# Patient Record
Sex: Female | Born: 1935 | Hispanic: No | Marital: Married | Smoking: Never smoker
Health system: Southern US, Community
[De-identification: ages and names within clinical notes are randomized; demographics above are authoritative.]

## PROBLEM LIST (undated history)

## (undated) DIAGNOSIS — Y92129 Unspecified place in nursing home as the place of occurrence of the external cause: Secondary | ICD-10-CM

## (undated) DIAGNOSIS — S069XAA Unspecified intracranial injury with loss of consciousness status unknown, initial encounter: Secondary | ICD-10-CM

## (undated) DIAGNOSIS — R001 Bradycardia, unspecified: Secondary | ICD-10-CM

## (undated) DIAGNOSIS — H409 Unspecified glaucoma: Secondary | ICD-10-CM

## (undated) DIAGNOSIS — M545 Low back pain, unspecified: Secondary | ICD-10-CM

## (undated) DIAGNOSIS — W19XXXA Unspecified fall, initial encounter: Secondary | ICD-10-CM

## (undated) DIAGNOSIS — K429 Umbilical hernia without obstruction or gangrene: Secondary | ICD-10-CM

## (undated) DIAGNOSIS — C439 Malignant melanoma of skin, unspecified: Secondary | ICD-10-CM

## (undated) DIAGNOSIS — I4891 Unspecified atrial fibrillation: Secondary | ICD-10-CM

## (undated) DIAGNOSIS — G629 Polyneuropathy, unspecified: Secondary | ICD-10-CM

## (undated) DIAGNOSIS — I10 Essential (primary) hypertension: Secondary | ICD-10-CM

## (undated) DIAGNOSIS — H9190 Unspecified hearing loss, unspecified ear: Secondary | ICD-10-CM

## (undated) DIAGNOSIS — E119 Type 2 diabetes mellitus without complications: Secondary | ICD-10-CM

## (undated) DIAGNOSIS — F329 Major depressive disorder, single episode, unspecified: Secondary | ICD-10-CM

## (undated) DIAGNOSIS — S069X9A Unspecified intracranial injury with loss of consciousness of unspecified duration, initial encounter: Secondary | ICD-10-CM

## (undated) DIAGNOSIS — F0390 Unspecified dementia without behavioral disturbance: Secondary | ICD-10-CM

## (undated) DIAGNOSIS — F32A Depression, unspecified: Secondary | ICD-10-CM

## (undated) DIAGNOSIS — Z973 Presence of spectacles and contact lenses: Secondary | ICD-10-CM

## (undated) DIAGNOSIS — M791 Myalgia, unspecified site: Secondary | ICD-10-CM

## (undated) HISTORY — DX: Bradycardia, unspecified: R00.1

## (undated) HISTORY — DX: Unspecified atrial fibrillation: I48.91

## (undated) HISTORY — DX: Umbilical hernia without obstruction or gangrene: K42.9

## (undated) HISTORY — DX: Low back pain, unspecified: M54.50

## (undated) HISTORY — DX: Low back pain: M54.5

## (undated) HISTORY — PX: SKIN CANCER EXCISION: SHX779

## (undated) HISTORY — DX: Myalgia, unspecified site: M79.10

## (undated) HISTORY — DX: Unspecified glaucoma: H40.9

## (undated) HISTORY — DX: Polyneuropathy, unspecified: G62.9

---

## 1958-01-22 DIAGNOSIS — C439 Malignant melanoma of skin, unspecified: Secondary | ICD-10-CM

## 1958-01-22 HISTORY — DX: Malignant melanoma of skin, unspecified: C43.9

## 1999-11-15 ENCOUNTER — Other Ambulatory Visit: Admission: RE | Admit: 1999-11-15 | Discharge: 1999-11-15 | Payer: Self-pay | Admitting: Family Medicine

## 2000-11-15 ENCOUNTER — Other Ambulatory Visit: Admission: RE | Admit: 2000-11-15 | Discharge: 2000-11-15 | Payer: Self-pay | Admitting: Family Medicine

## 2007-08-08 ENCOUNTER — Emergency Department (HOSPITAL_COMMUNITY): Admission: EM | Admit: 2007-08-08 | Discharge: 2007-08-09 | Payer: Self-pay | Admitting: Emergency Medicine

## 2011-12-06 ENCOUNTER — Other Ambulatory Visit (HOSPITAL_COMMUNITY): Payer: Self-pay

## 2011-12-06 DIAGNOSIS — R9431 Abnormal electrocardiogram [ECG] [EKG]: Secondary | ICD-10-CM

## 2011-12-06 DIAGNOSIS — I10 Essential (primary) hypertension: Secondary | ICD-10-CM

## 2011-12-11 ENCOUNTER — Ambulatory Visit (HOSPITAL_COMMUNITY): Payer: Self-pay

## 2012-01-01 ENCOUNTER — Emergency Department (HOSPITAL_COMMUNITY): Payer: Medicare Other

## 2012-01-01 ENCOUNTER — Emergency Department (HOSPITAL_COMMUNITY)
Admission: EM | Admit: 2012-01-01 | Discharge: 2012-01-01 | Disposition: A | Discharge status: Home / Self Care | Payer: Medicare Other | Attending: Emergency Medicine | Admitting: Emergency Medicine

## 2012-01-01 ENCOUNTER — Encounter (HOSPITAL_COMMUNITY): Payer: Self-pay

## 2012-01-01 DIAGNOSIS — W010XXA Fall on same level from slipping, tripping and stumbling without subsequent striking against object, initial encounter: Secondary | ICD-10-CM | POA: Insufficient documentation

## 2012-01-01 DIAGNOSIS — W19XXXA Unspecified fall, initial encounter: Secondary | ICD-10-CM

## 2012-01-01 DIAGNOSIS — Z859 Personal history of malignant neoplasm, unspecified: Secondary | ICD-10-CM | POA: Insufficient documentation

## 2012-01-01 DIAGNOSIS — E119 Type 2 diabetes mellitus without complications: Secondary | ICD-10-CM | POA: Insufficient documentation

## 2012-01-01 DIAGNOSIS — Y929 Unspecified place or not applicable: Secondary | ICD-10-CM | POA: Insufficient documentation

## 2012-01-01 DIAGNOSIS — I10 Essential (primary) hypertension: Secondary | ICD-10-CM | POA: Insufficient documentation

## 2012-01-01 DIAGNOSIS — Y9301 Activity, walking, marching and hiking: Secondary | ICD-10-CM | POA: Insufficient documentation

## 2012-01-01 DIAGNOSIS — D689 Coagulation defect, unspecified: Secondary | ICD-10-CM | POA: Insufficient documentation

## 2012-01-01 DIAGNOSIS — S0101XA Laceration without foreign body of scalp, initial encounter: Secondary | ICD-10-CM

## 2012-01-01 HISTORY — DX: Malignant melanoma of skin, unspecified: C43.9

## 2012-01-01 HISTORY — DX: Essential (primary) hypertension: I10

## 2012-01-01 HISTORY — DX: Type 2 diabetes mellitus without complications: E11.9

## 2012-01-01 MED ORDER — LIDOCAINE-EPINEPHRINE-TETRACAINE (LET) SOLUTION
3.0000 mL | Freq: Once | NASAL | Status: AC
  Administered 2012-01-01: 3 mL via TOPICAL
  Filled 2012-01-01: qty 3

## 2012-01-01 NOTE — ED Notes (Signed)
PTAR contacted 

## 2012-01-01 NOTE — ED Notes (Signed)
ZOX:[id number]<AV> Expected date:01/01/12<BR> Expected time: 4:02 PM<BR> Means of arrival:Ambulance<BR> Comments:<BR> 89yoF/Fall

## 2012-01-01 NOTE — ED Provider Notes (Signed)
History     CSN: [id number]  Arrival date & time 01/01/12  1613   First MD Initiated Contact with Patient 01/01/12 1743      Chief Complaint  Patient presents with  . Fall    (Consider location/radiation/quality/duration/timing/severity/associated sxs/prior treatment) HPI  Patient reports she was visiting her husband and  she was getting him some water and she tripped on her husband feet that were sticking off the end of a recliner when she walked by which made her lose her balance and she fell flat on her back. She states she hit her head but she did not have any loss of consciousness. She states she had a lot of bleeding because she is on xarelto. She denies any headache or any pain. They have been putting ice packs on it which has stopped the bleeding. She denies nausea, vomiting, blurred vision.  Last tetanus is less than 5 years ago  PCP Dr. Frederik Pear  Past Medical History  Diagnosis Date  . Diabetes mellitus without complication   . Hypertension   . Melanoma     History reviewed. No pertinent past surgical history.  No family history on file.  History  Substance Use Topics  . Smoking status: Never Smoker   . Smokeless tobacco: Never Used  . Alcohol Use: No   Lives in assisted living at [facility] for the past 2 months Uses a walker now b/o a fall and hip pain.    OB History    Grav Para Term Preterm Abortions TAB SAB Ect Mult Living                  Review of Systems  All other systems reviewed and are negative.    Allergies  Review of patient's allergies indicates not on file.  Home Medications   Previous Medications   HYDROCODONE-ACETAMINOPHEN (NORCO/VICODIN) 5-325 MG PER TABLET    Take 1 tablet by mouth every 4 (four) hours as needed. Severe pain   IBUPROFEN (ADVIL,MOTRIN) 200 MG TABLET    Take 200 mg by mouth every 6 (six) hours as needed. Pain   METOPROLOL SUCCINATE (TOPROL-XL) 25 MG 24 HR TABLET    Take 25 mg by mouth daily.  xarelto per  daughter  BP 158/83  Pulse 84  Temp 97.6 F (36.4 C) (Oral)  Resp 16  SpO2 96%  Vital signs normal except hypertension   Physical Exam  Nursing note and vitals reviewed. Constitutional: She is oriented to person, place, and time. She appears well-developed and well-nourished.  Non-toxic appearance. She does not appear ill. No distress.  HENT:  Head: Normocephalic.  Right Ear: External ear normal.  Left Ear: External ear normal.  Nose: Nose normal. No mucosal edema or rhinorrhea.  Mouth/Throat: Oropharynx is clear and moist and mucous membranes are normal. No dental abscesses or uvula swelling.       Patient has a half centimeter laceration of her posterior scalp with some blood in the hair. There is bruising surrounding the laceration. There is no active bleeding at this time.  Eyes: Conjunctivae normal and EOM are normal. Pupils are equal, round, and reactive to light.  Neck: Normal range of motion and full passive range of motion without pain. Neck supple.       Neck is nontender and she moves her head freely during conversation  Pulmonary/Chest: Effort normal. No respiratory distress. She has no rhonchi. She exhibits no tenderness and no crepitus.  Abdominal: Soft. Normal appearance and bowel sounds  are normal. There is no tenderness.  Musculoskeletal: Normal range of motion. She exhibits no edema and no tenderness.       Moves all extremities well. She has no pain on flexion extension of her knees, hips, elbows or shoulders. She has no pain in the long bones.  Neurological: She is alert and oriented to person, place, and time. She has normal strength. No cranial nerve deficit.  Skin: Skin is warm, dry and intact. No rash noted. No erythema. No pallor.  Psychiatric: She has a normal mood and affect. Her speech is normal and behavior is normal. Her mood appears not anxious.    ED Course  Procedures (including critical care time)   LACERATION REPAIR Performed by: Emonee Winkowski  L Authorized by: Ward Givens Consent: Verbal consent obtained. Risks and benefits: risks, benefits and alternatives were discussed Consent given by: patient Patient identity confirmed: provided demographic data Prepped and Draped in normal sterile fashion Wound explored  Laceration Location:  Posterior scalp  Laceration Length: 0.5 cm  No Foreign Bodies seen or palpated  LET applied   Amount of cleaning: standard  Skin closure: staple  Number of sutures: 1    Patient tolerance: Patient tolerated the procedure well with no immediate complications.   Ct Head Wo Contrast  01/01/2012  *RADIOLOGY REPORT*  Clinical Data: Fall.  Occipital abrasion.  CT HEAD WITHOUT CONTRAST  Technique:  Contiguous axial images were obtained from the base of the skull through the vertex without contrast.  Comparison: 08/08/2007.  Findings: Midline and left parietal scalp hematoma is present. There is no underlying skull fracture.  The No mass lesion, mass effect, midline shift, hydrocephalus, hemorrhage.  No acute territorial cortical ischemia/infarct. Atrophy and chronic ischemic white matter disease is present.  Severe intracranial atherosclerosis.  Calvarium intact.  Paranasal sinuses are within normal limits.  IMPRESSION:  1.  Atrophy and chronic ischemic white matter disease without acute intracranial abnormality. 2.  Midline and left parietal scalp hematoma and contusion.   Original Report Authenticated By: Andreas Newport, M.D.      1. Fall   2. Laceration of scalp   3. Coagulopathy    Plan discharge  Devoria Albe, MD, FACEP   MDM          Ward Givens, MD 01/01/12 2045

## 2012-01-01 NOTE — ED Notes (Signed)
Pt BIB EMS. Pt is from [facility]. Pt was downstairs visiting her husband and tripped and fell and struck the back her head. Pt denies LOC, neck or back pain. Pt a/o x 3 to baseline. Pt has small lac to back of head. Bleeding controlled. Minimal swelling to area. Pt ambulated to gurney.

## 2012-03-08 ENCOUNTER — Encounter (HOSPITAL_COMMUNITY): Payer: Self-pay

## 2012-03-08 ENCOUNTER — Emergency Department (HOSPITAL_COMMUNITY): Payer: Medicare Other

## 2012-03-08 ENCOUNTER — Inpatient Hospital Stay (HOSPITAL_COMMUNITY): Payer: Medicare Other

## 2012-03-08 ENCOUNTER — Inpatient Hospital Stay (HOSPITAL_COMMUNITY)
Admission: EM | Admit: 2012-03-08 | Discharge: 2012-03-17 | DRG: 085 | Disposition: A | Discharge status: Skilled Nursing Facility | Payer: Medicare Other | Attending: Family Medicine | Admitting: Family Medicine

## 2012-03-08 DIAGNOSIS — Z9181 History of falling: Secondary | ICD-10-CM

## 2012-03-08 DIAGNOSIS — E119 Type 2 diabetes mellitus without complications: Secondary | ICD-10-CM | POA: Diagnosis present

## 2012-03-08 DIAGNOSIS — G9349 Other encephalopathy: Secondary | ICD-10-CM | POA: Diagnosis present

## 2012-03-08 DIAGNOSIS — F0390 Unspecified dementia without behavioral disturbance: Secondary | ICD-10-CM | POA: Diagnosis present

## 2012-03-08 DIAGNOSIS — N289 Disorder of kidney and ureter, unspecified: Secondary | ICD-10-CM | POA: Diagnosis present

## 2012-03-08 DIAGNOSIS — I6200 Nontraumatic subdural hemorrhage, unspecified: Secondary | ICD-10-CM

## 2012-03-08 DIAGNOSIS — E860 Dehydration: Secondary | ICD-10-CM | POA: Diagnosis present

## 2012-03-08 DIAGNOSIS — IMO0002 Reserved for concepts with insufficient information to code with codable children: Secondary | ICD-10-CM | POA: Diagnosis present

## 2012-03-08 DIAGNOSIS — R4182 Altered mental status, unspecified: Secondary | ICD-10-CM

## 2012-03-08 DIAGNOSIS — Z79899 Other long term (current) drug therapy: Secondary | ICD-10-CM

## 2012-03-08 DIAGNOSIS — S0003XA Contusion of scalp, initial encounter: Secondary | ICD-10-CM | POA: Diagnosis present

## 2012-03-08 DIAGNOSIS — S065X9A Traumatic subdural hemorrhage with loss of consciousness of unspecified duration, initial encounter: Secondary | ICD-10-CM | POA: Diagnosis present

## 2012-03-08 DIAGNOSIS — Y92129 Unspecified place in nursing home as the place of occurrence of the external cause: Secondary | ICD-10-CM

## 2012-03-08 DIAGNOSIS — I10 Essential (primary) hypertension: Secondary | ICD-10-CM | POA: Diagnosis present

## 2012-03-08 DIAGNOSIS — S065X0A Traumatic subdural hemorrhage without loss of consciousness, initial encounter: Principal | ICD-10-CM | POA: Diagnosis present

## 2012-03-08 DIAGNOSIS — E876 Hypokalemia: Secondary | ICD-10-CM | POA: Diagnosis present

## 2012-03-08 DIAGNOSIS — Y9210 Unspecified residential institution as the place of occurrence of the external cause: Secondary | ICD-10-CM | POA: Diagnosis present

## 2012-03-08 DIAGNOSIS — Z66 Do not resuscitate: Secondary | ICD-10-CM | POA: Diagnosis present

## 2012-03-08 DIAGNOSIS — S0180XA Unspecified open wound of other part of head, initial encounter: Secondary | ICD-10-CM | POA: Diagnosis present

## 2012-03-08 DIAGNOSIS — W19XXXA Unspecified fall, initial encounter: Secondary | ICD-10-CM

## 2012-03-08 DIAGNOSIS — F0781 Postconcussional syndrome: Secondary | ICD-10-CM | POA: Diagnosis present

## 2012-03-08 HISTORY — DX: Unspecified dementia, unspecified severity, without behavioral disturbance, psychotic disturbance, mood disturbance, and anxiety: F03.90

## 2012-03-08 LAB — CBC WITH DIFFERENTIAL/PLATELET
Eosinophils Absolute: 0 10*3/uL (ref 0.0–0.7)
Eosinophils Relative: 0 % (ref 0–5)
Lymphs Abs: 0.6 10*3/uL — ABNORMAL LOW (ref 0.7–4.0)
MCH: 28.8 pg (ref 26.0–34.0)
MCV: 83.3 fL (ref 78.0–100.0)
Monocytes Relative: 5 % (ref 3–12)
Platelets: 179 10*3/uL (ref 150–400)
RBC: 4.97 MIL/uL (ref 3.87–5.11)

## 2012-03-08 LAB — URINALYSIS, ROUTINE W REFLEX MICROSCOPIC
Ketones, ur: 15 mg/dL — AB
Leukocytes, UA: NEGATIVE
Nitrite: NEGATIVE
Protein, ur: 100 mg/dL — AB

## 2012-03-08 LAB — PROTIME-INR: Prothrombin Time: 12.3 seconds (ref 11.6–15.2)

## 2012-03-08 LAB — COMPREHENSIVE METABOLIC PANEL
BUN: 12 mg/dL (ref 6–23)
Calcium: 10.3 mg/dL (ref 8.4–10.5)
GFR calc Af Amer: >90 mL/min (ref >90)
Glucose, Bld: 178 mg/dL — ABNORMAL HIGH (ref 70–99)
Sodium: 131 mEq/L — ABNORMAL LOW (ref 135–145)
Total Protein: 7.5 g/dL (ref 6.0–8.3)

## 2012-03-08 LAB — APTT: aPTT: 23 seconds — ABNORMAL LOW (ref 24–37)

## 2012-03-08 MED ORDER — POTASSIUM CHLORIDE 10 MEQ/100ML IV SOLN
10.0000 meq | INTRAVENOUS | Status: AC
  Administered 2012-03-08 (×2): 10 meq via INTRAVENOUS
  Filled 2012-03-08 (×2): qty 100

## 2012-03-08 MED ORDER — SODIUM CHLORIDE 0.9 % IV SOLN
INTRAVENOUS | Status: DC
  Administered 2012-03-08 – 2012-03-10 (×2): via INTRAVENOUS

## 2012-03-08 MED ORDER — ACETAMINOPHEN 325 MG PO TABS: 650.0000 mg | ORAL_TABLET | Freq: Four times a day (QID) | ORAL | Status: DC | PRN

## 2012-03-08 MED ORDER — ONDANSETRON HCL 4 MG PO TABS: 4.0000 mg | ORAL_TABLET | Freq: Four times a day (QID) | ORAL | Status: DC | PRN

## 2012-03-08 MED ORDER — ONDANSETRON HCL 4 MG/2ML IJ SOLN
4.0000 mg | Freq: Four times a day (QID) | INTRAMUSCULAR | Status: DC | PRN
  Administered 2012-03-08 – 2012-03-09 (×2): 4 mg via INTRAVENOUS
  Filled 2012-03-08 (×2): qty 2

## 2012-03-08 MED ORDER — NICARDIPINE HCL IN NACL 20-0.86 MG/200ML-% IV SOLN
5.0000 mg/h | INTRAVENOUS | Status: DC
  Administered 2012-03-08: 5 mg/h via INTRAVENOUS
  Filled 2012-03-08: qty 200

## 2012-03-08 MED ORDER — HYDROMORPHONE HCL PF 1 MG/ML IJ SOLN: 1.0000 mg | INTRAMUSCULAR | Status: DC | PRN

## 2012-03-08 MED ORDER — HYDRALAZINE HCL 20 MG/ML IJ SOLN
10.0000 mg | Freq: Four times a day (QID) | INTRAMUSCULAR | Status: DC | PRN
  Administered 2012-03-08 – 2012-03-12 (×4): 10 mg via INTRAVENOUS
  Filled 2012-03-08: qty 1
  Filled 2012-03-08: qty 0.5
  Filled 2012-03-08 (×2): qty 1
  Filled 2012-03-08: qty 0.5

## 2012-03-08 MED ORDER — LABETALOL HCL 5 MG/ML IV SOLN
INTRAVENOUS | Status: AC
  Administered 2012-03-08: 10 mg via INTRAVENOUS
  Filled 2012-03-08: qty 4

## 2012-03-08 MED ORDER — LABETALOL HCL 5 MG/ML IV SOLN
10.0000 mg | INTRAVENOUS | Status: DC | PRN
Start: 2012-03-08 — End: 2012-03-10
  Administered 2012-03-08 – 2012-03-09 (×2): 10 mg via INTRAVENOUS
  Filled 2012-03-08: qty 4

## 2012-03-08 MED ORDER — ACETAMINOPHEN 650 MG RE SUPP
650.0000 mg | Freq: Four times a day (QID) | RECTAL | Status: DC | PRN
  Administered 2012-03-09: 650 mg via RECTAL
  Filled 2012-03-08: qty 1

## 2012-03-08 MED ORDER — ONDANSETRON HCL 4 MG/2ML IJ SOLN
INTRAMUSCULAR | Status: AC
  Administered 2012-03-08: 4 mg via INTRAVENOUS
  Filled 2012-03-08: qty 2

## 2012-03-08 MED ORDER — ALUM & MAG HYDROXIDE-SIMETH 200-200-20 MG/5ML PO SUSP: 30.0000 mL | Freq: Four times a day (QID) | ORAL | Status: DC | PRN

## 2012-03-08 NOTE — ED Notes (Addendum)
This Rn transported pt to MRI and stayed with pt. Pt returned at this time from MRI, pt is not speaking at this time, will only answer "yeah." Daughter at bedside, pt does not recognize. Pt not following commands. Bp also noted to be >200. MD paged. Will not transport pt to stepdown at this time without MD approval. Pt vomited 1x while being transported back from MRI.

## 2012-03-08 NOTE — ED Notes (Signed)
Patient transported to MRI 

## 2012-03-08 NOTE — ED Notes (Signed)
Pt is more oriented than previous exam. Pt recognized her daughter. Pt answering questions with less confusion.

## 2012-03-08 NOTE — ED Provider Notes (Signed)
History  This chart was scribed for Teresa Racer, MD by Bennett Scrape, ED Scribe. This patient was seen in room A02C/A02C and the patient's care was started at 1:23 PM.  CSN: [id number]  Arrival date & time 03/08/12  1320   First MD Initiated Contact with Patient 03/08/12 1323     Level 5 Caveat- AMS  Chief Complaint  Patient presents with  . Fall    Patient is a 79 y.o. female presenting with fall. The history is provided by the patient. No language interpreter was used.  Fall The accident occurred 1 to 2 hours ago. The fall occurred while walking. Distance fallen: ground floor. The point of impact was the head. There was no entrapment after the fall. There was no drug use involved in the accident. There was no alcohol use involved in the accident. Treatment on scene includes a c-collar and a backboard.    Teresa Richards is a 79 y.o. female brought in by ambulance from [facility], who presents to the Emergency Department complaining of AMS after a fall. SNF reported to EMS that the pt is typically alert and oriented. They state that the pt had a ground level fall today with an associated laceration to the left eye. One hour after the fall, staff found the pt confused, agitated, not oriented and hypertensive. She has been hypertensive for EMS. Upon arrival to the ED, BP is 189/99 and the pt is combative and agitated. She is unable to provide any further history. She has a h/o DM and HTN and denies smoking and alcohol use.  Past Medical History  Diagnosis Date  . Diabetes mellitus without complication   . Hypertension   . Melanoma     No past surgical history on file.  No family history on file.  History  Substance Use Topics  . Smoking status: Never Smoker   . Smokeless tobacco: Never Used  . Alcohol Use: No    No OB history provided.  Review of Systems  Unable to perform ROS: Mental status change    Allergies  Review of patient's allergies indicates not on  file.  Home Medications   Current Outpatient Rx  Name  Route  Sig  Dispense  Refill  . HYDROcodone-acetaminophen (NORCO/VICODIN) 5-325 MG per tablet   Oral   Take 1 tablet by mouth every 4 (four) hours as needed. Severe pain         . ibuprofen (ADVIL,MOTRIN) 200 MG tablet   Oral   Take 200 mg by mouth every 6 (six) hours as needed. Pain         . metoprolol succinate (TOPROL-XL) 25 MG 24 hr tablet   Oral   Take 25 mg by mouth daily.           Triage Vitals: BP 188/99  Pulse 86  Temp(Src) 97.8 F (36.6 C) (Oral)  Resp 20  SpO2 96%  Physical Exam  Nursing note and vitals reviewed. Constitutional: She appears well-developed and well-nourished. No distress.  HENT:  Head: Normocephalic.  Stellate laceration to the lateral oribt on the left  Eyes: Conjunctivae and EOM are normal. Pupils are equal, round, and reactive to light.  Neck: No tracheal deviation present.  C-collar in place  Cardiovascular: Normal rate and regular rhythm.   Pulmonary/Chest: Effort normal and breath sounds normal. No respiratory distress.  Abdominal: Soft. There is no tenderness.  Musculoskeletal: Normal range of motion.  Hematoma on dorsum of left hand which could be from trying  to start an IV   Neurological: She is alert.  Pt is moving all extremities, oriented to person but not place or time, currently trying to remove the c-collar, difficult to direct  Skin: Skin is warm and dry.  Psychiatric: She has a normal mood and affect. She is agitated.    ED Course  Procedures (including critical care time)  DIAGNOSTIC STUDIES: Oxygen Saturation is 96% on room air, adequate by my interpretation.    COORDINATION OF CARE: 1:26 PM- Pt is actively trying to pull the c-collar off. Will order CT of head, face and cervical spine and CBC.  1:30 PM-Spoke with pt's daughter who states that she last saw the pt at baseline on 3 days ago. Pt does not currently recognize daughter which is an acute  change.  2:59 PM-Consult complete with Dr. Magnus Ivan, trauma. Patient case explained and discussed. Dr. Magnus Ivan agrees to admit patient for further evaluation and treatment to medicine. Call ended at 3:00 PM.  3:18 PM-Consult complete with Dr. Phoebe Perch, neurosurgery. Patient case explained and discussed. Advised to bring pt's systolic BP to 165 and he will evaluate in the ED. Will start Cardene IV drip. Call ended at 3:19 PM.  3:28 PM-Consult complete with Dr. Jonette Eva, hospitalist. Patient case explained and discussed. Advised to speak to critical care to admit patient for further evaluation and treatment. Call ended at 3:29 PM.  3:45 PM-Consult complete with Dr. Broadus John, critical care. Patient case explained and discussed. Dr. Broadus John agrees to my plan and will evaluate the pt in the ED. Call ended at 3:46 PM.  Labs Reviewed  CBC WITH DIFFERENTIAL - Abnormal; Notable for the following:    Neutrophils Relative 84 (*)    Lymphocytes Relative 10 (*)    Lymphs Abs 0.6 (*)    All other components within normal limits  COMPREHENSIVE METABOLIC PANEL  PROTIME-INR  APTT  URINALYSIS, ROUTINE W REFLEX MICROSCOPIC   Ct Head Wo Contrast  03/08/2012  *RADIOLOGY REPORT*  Clinical Data:  78 year old female with head, face and neck injury following fall.  Headache, neck pain and facial pain and swelling. Altered mental status and confusion.  CT HEAD WITHOUT CONTRAST CT MAXILLOFACIAL WITHOUT CONTRAST CT CERVICAL SPINE WITHOUT CONTRAST  Technique:  Multidetector CT imaging of the head, cervical spine, and maxillofacial structures were performed using the standard protocol without intravenous contrast. Multiplanar CT image reconstructions of the cervical spine and maxillofacial structures were also generated.  Comparison:  01/01/2012 and 08/08/2007 CTs  CT HEAD  Findings: A left frontotemporal/anterior left parafalcine subdural hematoma is identified measuring 5 mm in greatest diameter. 2 mm left to right midline  shift is noted.  Chronic small vessel white matter ischemic changes are noted with heavy vascular calcifications.  There is no evidence of acute infarction, hydrocephalus or mass lesion. No acute or suspicious bony abnormalities are noted.  IMPRESSION: Left frontotemporal/anterior left parafalcine subdural hematoma measuring 5 mm in greatest diameter with 2 mm left to right midline shift.  Chronic small vessel white matter ischemic changes.  CT MAXILLOFACIAL  Findings:  Left facial soft tissue swelling is identified. There is no evidence of acute fracture, subluxation or dislocation. The paranasal sinuses, mastoid air cells and middle/. The orbits and globes are unremarkable. No focal bony lesions are present.  IMPRESSION: Left facial soft tissue swelling without fracture.  CT CERVICAL SPINE  Findings:   Motion artifact degrades images at the C7-T1 articulation.  There is no evidence of acute fracture or subluxation. No prevertebral soft  tissue swelling is noted. Moderate degenerative disc disease and spondylosis from C4-T1 noted. Mild to moderate facet arthropathy throughout the cervical spine identified. No soft tissue abnormalities are identified.  IMPRESSION: No static evidence of acute injury to the spine.  Please note that motion artifact slightly limits evaluation.  Multilevel degenerative changes.  Critical Value/emergent results were called by telephone at the time of interpretation on 03/08/2012 at 2:30 p.m. to Dr. Ranae Palms, who verbally acknowledged these results.   Original Report Authenticated By: Harmon Pier, M.D.    Ct Cervical Spine Wo Contrast  03/08/2012  *RADIOLOGY REPORT*  Clinical Data:  79 year old female with head, face and neck injury following fall.  Headache, neck pain and facial pain and swelling. Altered mental status and confusion.  CT HEAD WITHOUT CONTRAST CT MAXILLOFACIAL WITHOUT CONTRAST CT CERVICAL SPINE WITHOUT CONTRAST  Technique:  Multidetector CT imaging of the head, cervical  spine, and maxillofacial structures were performed using the standard protocol without intravenous contrast. Multiplanar CT image reconstructions of the cervical spine and maxillofacial structures were also generated.  Comparison:  01/01/2012 and 08/08/2007 CTs  CT HEAD  Findings: A left frontotemporal/anterior left parafalcine subdural hematoma is identified measuring 5 mm in greatest diameter. 2 mm left to right midline shift is noted.  Chronic small vessel white matter ischemic changes are noted with heavy vascular calcifications.  There is no evidence of acute infarction, hydrocephalus or mass lesion. No acute or suspicious bony abnormalities are noted.  IMPRESSION: Left frontotemporal/anterior left parafalcine subdural hematoma measuring 5 mm in greatest diameter with 2 mm left to right midline shift.  Chronic small vessel white matter ischemic changes.  CT MAXILLOFACIAL  Findings:  Left facial soft tissue swelling is identified. There is no evidence of acute fracture, subluxation or dislocation. The paranasal sinuses, mastoid air cells and middle/. The orbits and globes are unremarkable. No focal bony lesions are present.  IMPRESSION: Left facial soft tissue swelling without fracture.  CT CERVICAL SPINE  Findings:   Motion artifact degrades images at the C7-T1 articulation.  There is no evidence of acute fracture or subluxation. No prevertebral soft tissue swelling is noted. Moderate degenerative disc disease and spondylosis from C4-T1 noted. Mild to moderate facet arthropathy throughout the cervical spine identified. No soft tissue abnormalities are identified.  IMPRESSION: No static evidence of acute injury to the spine.  Please note that motion artifact slightly limits evaluation.  Multilevel degenerative changes.  Critical Value/emergent results were called by telephone at the time of interpretation on 03/08/2012 at 2:30 p.m. to Dr. Ranae Palms, who verbally acknowledged these results.   Original Report  Authenticated By: Harmon Pier, M.D.    Ct Maxillofacial Wo Cm  03/08/2012  *RADIOLOGY REPORT*  Clinical Data:  79 year old female with head, face and neck injury following fall.  Headache, neck pain and facial pain and swelling. Altered mental status and confusion.  CT HEAD WITHOUT CONTRAST CT MAXILLOFACIAL WITHOUT CONTRAST CT CERVICAL SPINE WITHOUT CONTRAST  Technique:  Multidetector CT imaging of the head, cervical spine, and maxillofacial structures were performed using the standard protocol without intravenous contrast. Multiplanar CT image reconstructions of the cervical spine and maxillofacial structures were also generated.  Comparison:  01/01/2012 and 08/08/2007 CTs  CT HEAD  Findings: A left frontotemporal/anterior left parafalcine subdural hematoma is identified measuring 5 mm in greatest diameter. 2 mm left to right midline shift is noted.  Chronic small vessel white matter ischemic changes are noted with heavy vascular calcifications.  There is no evidence of acute infarction,  hydrocephalus or mass lesion. No acute or suspicious bony abnormalities are noted.  IMPRESSION: Left frontotemporal/anterior left parafalcine subdural hematoma measuring 5 mm in greatest diameter with 2 mm left to right midline shift.  Chronic small vessel white matter ischemic changes.  CT MAXILLOFACIAL  Findings:  Left facial soft tissue swelling is identified. There is no evidence of acute fracture, subluxation or dislocation. The paranasal sinuses, mastoid air cells and middle/. The orbits and globes are unremarkable. No focal bony lesions are present.  IMPRESSION: Left facial soft tissue swelling without fracture.  CT CERVICAL SPINE  Findings:   Motion artifact degrades images at the C7-T1 articulation.  There is no evidence of acute fracture or subluxation. No prevertebral soft tissue swelling is noted. Moderate degenerative disc disease and spondylosis from C4-T1 noted. Mild to moderate facet arthropathy throughout the  cervical spine identified. No soft tissue abnormalities are identified.  IMPRESSION: No static evidence of acute injury to the spine.  Please note that motion artifact slightly limits evaluation.  Multilevel degenerative changes.  Critical Value/emergent results were called by telephone at the time of interpretation on 03/08/2012 at 2:30 p.m. to Dr. Ranae Palms, who verbally acknowledged these results.   Original Report Authenticated By: Harmon Pier, M.D.      No diagnosis found.    Date: 03/08/2012  Rate: 84  Rhythm: normal sinus rhythm  QRS Axis: normal  Intervals: normal  ST/T Wave abnormalities: nonspecific T wave changes  Conduction Disutrbances:none  Narrative Interpretation:   Old EKG Reviewed: none available  CRITICAL CARE Performed by: Ranae Palms, Genevive Printup   Total critical care time: 30  Critical care time was exclusive of separately billable procedures and treating other patients.  Critical care was necessary to treat or prevent imminent or life-threatening deterioration.  Critical care was time spent personally by me on the following activities: development of treatment plan with patient and/or surrogate as well as nursing, discussions with consultants, evaluation of patient's response to treatment, examination of patient, obtaining history from patient or surrogate, ordering and performing treatments and interventions, ordering and review of laboratory studies, ordering and review of radiographic studies, pulse oximetry and re-evaluation of patient's condition.   MDM  I personally performed the services described in this documentation, which was scribed in my presence. The recorded information has been reviewed and is accurate.    Teresa Racer, MD 03/08/12 [phone]

## 2012-03-08 NOTE — ED Notes (Signed)
Pt's rings removed from left ring finger and placed in possession of her daughter.

## 2012-03-08 NOTE — ED Provider Notes (Signed)
LACERATION REPAIR Performed by: Glade Nurse Authorized by: Glade Nurse Consent: Verbal consent obtained. Risks and benefits: risks, benefits and alternatives were discussed Consent given by: patient Patient identity confirmed: provided demographic data Prepped and Draped in normal sterile fashion Wound explored  Laceration Location: lateral to left eye  Laceration Length: 3cm, 1cm  No Foreign Bodies seen or palpated  Anesthesia: local infiltration  Local anesthetic: lidocaine 2% without epinephrine  Anesthetic total: 4 ml  Irrigation method: syringe Amount of cleaning: standard  Skin closure: 5-0 prolene  Number of sutures: 3, 1  Technique: simple interrupted  Patient tolerance: Patient tolerated the procedure well with no immediate complications.   [facility] Nurse, PA-C 03/08/12 [phone]

## 2012-03-08 NOTE — Consult Note (Addendum)
Referring Physician: ED    Chief Complaint: altered mental status status post falls earlier today.   HPI:                                                                                                                                         Teresa Richards is an 79 y.o. female with a past medical history significant for hypertension, diabetes, early dementia with recurrent falls since last November, resident of an assisted living facility, brought to the ED after sustaining 2 falls and becoming less responsive today. Patient is not able to provide clinical information and thus the history is gathered from her daughter and chart review. According to patient's daughter, Teresa Richards is becoming forgetful and confused but able to function relatively well at the assisted living. She said that she had a normal conversation with her mother around 9 am today, " but things changed drastically she she fell today". She reports that her mother was not able to recognize her when she arrived to the ED. She was described as " combative and agitated with BP 189/99" at the time of initial assessment in the ED A CT brain performed in the ED revealed an acute left frontotemporal/anterior left parafalcine subdural  hematoma measuring 5 mm in greatest diameter, with  2 mm left to right midline shift. Subsequent MRI brain was technically limited due to patient's difficulty remaining motionless but seems to reveal a slight increased in size of such hematoma without significant mass effect.  Nursing staff indicated that at some point she was able to follow commands and was only disoriented to time and place,  but has been less responsive, unable to follow commands since approximately 6 pm today. She did vomit after returning from MRI.  [name] blood pressure was elevated at 218/93, she was placed on a Cardene drip which was weaned off.   LSN: 9 am today. tPA Given: SDH, and thus she is not a tpa candidate for thrombolytic  therapy.  Past Medical History  Diagnosis Date  . Diabetes mellitus without complication   . Hypertension   . Melanoma     No past surgical history on file.  No family history on file. Social History:  reports that she has never smoked. She has never used smokeless tobacco. She reports that she does not drink alcohol or use illicit drugs.  Allergies:  Allergies  Allergen Reactions  . Codeine Other (See Comments)    Per MAR    Medications:  I have reviewed the patient's current medications.  ROS: unable to obtain due to patient's altered mental status.                                                                                       History obtained from chart review and patient's daughter.   Physical exam: lethargic. Blood pressure 192/97, pulse 97, temperature 97.8 F (36.6 C), temperature source Oral, resp. rate 25, SpO2 97.00%. Head: normocephalic. Face: trauma left periorbital area. Neck: supple, no bruits, no JVD. Cardiac: no murmurs. Lungs: clear. Abdomen: soft, no tender, no mass. Extremities: no edema.   Neurologic Examination: limited due to patient's mental status.                                                                                    Mental Status: Lethargic but open eyes to verbal commands. She seems to be dysarthric and can not follow commands.  Cranial Nerves:  Pupils measure 4 mm bilaterally, with good reactivity to light. No gaze preference. Extraocular movements are preserved and she has no evidence of nystagmus. Blinks to threat. Face seems to be symmetric. Tongue is midline.  Motor: She moves upper and lower extremities spontaneously and symmetrically. Sensory: withdraws to noxious stimuli. Deep Tendon Reflexes: 2+ and symmetric throughout Plantars: Right: downgoing   Left: upgoing Cerebellar: Unable to  test. Gait: unable to test. CV: pulses palpable throughout    Results for orders placed during the hospital encounter of 03/08/12 (from the past 48 hour(s))  CBC WITH DIFFERENTIAL     Status: Abnormal   Collection Time    03/08/12  2:20 PM      Result Value Range   WBC 5.6  4.0 - 10.5 K/uL   RBC 4.97  3.87 - 5.11 MIL/uL   Hemoglobin 14.3  12.0 - 15.0 g/dL   HCT 54.0  98.1 - 19.1 %   MCV 83.3  78.0 - 100.0 fL   MCH 28.8  26.0 - 34.0 pg   MCHC 34.5  30.0 - 36.0 g/dL   RDW 47.8  29.5 - 62.1 %   Platelets 179  150 - 400 K/uL   Neutrophils Relative 84 (*) 43 - 77 %   Neutro Abs 4.7  1.7 - 7.7 K/uL   Lymphocytes Relative 10 (*) 12 - 46 %   Lymphs Abs 0.6 (*) 0.7 - 4.0 K/uL   Monocytes Relative 5  3 - 12 %   Monocytes Absolute 0.3  0.1 - 1.0 K/uL   Eosinophils Relative 0  0 - 5 %   Eosinophils Absolute 0.0  0.0 - 0.7 K/uL   Basophils Relative 0  0 - 1 %   Basophils Absolute 0.0  0.0 - 0.1 K/uL  COMPREHENSIVE METABOLIC PANEL     Status: Abnormal   Collection Time  03/08/12  2:20 PM      Result Value Range   Sodium 131 (*) 135 - 145 mEq/L   Potassium 3.1 (*) 3.5 - 5.1 mEq/L   Chloride 94 (*) 96 - 112 mEq/L   CO2 25  19 - 32 mEq/L   Glucose, Bld 178 (*) 70 - 99 mg/dL   BUN 12  6 - 23 mg/dL   Creatinine, Ser 1.61  0.50 - 1.10 mg/dL   Calcium 09.6  8.4 - 04.5 mg/dL   Total Protein 7.5  6.0 - 8.3 g/dL   Albumin 4.2  3.5 - 5.2 g/dL   AST 21  0 - 37 U/L   ALT 14  0 - 35 U/L   Alkaline Phosphatase 83  39 - 117 U/L   Total Bilirubin 0.5  0.3 - 1.2 mg/dL   GFR calc non Af Amer 82 (*) >90 mL/min   GFR calc Af Amer >90  >90 mL/min   Comment:            The eGFR has been calculated     using the CKD EPI equation.     This calculation has not been     validated in all clinical     situations.     eGFR's persistently     <90 mL/min signify     possible Chronic Kidney Disease.  PROTIME-INR     Status: None   Collection Time    03/08/12  2:20 PM      Result Value Range    Prothrombin Time 12.3  11.6 - 15.2 seconds   INR 0.92  0.00 - 1.49  APTT     Status: Abnormal   Collection Time    03/08/12  2:20 PM      Result Value Range   aPTT 23 (*) 24 - 37 seconds  URINALYSIS, ROUTINE W REFLEX MICROSCOPIC     Status: Abnormal   Collection Time    03/08/12  3:31 PM      Result Value Range   Color, Urine YELLOW  YELLOW   APPearance CLEAR  CLEAR   Specific Gravity, Urine 1.011  1.005 - 1.030   pH 8.0  5.0 - 8.0   Glucose, UA 100 (*) NEGATIVE mg/dL   Hgb urine dipstick SMALL (*) NEGATIVE   Bilirubin Urine NEGATIVE  NEGATIVE   Ketones, ur 15 (*) NEGATIVE mg/dL   Protein, ur 409 (*) NEGATIVE mg/dL   Urobilinogen, UA 0.2  0.0 - 1.0 mg/dL   Nitrite NEGATIVE  NEGATIVE   Leukocytes, UA NEGATIVE  NEGATIVE  URINE MICROSCOPIC-ADD ON     Status: None   Collection Time    03/08/12  3:31 PM      Result Value Range   Squamous Epithelial / LPF RARE  RARE   RBC / HPF 0-2  <3 RBC/hpf   Urine-Other AMORPHOUS URATES/PHOSPHATES     Ct Head Wo Contrast  03/08/2012  *RADIOLOGY REPORT*  Clinical Data:  79 year old female with head, face and neck injury following fall.  Headache, neck pain and facial pain and swelling. Altered mental status and confusion.  CT HEAD WITHOUT CONTRAST CT MAXILLOFACIAL WITHOUT CONTRAST CT CERVICAL SPINE WITHOUT CONTRAST  Technique:  Multidetector CT imaging of the head, cervical spine, and maxillofacial structures were performed using the standard protocol without intravenous contrast. Multiplanar CT image reconstructions of the cervical spine and maxillofacial structures were also generated.  Comparison:  01/01/2012 and 08/08/2007 CTs  CT HEAD  Findings: A left frontotemporal/anterior  left parafalcine subdural hematoma is identified measuring 5 mm in greatest diameter. 2 mm left to right midline shift is noted.  Chronic small vessel white matter ischemic changes are noted with heavy vascular calcifications.  There is no evidence of acute infarction,  hydrocephalus or mass lesion. No acute or suspicious bony abnormalities are noted.  IMPRESSION: Left frontotemporal/anterior left parafalcine subdural hematoma measuring 5 mm in greatest diameter with 2 mm left to right midline shift.  Chronic small vessel white matter ischemic changes.  CT MAXILLOFACIAL  Findings:  Left facial soft tissue swelling is identified. There is no evidence of acute fracture, subluxation or dislocation. The paranasal sinuses, mastoid air cells and middle/. The orbits and globes are unremarkable. No focal bony lesions are present.  IMPRESSION: Left facial soft tissue swelling without fracture.  CT CERVICAL SPINE  Findings:   Motion artifact degrades images at the C7-T1 articulation.  There is no evidence of acute fracture or subluxation. No prevertebral soft tissue swelling is noted. Moderate degenerative disc disease and spondylosis from C4-T1 noted. Mild to moderate facet arthropathy throughout the cervical spine identified. No soft tissue abnormalities are identified.  IMPRESSION: No static evidence of acute injury to the spine.  Please note that motion artifact slightly limits evaluation.  Multilevel degenerative changes.  Critical Value/emergent results were called by telephone at the time of interpretation on 03/08/2012 at 2:30 p.m. to Dr. Ranae Palms, who verbally acknowledged these results.   Original Report Authenticated By: Harmon Pier, M.D.    Ct Cervical Spine Wo Contrast  03/08/2012  *RADIOLOGY REPORT*  Clinical Data:  79 year old female with head, face and neck injury following fall.  Headache, neck pain and facial pain and swelling. Altered mental status and confusion.  CT HEAD WITHOUT CONTRAST CT MAXILLOFACIAL WITHOUT CONTRAST CT CERVICAL SPINE WITHOUT CONTRAST  Technique:  Multidetector CT imaging of the head, cervical spine, and maxillofacial structures were performed using the standard protocol without intravenous contrast. Multiplanar CT image reconstructions of the  cervical spine and maxillofacial structures were also generated.  Comparison:  01/01/2012 and 08/08/2007 CTs  CT HEAD  Findings: A left frontotemporal/anterior left parafalcine subdural hematoma is identified measuring 5 mm in greatest diameter. 2 mm left to right midline shift is noted.  Chronic small vessel white matter ischemic changes are noted with heavy vascular calcifications.  There is no evidence of acute infarction, hydrocephalus or mass lesion. No acute or suspicious bony abnormalities are noted.  IMPRESSION: Left frontotemporal/anterior left parafalcine subdural hematoma measuring 5 mm in greatest diameter with 2 mm left to right midline shift.  Chronic small vessel white matter ischemic changes.  CT MAXILLOFACIAL  Findings:  Left facial soft tissue swelling is identified. There is no evidence of acute fracture, subluxation or dislocation. The paranasal sinuses, mastoid air cells and middle/. The orbits and globes are unremarkable. No focal bony lesions are present.  IMPRESSION: Left facial soft tissue swelling without fracture.  CT CERVICAL SPINE  Findings:   Motion artifact degrades images at the C7-T1 articulation.  There is no evidence of acute fracture or subluxation. No prevertebral soft tissue swelling is noted. Moderate degenerative disc disease and spondylosis from C4-T1 noted. Mild to moderate facet arthropathy throughout the cervical spine identified. No soft tissue abnormalities are identified.  IMPRESSION: No static evidence of acute injury to the spine.  Please note that motion artifact slightly limits evaluation.  Multilevel degenerative changes.  Critical Value/emergent results were called by telephone at the time of interpretation on 03/08/2012 at 2:30 p.m.  to Dr. Ranae Palms, who verbally acknowledged these results.   Original Report Authenticated By: Harmon Pier, M.D.    Mr Brain Wo Contrast  03/08/2012  *RADIOLOGY REPORT*  Clinical Data: New mental status changes.  Recent fall.  MRI  HEAD WITHOUT CONTRAST  Technique:  Multiplanar, multiecho pulse sequences of the brain and surrounding structures were obtained according to standard protocol without intravenous contrast.  Comparison: CT head earlier today.  Findings: The patient had difficulty remaining motionless for the study.  Images are suboptimal.  Small or subtle lesions could be overlooked.  The study is marginally diagnostic.  There is too much motion degradation to exclude a small infarct. There is no large cortical infarction observed.  There is advanced atrophy with chronic microvascular ischemic change.  There is no parenchymal hemorrhage.  A 5 mm thick acute left frontotemporal and interhemispheric subdural hematoma is noted.  There is only minimal if any midline shift.  When technique differences are considered, the hematoma appears slightly increased in size from priors.  Flow voids are maintained in the carotid and basilar arteries.  IMPRESSION: There is too much motion degradation to exclude a small cerebral infarction.  No large vessel cortical infarct is observed however.  5 mm thick left frontotemporal and interhemispheric subdural hematoma appears acute and slightly increased in size when compared with earlier CT.  Because of the degree of atrophy and chronic microvascular ischemic change, there is no significant mass effect or midline shift.   Original Report Authenticated By: Davonna Belling, M.D.    Ct Maxillofacial Wo Cm  03/08/2012  *RADIOLOGY REPORT*  Clinical Data:  79 year old female with head, face and neck injury following fall.  Headache, neck pain and facial pain and swelling. Altered mental status and confusion.  CT HEAD WITHOUT CONTRAST CT MAXILLOFACIAL WITHOUT CONTRAST CT CERVICAL SPINE WITHOUT CONTRAST  Technique:  Multidetector CT imaging of the head, cervical spine, and maxillofacial structures were performed using the standard protocol without intravenous contrast. Multiplanar CT image reconstructions of the  cervical spine and maxillofacial structures were also generated.  Comparison:  01/01/2012 and 08/08/2007 CTs  CT HEAD  Findings: A left frontotemporal/anterior left parafalcine subdural hematoma is identified measuring 5 mm in greatest diameter. 2 mm left to right midline shift is noted.  Chronic small vessel white matter ischemic changes are noted with heavy vascular calcifications.  There is no evidence of acute infarction, hydrocephalus or mass lesion. No acute or suspicious bony abnormalities are noted.  IMPRESSION: Left frontotemporal/anterior left parafalcine subdural hematoma measuring 5 mm in greatest diameter with 2 mm left to right midline shift.  Chronic small vessel white matter ischemic changes.  CT MAXILLOFACIAL  Findings:  Left facial soft tissue swelling is identified. There is no evidence of acute fracture, subluxation or dislocation. The paranasal sinuses, mastoid air cells and middle/. The orbits and globes are unremarkable. No focal bony lesions are present.  IMPRESSION: Left facial soft tissue swelling without fracture.  CT CERVICAL SPINE  Findings:   Motion artifact degrades images at the C7-T1 articulation.  There is no evidence of acute fracture or subluxation. No prevertebral soft tissue swelling is noted. Moderate degenerative disc disease and spondylosis from C4-T1 noted. Mild to moderate facet arthropathy throughout the cervical spine identified. No soft tissue abnormalities are identified.  IMPRESSION: No static evidence of acute injury to the spine.  Please note that motion artifact slightly limits evaluation.  Multilevel degenerative changes.  Critical Value/emergent results were called by telephone at the time of interpretation  on 03/08/2012 at 2:30 p.m. to Dr. Ranae Palms, who verbally acknowledged these results.   Original Report Authenticated By: Harmon Pier, M.D.    Assessment: 79 y.o. female with early dementia, recurrent falls, now admitted to the hospital after sustaining a  couple of falls earlier today with resulting altered mental status. Neuro-imaging reveals an approximately 5 mm acute left frontotemporal/anterior left parafalcine subdural  hematoma with  2 mm left to right midline shift. GCS at the time of my assessment is 9-10 ( described as less responsive since 6 pm, but no GCS reported at that moment). I understand that the case was already discussed with neuro-surgery. Patient is 79 years old, has early dementia, the subdural hematoma is small without significant midline shift, and her GCS is 9-10, which make me think the initial approach should be rather conservative at this time. Needs follow up CT brain in am or earlier if clinically indicated.   Will follow up with you.       Wyatt Portela, MD [facility].  03/08/2012 8: 22  pm.

## 2012-03-08 NOTE — ED Notes (Signed)
C-collar removed per Dr. Ranae Palms

## 2012-03-08 NOTE — ED Notes (Signed)
Pt noted to have large hematoma over left eye. Pt has equal strengths. Pt oriented to self only, this is not baseline. Pt does not recognize daughter. Per daughter, pt normally alert and oriented.

## 2012-03-08 NOTE — ED Notes (Signed)
2L o2 placed on pt as sats noted to be 89%

## 2012-03-08 NOTE — ED Notes (Signed)
Md notified that BP was continuing to decrease even after cardene was titrated down. MD ordered to stop cardene drip

## 2012-03-08 NOTE — ED Notes (Signed)
Pt pulled at IV in right forearm, large hematoma noted. Ice applied. Pt denies pain

## 2012-03-08 NOTE — Plan of Care (Signed)
Problem: Phase I Progression Outcomes Goal: Pain controlled with appropriate interventions Outcome: Progressing No obvious distress at this time Goal: OOB as tolerated unless otherwise ordered Outcome: Not Met (add Reason) bedrest Goal: Voiding-avoid urinary catheter unless indicated Outcome: Not Progressing Foley in place fore accurate I?O

## 2012-03-08 NOTE — ED Notes (Signed)
Dr Simonds at bedside. 

## 2012-03-08 NOTE — ED Notes (Signed)
Daughter [phone]

## 2012-03-08 NOTE — H&P (Signed)
History and Physical       Hospital Admission Note Date: 03/08/2012  Patient name: Teresa Richards Medical record number: [id number] Date of birth: 01-13-1919 Age: 79 y.o. Gender: female PCP: GREEN, Lenon Curt, MD    Chief Complaint:  Sent ALF after a fall today  HPI: Patient is a 79 year old female with history of early dementia, diabetes, hypertension, recurrent falls, resident of assisted living facility at [facility] presented to ED after a fall. At the time of my encounter, patient is only oriented to herself and was unable to provide any history. History was obtained from the patient's daughter on the phone, Teresa Richards [phone]) who stated that patient is having falls recently. She was in independent living facility till November when she had a fall and was upgraded to a sister living facility. Today patient had mechanical fall twice in the morning. The facility reported to the daughter that patient was somewhat leaning towards the left and lost her balance and had slurred speech. When the patient's daughter arrived in the ER, patient did not recognize her daughter. The daughter also reported that patient is having episodes of confusion in the last 2 months.  ED workup showed hypokalemia with potassium of 3.1, dehydration (positive ketones in the urine).  CT head showed left frontotemporal/anterior left parafalcine subdural hepatoma 5mm, 2mm left to right midline shift. [name] blood pressure was elevated at 218/93, she was placed on a Cardene drip which was weaned off. PCCM was consulted and recommended step-down admission.   Review of Systems:  Unable to obtain from the patient due to her mental status   Past Medical History: Past Medical History  Diagnosis Date  . Diabetes mellitus without complication   . Hypertension   . Melanoma    No past surgical history on file.  Medications: Prior to Admission medications   Medication  Sig Start Date End Date Taking? Authorizing Provider  aspirin 81 MG chewable tablet Chew 81 mg by mouth daily.   Yes Historical Provider, MD  docusate (COLACE) 50 MG/5ML liquid Take by mouth See admin instructions. INSTILL 3-5 DROPS INTO EAR AT BEDTIME ON SATURDAY AND SUNDAY   Yes Historical Provider, MD  DULoxetine (CYMBALTA) 30 MG capsule Take 30 mg by mouth daily.   Yes Historical Provider, MD  HYDROcodone-acetaminophen (NORCO/VICODIN) 5-325 MG per tablet Take 1 tablet by mouth every 4 (four) hours as needed. Severe pain   Yes Historical Provider, MD  ibuprofen (ADVIL,MOTRIN) 200 MG tablet Take 200 mg by mouth every 6 (six) hours as needed. Pain   Yes Historical Provider, MD  lisinopril (PRINIVIL,ZESTRIL) 40 MG tablet Take 40 mg by mouth daily.   Yes Historical Provider, MD  metoprolol succinate (TOPROL-XL) 25 MG 24 hr tablet Take 25 mg by mouth daily.   Yes Historical Provider, MD  Multiple Vitamins-Minerals (CERTAVITE/ANTIOXIDANTS PO) Take 1 tablet by mouth daily.   Yes Historical Provider, MD    Allergies:   Allergies  Allergen Reactions  . Codeine Other (See Comments)    Per MAR    Social History:  reports that she has never smoked. She has never used smokeless tobacco. She reports that she does not drink alcohol or use illicit drugs. she is currently resident of [facility], per daughter she is functional with her ADLs.  Family History: No family history on file.  Physical Exam: Blood pressure 142/92, pulse 95, temperature 97.8 F (36.6 C), temperature source Oral, resp. rate 31, SpO2 96.00%. General: Confused, oriented to  herself HEENT: normocephalic, atraumatic, anicteric sclera, pink conjunctiva, pupils equal and reactive to light and accomodation, oropharynx clear, laceration on the left facial area lateral to orbit Neck: supple, no masses or lymphadenopathy, no goiter, no bruits  Heart: Regular rate and rhythm, without murmurs, rubs or  gallops. Lungs: Clear to auscultation bilaterally, no wheezing, rales or rhonchi. Abdomen: Soft, nontender, nondistended, positive bowel sounds, no masses. Extremities: No clubbing, cyanosis or edema with positive pedal pulses. Neuro: Not following commands  Psych: alert and oriented x 3, normal mood and affect Skin: no rashes or lesions, warm and dry, laceration on the face as described above, small hematoma on the dorsum of left hand   LABS on Admission:  Basic Metabolic Panel:  Recent Labs Lab 03/08/12 1420  NA 131*  K 3.1*  CL 94*  CO2 25  GLUCOSE 178*  BUN 12  CREATININE 0.50  CALCIUM 10.3   Liver Function Tests:  Recent Labs Lab 03/08/12 1420  AST 21  ALT 14  ALKPHOS 83  BILITOT 0.5  PROT 7.5  ALBUMIN 4.2  CBC:  Recent Labs Lab 03/08/12 1420  WBC 5.6  NEUTROABS 4.7  HGB 14.3  HCT 41.4  MCV 83.3  PLT 179     Radiological Exams on Admission: Ct Head Wo Contrast  03/08/2012  *RADIOLOGY REPORT*  Clinical Data:  79 year old female with head, face and neck injury following fall.  Headache, neck pain and facial pain and swelling. Altered mental status and confusion.  CT HEAD WITHOUT CONTRAST CT MAXILLOFACIAL WITHOUT CONTRAST CT CERVICAL SPINE WITHOUT CONTRAST  Technique:  Multidetector CT imaging of the head, cervical spine, and maxillofacial structures were performed using the standard protocol without intravenous contrast. Multiplanar CT image reconstructions of the cervical spine and maxillofacial structures were also generated.  Comparison:  01/01/2012 and 08/08/2007 CTs  CT HEAD  Findings: A left frontotemporal/anterior left parafalcine subdural hematoma is identified measuring 5 mm in greatest diameter. 2 mm left to right midline shift is noted.  Chronic small vessel white matter ischemic changes are noted with heavy vascular calcifications.  There is no evidence of acute infarction, hydrocephalus or mass lesion. No acute or suspicious bony abnormalities are  noted.  IMPRESSION: Left frontotemporal/anterior left parafalcine subdural hematoma measuring 5 mm in greatest diameter with 2 mm left to right midline shift.  Chronic small vessel white matter ischemic changes.  CT MAXILLOFACIAL  Findings:  Left facial soft tissue swelling is identified. There is no evidence of acute fracture, subluxation or dislocation. The paranasal sinuses, mastoid air cells and middle/. The orbits and globes are unremarkable. No focal bony lesions are present.  IMPRESSION: Left facial soft tissue swelling without fracture.  CT CERVICAL SPINE  Findings:   Motion artifact degrades images at the C7-T1 articulation.  There is no evidence of acute fracture or subluxation. No prevertebral soft tissue swelling is noted. Moderate degenerative disc disease and spondylosis from C4-T1 noted. Mild to moderate facet arthropathy throughout the cervical spine identified. No soft tissue abnormalities are identified.  IMPRESSION: No static evidence of acute injury to the spine.  Please note that motion artifact slightly limits evaluation.  Multilevel degenerative changes.  Critical Value/emergent results were called by telephone at the time of interpretation on 03/08/2012 at 2:30 p.m. to Dr. Ranae Palms, who verbally acknowledged these results.   Original Report Authenticated By: Harmon Pier, M.D.    Ct Cervical Spine Wo Contrast  03/08/2012  *RADIOLOGY REPORT*  Clinical Data:  79 year old female with head, face and neck  injury following fall.  Headache, neck pain and facial pain and swelling. Altered mental status and confusion.  CT HEAD WITHOUT CONTRAST CT MAXILLOFACIAL WITHOUT CONTRAST CT CERVICAL SPINE WITHOUT CONTRAST  Technique:  Multidetector CT imaging of the head, cervical spine, and maxillofacial structures were performed using the standard protocol without intravenous contrast. Multiplanar CT image reconstructions of the cervical spine and maxillofacial structures were also generated.  Comparison:   01/01/2012 and 08/08/2007 CTs  CT HEAD  Findings: A left frontotemporal/anterior left parafalcine subdural hematoma is identified measuring 5 mm in greatest diameter. 2 mm left to right midline shift is noted.  Chronic small vessel white matter ischemic changes are noted with heavy vascular calcifications.  There is no evidence of acute infarction, hydrocephalus or mass lesion. No acute or suspicious bony abnormalities are noted.  IMPRESSION: Left frontotemporal/anterior left parafalcine subdural hematoma measuring 5 mm in greatest diameter with 2 mm left to right midline shift.  Chronic small vessel white matter ischemic changes.  CT MAXILLOFACIAL  Findings:  Left facial soft tissue swelling is identified. There is no evidence of acute fracture, subluxation or dislocation. The paranasal sinuses, mastoid air cells and middle/. The orbits and globes are unremarkable. No focal bony lesions are present.  IMPRESSION: Left facial soft tissue swelling without fracture.  CT CERVICAL SPINE  Findings:   Motion artifact degrades images at the C7-T1 articulation.  There is no evidence of acute fracture or subluxation. No prevertebral soft tissue swelling is noted. Moderate degenerative disc disease and spondylosis from C4-T1 noted. Mild to moderate facet arthropathy throughout the cervical spine identified. No soft tissue abnormalities are identified.  IMPRESSION: No static evidence of acute injury to the spine.  Please note that motion artifact slightly limits evaluation.  Multilevel degenerative changes.  Critical Value/emergent results were called by telephone at the time of interpretation on 03/08/2012 at 2:30 p.m. to Dr. Ranae Palms, who verbally acknowledged these results.   Original Report Authenticated By: Harmon Pier, M.D.    Ct Maxillofacial Wo Cm  03/08/2012  *RADIOLOGY REPORT*  Clinical Data:  79 year old female with head, face and neck injury following fall.  Headache, neck pain and facial pain and swelling.  Altered mental status and confusion.  CT HEAD WITHOUT CONTRAST CT MAXILLOFACIAL WITHOUT CONTRAST CT CERVICAL SPINE WITHOUT CONTRAST  Technique:  Multidetector CT imaging of the head, cervical spine, and maxillofacial structures were performed using the standard protocol without intravenous contrast. Multiplanar CT image reconstructions of the cervical spine and maxillofacial structures were also generated.  Comparison:  01/01/2012 and 08/08/2007 CTs  CT HEAD  Findings: A left frontotemporal/anterior left parafalcine subdural hematoma is identified measuring 5 mm in greatest diameter. 2 mm left to right midline shift is noted.  Chronic small vessel white matter ischemic changes are noted with heavy vascular calcifications.  There is no evidence of acute infarction, hydrocephalus or mass lesion. No acute or suspicious bony abnormalities are noted.  IMPRESSION: Left frontotemporal/anterior left parafalcine subdural hematoma measuring 5 mm in greatest diameter with 2 mm left to right midline shift.  Chronic small vessel white matter ischemic changes.  CT MAXILLOFACIAL  Findings:  Left facial soft tissue swelling is identified. There is no evidence of acute fracture, subluxation or dislocation. The paranasal sinuses, mastoid air cells and middle/. The orbits and globes are unremarkable. No focal bony lesions are present.  IMPRESSION: Left facial soft tissue swelling without fracture.  CT CERVICAL SPINE  Findings:   Motion artifact degrades images at the C7-T1 articulation.  There  is no evidence of acute fracture or subluxation. No prevertebral soft tissue swelling is noted. Moderate degenerative disc disease and spondylosis from C4-T1 noted. Mild to moderate facet arthropathy throughout the cervical spine identified. No soft tissue abnormalities are identified.  IMPRESSION: No static evidence of acute injury to the spine.  Please note that motion artifact slightly limits evaluation.  Multilevel degenerative changes.   Critical Value/emergent results were called by telephone at the time of interpretation on 03/08/2012 at 2:30 p.m. to Dr. Ranae Palms, who verbally acknowledged these results.   Original Report Authenticated By: Harmon Pier, M.D.     Assessment/Plan Principal Problem:   Acute encephalopathy: Likely secondary to Subdural hematoma with possibility of CVA, malignant hypertension - Admit to step down, patient has been weaned off Cardene drip, place on labetalol IV PRN with parameters, cont gentle hydration - Neurosurgery has been consulted, patient to be evaluated by Dr Phoebe Perch - Obtain MRI of the brain without contrast to rule out CVA, she has failed a swallow elevation in ED - Hold aspirin  Active Problems:   Fall at nursing home - Patient is having recurrent falls at the assisted living facility, attempt PT evaluation once alert and oriented, she will likely need to be upgraded to skilled nursing facility    Malignant hypertension - Currently BP stable at 142/92, off Cardene drip. Monitor closely, placed on labetalol PRN    Diabetes mellitus: - Obtain hemoglobin A1c, patient is not on any oral hypoglycemics/insulin outpatient    Laceration- left eye/face- sutured in the ED    Hypokalemia: being replaced in ED  DVT prophylaxis: SCD's  CODE STATUS: I discussed in detail with the patient's daughter, HPOA, Teresa Richards, patient is DNR status   Further plan will depend as patient's clinical course evolves and further radiologic and laboratory data become available.   Time Spent on Admission: 1 hour  Shamal Stracener M.D. [facility] 03/08/2012, 5:50 PM Pager: [phone]  If 7PM-7AM, please contact night-coverage [email or web address] Password [email or web address]

## 2012-03-08 NOTE — ED Notes (Signed)
Pt [facility] has hematoma to left eye with laceration, no blood thinner use, pt with altered mental status, combatitive, marked vital sign changes.

## 2012-03-08 NOTE — ED Notes (Signed)
Pt found to have expressive aphasia, words are inappropriate. Pt not responding appropriately to any questions. Dr. Isidoro Donning informed. Will do stat MRI

## 2012-03-08 NOTE — ED Notes (Addendum)
Neuro MD at bedside

## 2012-03-08 NOTE — ED Notes (Signed)
Beck, PA at bedside to suture

## 2012-03-09 ENCOUNTER — Inpatient Hospital Stay (HOSPITAL_COMMUNITY): Payer: Medicare Other

## 2012-03-09 LAB — BASIC METABOLIC PANEL
Chloride: 96 mEq/L (ref 96–112)
GFR calc Af Amer: 86 mL/min — ABNORMAL LOW (ref >90)
GFR calc non Af Amer: 74 mL/min — ABNORMAL LOW (ref >90)
Potassium: 2.9 mEq/L — ABNORMAL LOW (ref 3.5–5.1)
Sodium: 135 mEq/L (ref 135–145)

## 2012-03-09 LAB — HEMOGLOBIN A1C
Hgb A1c MFr Bld: 6.4 % — ABNORMAL HIGH (ref <5.7)
Mean Plasma Glucose: 137 mg/dL — ABNORMAL HIGH (ref <117)

## 2012-03-09 LAB — GLUCOSE, CAPILLARY: Glucose-Capillary: 225 mg/dL — ABNORMAL HIGH (ref 70–99)

## 2012-03-09 LAB — CBC
Hemoglobin: 14 g/dL (ref 12.0–15.0)
RBC: 4.94 MIL/uL (ref 3.87–5.11)

## 2012-03-09 MED ORDER — INSULIN ASPART 100 UNIT/ML ~~LOC~~ SOLN: 0.0000 [IU] | Freq: Every day | SUBCUTANEOUS | Status: DC

## 2012-03-09 MED ORDER — POTASSIUM CHLORIDE 10 MEQ/100ML IV SOLN
10.0000 meq | INTRAVENOUS | Status: AC
  Administered 2012-03-09 (×6): 10 meq via INTRAVENOUS
  Filled 2012-03-09: qty 300

## 2012-03-09 MED ORDER — METOPROLOL TARTRATE 1 MG/ML IV SOLN
5.0000 mg | Freq: Four times a day (QID) | INTRAVENOUS | Status: DC
  Administered 2012-03-09 – 2012-03-10 (×4): 5 mg via INTRAVENOUS
  Filled 2012-03-09 (×7): qty 5

## 2012-03-09 MED ORDER — INSULIN ASPART 100 UNIT/ML ~~LOC~~ SOLN
0.0000 [IU] | Freq: Three times a day (TID) | SUBCUTANEOUS | Status: DC
  Administered 2012-03-09: 3 [IU] via SUBCUTANEOUS
  Administered 2012-03-10: 2 [IU] via SUBCUTANEOUS
  Administered 2012-03-10: 1 [IU] via SUBCUTANEOUS
  Administered 2012-03-11: 2 [IU] via SUBCUTANEOUS
  Administered 2012-03-12: 1 [IU] via SUBCUTANEOUS
  Administered 2012-03-12: 2 [IU] via SUBCUTANEOUS
  Administered 2012-03-12: 3 [IU] via SUBCUTANEOUS
  Administered 2012-03-13: 1 [IU] via SUBCUTANEOUS
  Administered 2012-03-13: 3 [IU] via SUBCUTANEOUS
  Administered 2012-03-14: 2 [IU] via SUBCUTANEOUS
  Administered 2012-03-14 – 2012-03-15 (×2): 1 [IU] via SUBCUTANEOUS
  Administered 2012-03-15: 2 [IU] via SUBCUTANEOUS
  Administered 2012-03-16 – 2012-03-17 (×4): 1 [IU] via SUBCUTANEOUS

## 2012-03-09 MED ORDER — POTASSIUM CHLORIDE 10 MEQ/100ML IV SOLN
INTRAVENOUS | Status: AC
  Filled 2012-03-09: qty 300

## 2012-03-09 NOTE — Progress Notes (Signed)
Stroke Team Rounding 79 year old female with a past medical history significant for hypertension, diabetes, admitted after sustaining 2 falls at her assisted living and becoming less responsive on 03/08/12. CT head shows small left frontal subdural hematoma. Patient is DNR status.  Subjective: Daughter at bedside. Some fluctuating mental status.   Objective: Vital signs in last 24 hours: Filed Vitals:   03/09/12 0600 03/09/12 0717 03/09/12 0741 03/09/12 0800  BP: 138/56 158/71  165/76  Pulse: 105 106  109  Temp:   99.6 F (37.6 C)   TempSrc:   Oral   Resp: 26 25  25   Height:      Weight:      SpO2: 96% 96%  96%     Intake/Output from previous day: 02/15 0701 - 02/16 0700 In: 600 [I.V.:600] Out: 2950 [Urine:2950]  GENERAL EXAM: Patient is in no distress  CARDIOVASCULAR: Regular rate and rhythm, no murmurs, no carotid bruits  NEUROLOGIC: MENTAL STATUS: EYES CLOSED. RESTLESS. OPENS EYES TO CALLING HER NAME. NO VERBAL. NOT FOLLOWING COMMANDS. POSITIVE SNOUT REFLEX. CRANIAL NERVE: pupils equal and reactive to light, BLINK TO THREAT IN ALL FIELDS. DECR EYE MOVEMENTS IN ALL DIRECTIONS TO COMMAND. FACE SYMM. LEFT ORBITAL ECCHYMOSIS.  MOTOR: normal bulk and tone, MOTOR IMPERSISTANCE. DIFFUSE 2-3/5.  SENSORY: WITHDRAWS TO STIM IN ALL EXT.   Lab Results:  Recent Labs  03/08/12 1420 03/09/12 0548  WBC 5.6 7.5  HGB 14.3 14.0  HCT 41.4 40.3  PLT 179 216    BMET  Recent Labs  03/08/12 1420 03/09/12 0548  NA 131* 135  K 3.1* 2.9*  CL 94* 96  CO2 25 23  GLUCOSE 178* 289*  BUN 12 16  CREATININE 0.50 0.68  CALCIUM 10.3 9.7    LIPIDS: No results found for this basename: chol, trig, hdl, cholhdl, vldl, ldlcalc    HEMOGLOBIN A1C: No results found for this basename: HGBA1C     Studies/Results: Ct Head Wo Contrast  03/09/2012  *RADIOLOGY REPORT*  Clinical Data: Fall with intracranial hemorrhage - follow-up left subdural hematoma.  CT HEAD WITHOUT CONTRAST   Technique:  Contiguous axial images were obtained from the base of the skull through the vertex without contrast.  Comparison: 03/03/2012 CT and MRI.  Findings: Motion artifact decreases sensitivity.  A left frontotemporal and interhemispheric subdural hematoma is unchanged with 5 mm in maximal diameter. Atrophy and chronic small vessel white matter ischemic changes are again noted. No significant midline shift is identified on today's exam.  There is no evidence of new hemorrhage, acute infarction or hydrocephalus.  No acute bony abnormalities are identified.  IMPRESSION: Unchanged left frontotemporal/interhemispheric subdural hematoma with maximal diameter 5 mm.  No new abnormalities or changes.   Original Report Authenticated By: Harmon Pier, M.D.    Ct Head Wo Contrast  03/08/2012  *RADIOLOGY REPORT*  Clinical Data:  79 year old female with head, face and neck injury following fall.  Headache, neck pain and facial pain and swelling. Altered mental status and confusion.  CT HEAD WITHOUT CONTRAST CT MAXILLOFACIAL WITHOUT CONTRAST CT CERVICAL SPINE WITHOUT CONTRAST  Technique:  Multidetector CT imaging of the head, cervical spine, and maxillofacial structures were performed using the standard protocol without intravenous contrast. Multiplanar CT image reconstructions of the cervical spine and maxillofacial structures were also generated.  Comparison:  01/01/2012 and 08/08/2007 CTs  CT HEAD  Findings: A left frontotemporal/anterior left parafalcine subdural hematoma is identified measuring 5 mm in greatest diameter. 2 mm left to right midline shift  is noted.  Chronic small vessel white matter ischemic changes are noted with heavy vascular calcifications.  There is no evidence of acute infarction, hydrocephalus or mass lesion. No acute or suspicious bony abnormalities are noted.  IMPRESSION: Left frontotemporal/anterior left parafalcine subdural hematoma measuring 5 mm in greatest diameter with 2 mm left to right  midline shift.  Chronic small vessel white matter ischemic changes.  CT MAXILLOFACIAL  Findings:  Left facial soft tissue swelling is identified. There is no evidence of acute fracture, subluxation or dislocation. The paranasal sinuses, mastoid air cells and middle/. The orbits and globes are unremarkable. No focal bony lesions are present.  IMPRESSION: Left facial soft tissue swelling without fracture.  CT CERVICAL SPINE  Findings:   Motion artifact degrades images at the C7-T1 articulation.  There is no evidence of acute fracture or subluxation. No prevertebral soft tissue swelling is noted. Moderate degenerative disc disease and spondylosis from C4-T1 noted. Mild to moderate facet arthropathy throughout the cervical spine identified. No soft tissue abnormalities are identified.  IMPRESSION: No static evidence of acute injury to the spine.  Please note that motion artifact slightly limits evaluation.  Multilevel degenerative changes.  Critical Value/emergent results were called by telephone at the time of interpretation on 03/08/2012 at 2:30 p.m. to Dr. Ranae Palms, who verbally acknowledged these results.   Original Report Authenticated By: Harmon Pier, M.D.    Ct Cervical Spine Wo Contrast  03/08/2012  *RADIOLOGY REPORT*  Clinical Data:  79 year old female with head, face and neck injury following fall.  Headache, neck pain and facial pain and swelling. Altered mental status and confusion.  CT HEAD WITHOUT CONTRAST CT MAXILLOFACIAL WITHOUT CONTRAST CT CERVICAL SPINE WITHOUT CONTRAST  Technique:  Multidetector CT imaging of the head, cervical spine, and maxillofacial structures were performed using the standard protocol without intravenous contrast. Multiplanar CT image reconstructions of the cervical spine and maxillofacial structures were also generated.  Comparison:  01/01/2012 and 08/08/2007 CTs  CT HEAD  Findings: A left frontotemporal/anterior left parafalcine subdural hematoma is identified measuring 5 mm  in greatest diameter. 2 mm left to right midline shift is noted.  Chronic small vessel white matter ischemic changes are noted with heavy vascular calcifications.  There is no evidence of acute infarction, hydrocephalus or mass lesion. No acute or suspicious bony abnormalities are noted.  IMPRESSION: Left frontotemporal/anterior left parafalcine subdural hematoma measuring 5 mm in greatest diameter with 2 mm left to right midline shift.  Chronic small vessel white matter ischemic changes.  CT MAXILLOFACIAL  Findings:  Left facial soft tissue swelling is identified. There is no evidence of acute fracture, subluxation or dislocation. The paranasal sinuses, mastoid air cells and middle/. The orbits and globes are unremarkable. No focal bony lesions are present.  IMPRESSION: Left facial soft tissue swelling without fracture.  CT CERVICAL SPINE  Findings:   Motion artifact degrades images at the C7-T1 articulation.  There is no evidence of acute fracture or subluxation. No prevertebral soft tissue swelling is noted. Moderate degenerative disc disease and spondylosis from C4-T1 noted. Mild to moderate facet arthropathy throughout the cervical spine identified. No soft tissue abnormalities are identified.  IMPRESSION: No static evidence of acute injury to the spine.  Please note that motion artifact slightly limits evaluation.  Multilevel degenerative changes.  Critical Value/emergent results were called by telephone at the time of interpretation on 03/08/2012 at 2:30 p.m. to Dr. Ranae Palms, who verbally acknowledged these results.   Original Report Authenticated By: Harmon Pier, M.D.  Mr Brain Wo Contrast  03/08/2012  *RADIOLOGY REPORT*  Clinical Data: New mental status changes.  Recent fall.  MRI HEAD WITHOUT CONTRAST  Technique:  Multiplanar, multiecho pulse sequences of the brain and surrounding structures were obtained according to standard protocol without intravenous contrast.  Comparison: CT head earlier today.   Findings: The patient had difficulty remaining motionless for the study.  Images are suboptimal.  Small or subtle lesions could be overlooked.  The study is marginally diagnostic.  There is too much motion degradation to exclude a small infarct. There is no large cortical infarction observed.  There is advanced atrophy with chronic microvascular ischemic change.  There is no parenchymal hemorrhage.  A 5 mm thick acute left frontotemporal and interhemispheric subdural hematoma is noted.  There is only minimal if any midline shift.  When technique differences are considered, the hematoma appears slightly increased in size from priors.  Flow voids are maintained in the carotid and basilar arteries.  IMPRESSION: There is too much motion degradation to exclude a small cerebral infarction.  No large vessel cortical infarct is observed however.  5 mm thick left frontotemporal and interhemispheric subdural hematoma appears acute and slightly increased in size when compared with earlier CT.  Because of the degree of atrophy and chronic microvascular ischemic change, there is no significant mass effect or midline shift.   Original Report Authenticated By: Davonna Belling, M.D.    Ct Maxillofacial Wo Cm  03/08/2012  *RADIOLOGY REPORT*  Clinical Data:  79 year old female with head, face and neck injury following fall.  Headache, neck pain and facial pain and swelling. Altered mental status and confusion.  CT HEAD WITHOUT CONTRAST CT MAXILLOFACIAL WITHOUT CONTRAST CT CERVICAL SPINE WITHOUT CONTRAST  Technique:  Multidetector CT imaging of the head, cervical spine, and maxillofacial structures were performed using the standard protocol without intravenous contrast. Multiplanar CT image reconstructions of the cervical spine and maxillofacial structures were also generated.  Comparison:  01/01/2012 and 08/08/2007 CTs  CT HEAD  Findings: A left frontotemporal/anterior left parafalcine subdural hematoma is identified measuring 5 mm in  greatest diameter. 2 mm left to right midline shift is noted.  Chronic small vessel white matter ischemic changes are noted with heavy vascular calcifications.  There is no evidence of acute infarction, hydrocephalus or mass lesion. No acute or suspicious bony abnormalities are noted.  IMPRESSION: Left frontotemporal/anterior left parafalcine subdural hematoma measuring 5 mm in greatest diameter with 2 mm left to right midline shift.  Chronic small vessel white matter ischemic changes.  CT MAXILLOFACIAL  Findings:  Left facial soft tissue swelling is identified. There is no evidence of acute fracture, subluxation or dislocation. The paranasal sinuses, mastoid air cells and middle/. The orbits and globes are unremarkable. No focal bony lesions are present.  IMPRESSION: Left facial soft tissue swelling without fracture.  CT CERVICAL SPINE  Findings:   Motion artifact degrades images at the C7-T1 articulation.  There is no evidence of acute fracture or subluxation. No prevertebral soft tissue swelling is noted. Moderate degenerative disc disease and spondylosis from C4-T1 noted. Mild to moderate facet arthropathy throughout the cervical spine identified. No soft tissue abnormalities are identified.  IMPRESSION: No static evidence of acute injury to the spine.  Please note that motion artifact slightly limits evaluation.  Multilevel degenerative changes.  Critical Value/emergent results were called by telephone at the time of interpretation on 03/08/2012 at 2:30 p.m. to Dr. Ranae Palms, who verbally acknowledged these results.   Original Report Authenticated By: Tinnie Gens  Si Gaul, M.D.    Medications:  . potassium chloride  10 mEq Intravenous Q1 Hr x 2   . sodium chloride 75 mL/hr at 03/08/12 2224     Assessment: 79 y.o. female with a past medical history significant for hypertension, diabetes, here with traumatic subdural hematoma (left frontal), stable. Possible underlying dementia. I reviewed imaging myself.  Discussed findings with daughter at bedside and grandson (medical resident at [facility]) via phone.   LOS: 1 day   Plan: - Maintain euvolemia, euglycemia, euthermia - Telemetry - Frequent neuro checks - repeat CT head in 2-3 days - conservative mgmt  [facility] - Stroke Team Joycelyn Schmid, MD 03/09/2012, 11:07 AM   Please refer to [email or web address] for on-call Stroke MD

## 2012-03-09 NOTE — Consult Note (Signed)
Reason for Consult:SDH Referring Physician: Ripudeep Adelee Hannula is an 79 y.o. female.  HPI: Patient is a 79 year old female with history of early dementia, diabetes, hypertension, recurrent falls, resident of assisted living facility at [facility] presented to ED after a fall.   CT showed small left SDH and we were called for consult.  - pt unable to give history   PMH:  Past Medical History  Diagnosis Date  . Diabetes mellitus without complication   . Hypertension   . Melanoma   . Dementia     No past surgical history on file.  Family History: No family history on file.  Social History:  reports that she has never smoked. She has never used smokeless tobacco. She reports that she does not drink alcohol or use illicit drugs.  Allergies:  Allergies  Allergen Reactions  . Codeine Other (See Comments)    Per MAR    Medications:  Prior to Admission:  Prescriptions prior to admission  Medication Sig Dispense Refill  . aspirin 81 MG chewable tablet Chew 81 mg by mouth daily.      Marland Kitchen docusate (COLACE) 50 MG/5ML liquid Take by mouth See admin instructions. INSTILL 3-5 DROPS INTO EAR AT BEDTIME ON SATURDAY AND SUNDAY      . DULoxetine (CYMBALTA) 30 MG capsule Take 30 mg by mouth daily.      Marland Kitchen HYDROcodone-acetaminophen (NORCO/VICODIN) 5-325 MG per tablet Take 1 tablet by mouth every 4 (four) hours as needed. Severe pain      . ibuprofen (ADVIL,MOTRIN) 200 MG tablet Take 200 mg by mouth every 6 (six) hours as needed. Pain      . lisinopril (PRINIVIL,ZESTRIL) 40 MG tablet Take 40 mg by mouth daily.      . metoprolol succinate (TOPROL-XL) 25 MG 24 hr tablet Take 25 mg by mouth daily.      . Multiple Vitamins-Minerals (CERTAVITE/ANTIOXIDANTS PO) Take 1 tablet by mouth daily.        Results for orders placed during the hospital encounter of 03/08/12 (from the past 48 hour(s))  CBC WITH DIFFERENTIAL     Status: Abnormal   Collection Time    03/08/12  2:20 PM      Result  Value Range   WBC 5.6  4.0 - 10.5 K/uL   RBC 4.97  3.87 - 5.11 MIL/uL   Hemoglobin 14.3  12.0 - 15.0 g/dL   HCT 21.3  08.6 - 57.8 %   MCV 83.3  78.0 - 100.0 fL   MCH 28.8  26.0 - 34.0 pg   MCHC 34.5  30.0 - 36.0 g/dL   RDW 46.9  62.9 - 52.8 %   Platelets 179  150 - 400 K/uL   Neutrophils Relative 84 (*) 43 - 77 %   Neutro Abs 4.7  1.7 - 7.7 K/uL   Lymphocytes Relative 10 (*) 12 - 46 %   Lymphs Abs 0.6 (*) 0.7 - 4.0 K/uL   Monocytes Relative 5  3 - 12 %   Monocytes Absolute 0.3  0.1 - 1.0 K/uL   Eosinophils Relative 0  0 - 5 %   Eosinophils Absolute 0.0  0.0 - 0.7 K/uL   Basophils Relative 0  0 - 1 %   Basophils Absolute 0.0  0.0 - 0.1 K/uL  COMPREHENSIVE METABOLIC PANEL     Status: Abnormal   Collection Time    03/08/12  2:20 PM      Result Value Range  Sodium 131 (*) 135 - 145 mEq/L   Potassium 3.1 (*) 3.5 - 5.1 mEq/L   Chloride 94 (*) 96 - 112 mEq/L   CO2 25  19 - 32 mEq/L   Glucose, Bld 178 (*) 70 - 99 mg/dL   BUN 12  6 - 23 mg/dL   Creatinine, Ser 1.61  0.50 - 1.10 mg/dL   Calcium 09.6  8.4 - 04.5 mg/dL   Total Protein 7.5  6.0 - 8.3 g/dL   Albumin 4.2  3.5 - 5.2 g/dL   AST 21  0 - 37 U/L   ALT 14  0 - 35 U/L   Alkaline Phosphatase 83  39 - 117 U/L   Total Bilirubin 0.5  0.3 - 1.2 mg/dL   GFR calc non Af Amer 82 (*) >90 mL/min   GFR calc Af Amer >90  >90 mL/min   Comment:            The eGFR has been calculated     using the CKD EPI equation.     This calculation has not been     validated in all clinical     situations.     eGFR's persistently     <90 mL/min signify     possible Chronic Kidney Disease.  PROTIME-INR     Status: None   Collection Time    03/08/12  2:20 PM      Result Value Range   Prothrombin Time 12.3  11.6 - 15.2 seconds   INR 0.92  0.00 - 1.49  APTT     Status: Abnormal   Collection Time    03/08/12  2:20 PM      Result Value Range   aPTT 23 (*) 24 - 37 seconds  URINALYSIS, ROUTINE W REFLEX MICROSCOPIC     Status: Abnormal    Collection Time    03/08/12  3:31 PM      Result Value Range   Color, Urine YELLOW  YELLOW   APPearance CLEAR  CLEAR   Specific Gravity, Urine 1.011  1.005 - 1.030   pH 8.0  5.0 - 8.0   Glucose, UA 100 (*) NEGATIVE mg/dL   Hgb urine dipstick SMALL (*) NEGATIVE   Bilirubin Urine NEGATIVE  NEGATIVE   Ketones, ur 15 (*) NEGATIVE mg/dL   Protein, ur 409 (*) NEGATIVE mg/dL   Urobilinogen, UA 0.2  0.0 - 1.0 mg/dL   Nitrite NEGATIVE  NEGATIVE   Leukocytes, UA NEGATIVE  NEGATIVE  URINE MICROSCOPIC-ADD ON     Status: None   Collection Time    03/08/12  3:31 PM      Result Value Range   Squamous Epithelial / LPF RARE  RARE   RBC / HPF 0-2  <3 RBC/hpf   Urine-Other AMORPHOUS URATES/PHOSPHATES    MRSA PCR SCREENING     Status: None   Collection Time    03/08/12 10:32 PM      Result Value Range   MRSA by PCR NEGATIVE  NEGATIVE   Comment:            The GeneXpert MRSA Assay (FDA     approved for NASAL specimens     only), is one component of a     comprehensive MRSA colonization     surveillance program. It is not     intended to diagnose MRSA     infection nor to guide or     monitor treatment for     MRSA infections.  BASIC METABOLIC PANEL     Status: Abnormal   Collection Time    03/09/12  5:48 AM      Result Value Range   Sodium 135  135 - 145 mEq/L   Potassium 2.9 (*) 3.5 - 5.1 mEq/L   Chloride 96  96 - 112 mEq/L   CO2 23  19 - 32 mEq/L   Glucose, Bld 289 (*) 70 - 99 mg/dL   BUN 16  6 - 23 mg/dL   Creatinine, Ser 9.56  0.50 - 1.10 mg/dL   Calcium 9.7  8.4 - 21.3 mg/dL   GFR calc non Af Amer 74 (*) >90 mL/min   GFR calc Af Amer 86 (*) >90 mL/min   Comment:            The eGFR has been calculated     using the CKD EPI equation.     This calculation has not been     validated in all clinical     situations.     eGFR's persistently     <90 mL/min signify     possible Chronic Kidney Disease.  CBC     Status: None   Collection Time    03/09/12  5:48 AM      Result  Value Range   WBC 7.5  4.0 - 10.5 K/uL   RBC 4.94  3.87 - 5.11 MIL/uL   Hemoglobin 14.0  12.0 - 15.0 g/dL   HCT 08.6  57.8 - 46.9 %   MCV 81.6  78.0 - 100.0 fL   MCH 28.3  26.0 - 34.0 pg   MCHC 34.7  30.0 - 36.0 g/dL   RDW 62.9  52.8 - 41.3 %   Platelets 216  150 - 400 K/uL    Ct Head Wo Contrast  03/09/2012  *RADIOLOGY REPORT*  Clinical Data: Fall with intracranial hemorrhage - follow-up left subdural hematoma.  CT HEAD WITHOUT CONTRAST  Technique:  Contiguous axial images were obtained from the base of the skull through the vertex without contrast.  Comparison: 03/03/2012 CT and MRI.  Findings: Motion artifact decreases sensitivity.  A left frontotemporal and interhemispheric subdural hematoma is unchanged with 5 mm in maximal diameter. Atrophy and chronic small vessel white matter ischemic changes are again noted. No significant midline shift is identified on today's exam.  There is no evidence of new hemorrhage, acute infarction or hydrocephalus.  No acute bony abnormalities are identified.  IMPRESSION: Unchanged left frontotemporal/interhemispheric subdural hematoma with maximal diameter 5 mm.  No new abnormalities or changes.   Original Report Authenticated By: Harmon Pier, M.D.    Ct Head Wo Contrast  03/08/2012  *RADIOLOGY REPORT*  Clinical Data:  79 year old female with head, face and neck injury following fall.  Headache, neck pain and facial pain and swelling. Altered mental status and confusion.  CT HEAD WITHOUT CONTRAST CT MAXILLOFACIAL WITHOUT CONTRAST CT CERVICAL SPINE WITHOUT CONTRAST  Technique:  Multidetector CT imaging of the head, cervical spine, and maxillofacial structures were performed using the standard protocol without intravenous contrast. Multiplanar CT image reconstructions of the cervical spine and maxillofacial structures were also generated.  Comparison:  01/01/2012 and 08/08/2007 CTs  CT HEAD  Findings: A left frontotemporal/anterior left parafalcine subdural hematoma  is identified measuring 5 mm in greatest diameter. 2 mm left to right midline shift is noted.  Chronic small vessel white matter ischemic changes are noted with heavy vascular calcifications.  There is no evidence of acute infarction, hydrocephalus or mass lesion.  No acute or suspicious bony abnormalities are noted.  IMPRESSION: Left frontotemporal/anterior left parafalcine subdural hematoma measuring 5 mm in greatest diameter with 2 mm left to right midline shift.  Chronic small vessel white matter ischemic changes.  CT MAXILLOFACIAL  Findings:  Left facial soft tissue swelling is identified. There is no evidence of acute fracture, subluxation or dislocation. The paranasal sinuses, mastoid air cells and middle/. The orbits and globes are unremarkable. No focal bony lesions are present.  IMPRESSION: Left facial soft tissue swelling without fracture.  CT CERVICAL SPINE  Findings:   Motion artifact degrades images at the C7-T1 articulation.  There is no evidence of acute fracture or subluxation. No prevertebral soft tissue swelling is noted. Moderate degenerative disc disease and spondylosis from C4-T1 noted. Mild to moderate facet arthropathy throughout the cervical spine identified. No soft tissue abnormalities are identified.  IMPRESSION: No static evidence of acute injury to the spine.  Please note that motion artifact slightly limits evaluation.  Multilevel degenerative changes.  Critical Value/emergent results were called by telephone at the time of interpretation on 03/08/2012 at 2:30 p.m. to Dr. Ranae Palms, who verbally acknowledged these results.   Original Report Authenticated By: Harmon Pier, M.D.    Ct Cervical Spine Wo Contrast  03/08/2012  *RADIOLOGY REPORT*  Clinical Data:  79 year old female with head, face and neck injury following fall.  Headache, neck pain and facial pain and swelling. Altered mental status and confusion.  CT HEAD WITHOUT CONTRAST CT MAXILLOFACIAL WITHOUT CONTRAST CT CERVICAL SPINE  WITHOUT CONTRAST  Technique:  Multidetector CT imaging of the head, cervical spine, and maxillofacial structures were performed using the standard protocol without intravenous contrast. Multiplanar CT image reconstructions of the cervical spine and maxillofacial structures were also generated.  Comparison:  01/01/2012 and 08/08/2007 CTs  CT HEAD  Findings: A left frontotemporal/anterior left parafalcine subdural hematoma is identified measuring 5 mm in greatest diameter. 2 mm left to right midline shift is noted.  Chronic small vessel white matter ischemic changes are noted with heavy vascular calcifications.  There is no evidence of acute infarction, hydrocephalus or mass lesion. No acute or suspicious bony abnormalities are noted.  IMPRESSION: Left frontotemporal/anterior left parafalcine subdural hematoma measuring 5 mm in greatest diameter with 2 mm left to right midline shift.  Chronic small vessel white matter ischemic changes.  CT MAXILLOFACIAL  Findings:  Left facial soft tissue swelling is identified. There is no evidence of acute fracture, subluxation or dislocation. The paranasal sinuses, mastoid air cells and middle/. The orbits and globes are unremarkable. No focal bony lesions are present.  IMPRESSION: Left facial soft tissue swelling without fracture.  CT CERVICAL SPINE  Findings:   Motion artifact degrades images at the C7-T1 articulation.  There is no evidence of acute fracture or subluxation. No prevertebral soft tissue swelling is noted. Moderate degenerative disc disease and spondylosis from C4-T1 noted. Mild to moderate facet arthropathy throughout the cervical spine identified. No soft tissue abnormalities are identified.  IMPRESSION: No static evidence of acute injury to the spine.  Please note that motion artifact slightly limits evaluation.  Multilevel degenerative changes.  Critical Value/emergent results were called by telephone at the time of interpretation on 03/08/2012 at 2:30 p.m. to  Dr. Ranae Palms, who verbally acknowledged these results.   Original Report Authenticated By: Harmon Pier, M.D.    Mr Brain Wo Contrast  03/08/2012  *RADIOLOGY REPORT*  Clinical Data: New mental status changes.  Recent fall.  MRI HEAD WITHOUT CONTRAST  Technique:  Multiplanar, multiecho pulse sequences of the brain and surrounding structures were obtained according to standard protocol without intravenous contrast.  Comparison: CT head earlier today.  Findings: The patient had difficulty remaining motionless for the study.  Images are suboptimal.  Small or subtle lesions could be overlooked.  The study is marginally diagnostic.  There is too much motion degradation to exclude a small infarct. There is no large cortical infarction observed.  There is advanced atrophy with chronic microvascular ischemic change.  There is no parenchymal hemorrhage.  A 5 mm thick acute left frontotemporal and interhemispheric subdural hematoma is noted.  There is only minimal if any midline shift.  When technique differences are considered, the hematoma appears slightly increased in size from priors.  Flow voids are maintained in the carotid and basilar arteries.  IMPRESSION: There is too much motion degradation to exclude a small cerebral infarction.  No large vessel cortical infarct is observed however.  5 mm thick left frontotemporal and interhemispheric subdural hematoma appears acute and slightly increased in size when compared with earlier CT.  Because of the degree of atrophy and chronic microvascular ischemic change, there is no significant mass effect or midline shift.   Original Report Authenticated By: Davonna Belling, M.D.    Ct Maxillofacial Wo Cm  03/08/2012  *RADIOLOGY REPORT*  Clinical Data:  79 year old female with head, face and neck injury following fall.  Headache, neck pain and facial pain and swelling. Altered mental status and confusion.  CT HEAD WITHOUT CONTRAST CT MAXILLOFACIAL WITHOUT CONTRAST CT CERVICAL SPINE  WITHOUT CONTRAST  Technique:  Multidetector CT imaging of the head, cervical spine, and maxillofacial structures were performed using the standard protocol without intravenous contrast. Multiplanar CT image reconstructions of the cervical spine and maxillofacial structures were also generated.  Comparison:  01/01/2012 and 08/08/2007 CTs  CT HEAD  Findings: A left frontotemporal/anterior left parafalcine subdural hematoma is identified measuring 5 mm in greatest diameter. 2 mm left to right midline shift is noted.  Chronic small vessel white matter ischemic changes are noted with heavy vascular calcifications.  There is no evidence of acute infarction, hydrocephalus or mass lesion. No acute or suspicious bony abnormalities are noted.  IMPRESSION: Left frontotemporal/anterior left parafalcine subdural hematoma measuring 5 mm in greatest diameter with 2 mm left to right midline shift.  Chronic small vessel white matter ischemic changes.  CT MAXILLOFACIAL  Findings:  Left facial soft tissue swelling is identified. There is no evidence of acute fracture, subluxation or dislocation. The paranasal sinuses, mastoid air cells and middle/. The orbits and globes are unremarkable. No focal bony lesions are present.  IMPRESSION: Left facial soft tissue swelling without fracture.  CT CERVICAL SPINE  Findings:   Motion artifact degrades images at the C7-T1 articulation.  There is no evidence of acute fracture or subluxation. No prevertebral soft tissue swelling is noted. Moderate degenerative disc disease and spondylosis from C4-T1 noted. Mild to moderate facet arthropathy throughout the cervical spine identified. No soft tissue abnormalities are identified.  IMPRESSION: No static evidence of acute injury to the spine.  Please note that motion artifact slightly limits evaluation.  Multilevel degenerative changes.  Critical Value/emergent results were called by telephone at the time of interpretation on 03/08/2012 at 2:30 p.m. to  Dr. Ranae Palms, who verbally acknowledged these results.   Original Report Authenticated By: Harmon Pier, M.D.     ROS - unintelligible speech  Blood pressure 165/76, pulse 109, temperature 99.6 F (37.6 C), temperature source Oral, resp. rate 25,  height 5' (1.524 m), weight 59.7 kg (131 lb 9.8 oz), SpO2 96.00%. AA, uninteligible speech -   Moves all 4 spontaneusly - does not FC  ,  Pupils reactive  Assessment/Plan: Pt with very small Left SDH with no significant shift -   No surgical intervention needed at this point - would repeat CT in 2-3 days  - will follow  Mililani Murthy R, MD 03/09/2012, 9:26 AM

## 2012-03-09 NOTE — Progress Notes (Signed)
Pt becoming difficult to assess. Difficulty with recognizing changes to changing baseline. Suggest palliative meeting with family. Premature or appropriate? BP more stable this am, will continue to monitor.

## 2012-03-09 NOTE — Progress Notes (Signed)
[facility] Progress Note [place] TEAM 1 - Stepdown/ICU TEAM   Teresa Richards AOZ:[id number] DOB: [birth year] DOA: 03/08/2012 PCP: Kimber Relic, MD  Brief narrative: Patient is a 79 year old female with history of early dementia, diabetes, hypertension, recurrent falls, resident of assisted living facility at [facility] presented to ED after a fall. At the time of my encounter, patient is only oriented to herself and was unable to provide any history. History was obtained from the patient's daughter on the phone, [name] [phone]) who stated that patient is having falls recently. She was in independent living facility till November when she had a fall and was upgraded to a sister living facility. Today patient had mechanical fall twice in the morning. The facility reported to the daughter that patient was somewhat leaning towards the left and lost her balance and had slurred speech. When the patient's daughter arrived in the ER, patient did not recognize her daughter. The daughter also reported that patient is having episodes of confusion in the last 2 months.  ED workup showed hypokalemia with potassium of 3.1, dehydration (positive ketones in the urine). CT head showed left frontotemporal/anterior left parafalcine subdural hepatoma 5mm, 2mm left to right midline shift. [name] blood pressure was elevated at 218/93, she was placed on a Cardene drip which was weaned off. PCCM was consulted and recommended step-down admission.  Assessment/Plan: Principal Problem:   Subdural hematoma Being followed by neurology, however not a surgical contact  Active Problems:   Fall at nursing home PT eval once she is able to follow commands    Malignant hypertension BP has improved-we'll add routine IV Lopressor CT does not show any CVA therefore we should be able to lower blood pressure   Encephalopathy Has failed swallow eval-nonverbal but not agitated    Diabetes mellitus? Does not appear  to be on medication as outpatient per med rec Placed on a sensitive sliding scale for now  Hypokalemia Replacing via IV   Laceration- left eye/face Monitor for infection    Dementia Stable without agitation    Code Status: DO NOT RESUSCITATE Family Communication: None Disposition Plan: Follow in step  Consultants: Neurosurgeon Neurology Procedures: None  Antibiotics: None  DVT prophylaxis: SCDs  HPI/Subjective: Patient is lethargic and nonverbal   Objective: Blood pressure 162/68, pulse 104, temperature 99.9 F (37.7 C), temperature source Oral, resp. rate 21, height 5' (1.524 m), weight 59.7 kg (131 lb 9.8 oz), SpO2 97.00%.  Intake/Output Summary (Last 24 hours) at 03/09/12 1520 Last data filed at 03/09/12 1124  Gross per 24 hour  Intake    900 ml  Output   3225 ml  Net  -2325 ml     Exam: General: Lethargic, nonverbal, not following commands -moving all 4 extremities on her own No acute respiratory distress Lungs: Clear to auscultation bilaterally without wheezes or crackles Cardiovascular: Regular rate and rhythm without murmur gallop or rub normal S1 and S2 Abdomen: Nontender, nondistended, soft, bowel sounds positive, no rebound, no ascites, no appreciable mass Extremities: No significant cyanosis, clubbing, or edema bilateral lower extremities  Data Reviewed: Basic Metabolic Panel:  Recent Labs Lab 03/08/12 1420 03/09/12 0548  NA 131* 135  K 3.1* 2.9*  CL 94* 96  CO2 25 23  GLUCOSE 178* 289*  BUN 12 16  CREATININE 0.50 0.68  CALCIUM 10.3 9.7   Liver Function Tests:  Recent Labs Lab 03/08/12 1420  AST 21  ALT 14  ALKPHOS 83  BILITOT 0.5  PROT 7.5  ALBUMIN 4.2   No results found for this basename: LIPASE, AMYLASE,  in the last 168 hours No results found for this basename: AMMONIA,  in the last 168 hours CBC:  Recent Labs Lab 03/08/12 1420 03/09/12 0548  WBC 5.6 7.5  NEUTROABS 4.7  --   HGB 14.3 14.0  HCT 41.4 40.3  MCV  83.3 81.6  PLT 179 216   Cardiac Enzymes: No results found for this basename: CKTOTAL, CKMB, CKMBINDEX, TROPONINI,  in the last 168 hours BNP (last 3 results) No results found for this basename: PROBNP,  in the last 8760 hours CBG: No results found for this basename: GLUCAP,  in the last 168 hours  Recent Results (from the past 240 hour(s))  MRSA PCR SCREENING     Status: None   Collection Time    03/08/12 10:32 PM      Result Value Range Status   MRSA by PCR NEGATIVE  NEGATIVE Final   Comment:            The GeneXpert MRSA Assay (FDA     approved for NASAL specimens     only), is one component of a     comprehensive MRSA colonization     surveillance program. It is not     intended to diagnose MRSA     infection nor to guide or     monitor treatment for     MRSA infections.     Studies:  Recent x-ray studies have been reviewed in detail by the Attending Physician  Scheduled Meds:  Reviewed in detail by the Attending Physician   [facility]  If 7PM-7AM, please contact night-coverage [email or web address] Password [email or web address] 03/09/2012, 3:20 PM   LOS: 1 day

## 2012-03-09 NOTE — ED Provider Notes (Signed)
Medical screening examination/treatment/procedure(s) were performed by non-physician practitioner and as supervising physician I was immediately available for consultation/collaboration.   Dione Booze, MD 03/09/12 [phone]

## 2012-03-09 NOTE — Evaluation (Signed)
Clinical/Bedside Swallow Evaluation Patient Details  Name: Teresa Richards MRN: [id number] Date of Birth: [birth year]  Today's Date: 03/09/2012 Time: 1030-1100 SLP Time Calculation (min): 30 min  Past Medical History:  Past Medical History  Diagnosis Date  . Diabetes mellitus without complication   . Hypertension   . Melanoma   . Dementia    Past Surgical History: No past surgical history on file. HPI:  Patient is a 79 year old female with history of early dementia, diabetes, hypertension, recurrent falls, resident of assisted living facility at [facility] presented to ED after a fall. At the time of my encounter, patient is only oriented to herself and was unable to provide any history. History was obtained from the patient's daughter on the phone, [name] [phone]) who stated that patient is having falls recently. She was in independent living facility till November when she had a fall and was upgraded to a sister living facility. Today patient had mechanical fall twice in the morning. The facility reported to the daughter that patient was somewhat leaning towards the left and lost her balance and had slurred speech. When the patient's daughter arrived in the ER, patient did not recognize her daughter. The daughter also reported that patient is having episodes of confusion in the last 2 months. MRI:  5 mm thick left frontotemporal and interhemispheric subduralhematoma appears acute and slightly increased in size when compared with earlier CT.  Because of the degree of atrophy and chronic microvascular ischemic change there is not significant mass effect or midline shift. Patient referred for BSE per stroke protocol    Assessment / Plan / Recommendation Clinical Impression  Moderate dysphagia indicated.  Patient with eyes closed but would awaken for brief moments to administer PO's.  Oral dysphagia characterized by poor oral awareness with inability to close lips around spoon.  Mastication of  ice chips delayed but functional.  S/s of aspiration noted s/p swallow of all consistencies due to delay in initiation with reduced hyoid laryngeal elevation.  Cough response weak and ineffective.  Patient required moderate to max verbal and tactile cues to maintain sustained attention to complete evaluation.  Recommend continued NPO status including medication due to patient presenting with inability to protect airway with PO's.  ST to reassess swallow bedside on 03/10/12 for possible PO readiness.      Aspiration Risk  Severe    Diet Recommendation NPO   Medication Administration: Via alternative means    Other  Recommendations     Follow Up Recommendations  Skilled Nursing facility    Frequency and Duration min 2x/week  2 weeks   Pertinent Vitals/Pain RR increased to upper 20's s/p PO trials    SLP Swallow Goals Goal #3: Patient will maintain LOA and sustained attention for 15 minutes to safely consume diagnostic PO trials of various consistencies to assess for PO readiness   Swallow Study Prior Functional Status   Resident of AL with no prior history of dysphagia per daughter   General Date of Onset: 03/08/12 HPI: Patient is a 79 year old female with history of early dementia, diabetes, hypertension, recurrent falls, resident of assisted living facility at [facility] presented to ED after a fall. At the time of my encounter, patient is only oriented to herself and was unable to provide any history. History was obtained from the patient's daughter on the phone, [name] [phone]) who stated that patient is having falls recently. She was in independent living facility till November when she had a fall and was upgraded  to a sister living facility. Today patient had mechanical fall twice in the morning. The facility reported to the daughter that patient was somewhat leaning towards the left and lost her balance and had slurred speech. When the patient's daughter arrived in the ER,  patient did not recognize her daughter. The daughter also reported that patient is having episodes of confusion in the last 2 months.   Type of Study: Bedside swallow evaluation Diet Prior to this Study: NPO Temperature Spikes Noted: No Respiratory Status: Room air History of Recent Intubation: No Behavior/Cognition: Cooperative;Confused;Lethargic;Distractible;Other (comment) (aphasic) Oral Cavity - Dentition: Adequate natural dentition Self-Feeding Abilities: Total assist Patient Positioning: Upright in bed Baseline Vocal Quality: Clear Volitional Cough: Cognitively unable to elicit Volitional Swallow: Unable to elicit    Oral/Motor/Sensory Function Overall Oral Motor/Sensory Function: Impaired Labial ROM: Reduced right Labial Symmetry: Abnormal symmetry right Labial Strength: Reduced Labial Sensation: Reduced Lingual ROM: Reduced right Lingual Symmetry: Abnormal symmetry right Lingual Strength: Reduced Lingual Sensation: Reduced Facial ROM: Reduced right Facial Symmetry: Within Functional Limits Facial Strength: Reduced Facial Sensation: Reduced Velum: Within Functional Limits Mandible: Within Functional Limits   Ice Chips Ice chips: Impaired Pharyngeal Phase Impairments: Suspected delayed Swallow;Decreased hyoid-laryngeal movement;Throat Clearing - Delayed   Thin Liquid Thin Liquid: Impaired Presentation: Spoon Pharyngeal  Phase Impairments: Suspected delayed Swallow;Decreased hyoid-laryngeal movement;Throat Clearing - Delayed;Cough - Delayed;Change in Vital Signs    Nectar Thick Nectar Thick Liquid: Impaired Presentation: Spoon Pharyngeal Phase Impairments: Suspected delayed Swallow;Decreased hyoid-laryngeal movement;Cough - Delayed;Change in Vital Signs   Honey Thick Honey Thick Liquid: Not tested   Puree Puree: Impaired Presentation: Spoon Pharyngeal Phase Impairments: Suspected delayed Swallow;Decreased hyoid-laryngeal movement;Cough - Delayed;Change in Vital Signs    Solid   GO   Moreen Fowler MS, CCC-SLP [phone] Solid: Not tested Other Comments: deferred due to decreased sustained attention        Annlee Glandon 03/09/2012,11:31 AM

## 2012-03-10 LAB — GLUCOSE, CAPILLARY
Glucose-Capillary: 142 mg/dL — ABNORMAL HIGH (ref 70–99)
Glucose-Capillary: 183 mg/dL — ABNORMAL HIGH (ref 70–99)
Glucose-Capillary: 115 mg/dL — ABNORMAL HIGH (ref 70–99)

## 2012-03-10 LAB — CBC
MCH: 28 pg (ref 26.0–34.0)
MCHC: 33.2 g/dL (ref 30.0–36.0)
Platelets: 196 10*3/uL (ref 150–400)
RDW: 15.9 % — ABNORMAL HIGH (ref 11.5–15.5)

## 2012-03-10 LAB — BASIC METABOLIC PANEL
BUN: 37 mg/dL — ABNORMAL HIGH (ref 6–23)
Calcium: 9.5 mg/dL (ref 8.4–10.5)
Creatinine, Ser: 0.98 mg/dL (ref 0.50–1.10)
GFR calc Af Amer: 57 mL/min — ABNORMAL LOW (ref >90)
GFR calc non Af Amer: 49 mL/min — ABNORMAL LOW (ref >90)
Glucose, Bld: 146 mg/dL — ABNORMAL HIGH (ref 70–99)

## 2012-03-10 LAB — URINE CULTURE
Colony Count: NO GROWTH
Culture: NO GROWTH

## 2012-03-10 MED ORDER — METOPROLOL SUCCINATE 12.5 MG HALF TABLET
12.5000 mg | ORAL_TABLET | Freq: Every day | ORAL | Status: DC
  Administered 2012-03-10 – 2012-03-17 (×8): 12.5 mg via ORAL
  Filled 2012-03-10 (×8): qty 1

## 2012-03-10 MED ORDER — HYDROMORPHONE HCL PF 1 MG/ML IJ SOLN: 0.5000 mg | INTRAMUSCULAR | Status: DC | PRN

## 2012-03-10 MED ORDER — LISINOPRIL 40 MG PO TABS: 40.0000 mg | ORAL_TABLET | Freq: Every day | ORAL | Status: DC

## 2012-03-10 MED ORDER — LISINOPRIL 10 MG PO TABS
10.0000 mg | ORAL_TABLET | Freq: Every day | ORAL | Status: DC
  Administered 2012-03-10 – 2012-03-12 (×3): 10 mg via ORAL
  Filled 2012-03-10 (×3): qty 1

## 2012-03-10 MED ORDER — WHITE PETROLATUM GEL
Status: AC
  Administered 2012-03-10: 0.2
  Filled 2012-03-10: qty 5

## 2012-03-10 MED ORDER — METOPROLOL SUCCINATE ER 25 MG PO TB24: 25.0000 mg | ORAL_TABLET | Freq: Every day | ORAL | Status: DC

## 2012-03-10 MED ORDER — SODIUM CHLORIDE 0.9 % IV BOLUS (SEPSIS)
500.0000 mL | Freq: Once | INTRAVENOUS | Status: AC
  Administered 2012-03-10: 500 mL via INTRAVENOUS

## 2012-03-10 NOTE — Clinical Social Work Psychosocial (Signed)
     Clinical Social Work Department BRIEF PSYCHOSOCIAL ASSESSMENT 03/10/2012  Patient:  Teresa Richards, Teresa Richards     Account Number:  [id number]     Admit date:  03/08/2012  Clinical Social Worker:  Peggyann Shoals  Date/Time:  03/10/2012 05:42 PM  Referred by:  Physician  Date Referred:  03/10/2012 Referred for  Other - See comment   Other Referral:   Pt was admitted from a facility   Interview type:  Family Other interview type:    PSYCHOSOCIAL DATA Living Status:  FACILITY Admitted from facility:  [facility] Level of care:  Assisted Living Primary support name:  Tobey Bride Primary support relationship to patient:  CHILD, ADULT Degree of support available:   supportive    CURRENT CONCERNS Current Concerns  Post-Acute Placement   Other Concerns:    SOCIAL WORK ASSESSMENT / PLAN CSW spoke to pt's daughter to address consult. CSW introduced herself and explained role of social work. CSW also explained the process of discharging to an ALF with HHPT and SNF with [facility] as a primary insurance. PT/OT evals were pending at time of assessment. Awaiting discharge recommendations.    CSW spoke to [facility] admissions coordinator, who will do their best to make room at SNF, if pt requires that level of care. At time of note, SNF bed was not available.    Pt is a resident of the ALF at [facility]. Pt's husband is at the SNF at [facility]. Pt's daughter prefers that pt go to SNF at [facility], if it is needed.    CSW will initiate placement referral at [facility]. CSW will follow up with bed offers. CSW will continue to follow.   Assessment/plan status:  Psychosocial Support/Ongoing Assessment of Needs Other assessment/ plan:   Information/referral to community resources:   Pt is a long term resisdent of [facility]    PATIENTS/FAMILYS RESPONSE TO PLAN OF CARE: Pt was sleeping soundly. Pt's daughter was very pleasant and  agreeable to discharge plan.

## 2012-03-10 NOTE — Evaluation (Signed)
Physical Therapy Evaluation Patient Details Name: Teresa Richards MRN: [id number] DOB: 25-May-1918 Today's Date: 03/10/2012 Time: 4098-1191 PT Time Calculation (min): 19 min  PT Assessment / Plan / Recommendation Clinical Impression  Pt 79 yo female who sustained a fall at ALF now with SDH. Pt with memory deficits, balance impairements, and generalized weakness. Spoke with daughter and recommend respite SNF through [facility] with end goal to return to assisted living. Pt currenlty unable to return to ALF at this time due to increased falls risk and cognitive deficts.    PT Assessment  Patient needs continued PT services    Follow Up Recommendations  SNF;Supervision/Assistance - 24 hour    Does the patient have the potential to tolerate intense rehabilitation      Barriers to Discharge None      Equipment Recommendations  None recommended by PT    Recommendations for Other Services     Frequency Min 3X/week    Precautions / Restrictions Precautions Precautions: Fall Precaution Comments: pt also with confusion Restrictions Weight Bearing Restrictions: No   Pertinent Vitals/Pain Pt denies pain, however L eye with bruising and laceration with stitches      Mobility  Bed Mobility Bed Mobility: Supine to Sit Supine to Sit: 4: Min assist;HOB elevated Details for Bed Mobility Assistance: max directional v/c's Transfers Transfers: Sit to Stand;Stand to Sit Sit to Stand: 4: Min assist;With upper extremity assist;From bed Stand to Sit: 4: Min assist;With upper extremity assist;To chair/3-in-1 Details for Transfer Assistance: v/c's for hand placement Ambulation/Gait Ambulation/Gait Assistance: 4: Min assist Ambulation Distance (Feet): 50 Feet Assistive device: Rolling walker Ambulation/Gait Assistance Details: minA for walker management, directional verbal cues. Pt with decreased R LE step length Gait Pattern: Step-to pattern;Decreased step length - right;Decreased stance  time - right;Decreased weight shift to right;Narrow base of support Gait velocity: slow Stairs: No    Exercises     PT Diagnosis: Difficulty walking  PT Problem List: Decreased strength;Decreased balance;Decreased activity tolerance;Decreased cognition PT Treatment Interventions: DME instruction;Gait training;Functional mobility training;Therapeutic activities;Therapeutic exercise;Balance training   PT Goals Acute Rehab PT Goals PT Goal Formulation: With patient/family Time For Goal Achievement: 03/17/12 Potential to Achieve Goals: Good Pt will go Supine/Side to Sit: with HOB 0 degrees;with supervision PT Goal: Supine/Side to Sit - Progress: Goal set today Pt will go Sit to Supine/Side: with supervision;with HOB 0 degrees PT Goal: Sit to Supine/Side - Progress: Goal set today Pt will go Sit to Stand: with supervision;with upper extremity assist (up to RW) PT Goal: Sit to Stand - Progress: Goal set today Pt will Ambulate: >150 feet;with supervision;with rolling walker PT Goal: Ambulate - Progress: Goal set today  Visit Information  Last PT Received On: 03/10/12 Assistance Needed: +1    Subjective Data  Subjective: Pt received sitting up in bed pleasant and agreeable to PT.   Prior Functioning  Home Living Type of Home: Assisted living Additional Comments: per dtr pt was doing bathing/dressing I'ly. used 4 wheeled walk. had meals provided and medications managed Prior Function Level of Independence: Needs assistance Needs Assistance: Meal Prep Meal Prep: Total Able to Take Stairs?: No Driving: No Vocation: Retired Musician: Surveyor, mining Overall Cognitive Status: Impaired Area of Impairment: Memory Arousal/Alertness: Awake/alert Orientation Level: Disoriented to;Place;Time;Situation (pt able to recall 5 min later) Behavior During Session: [facility] for tasks performed Memory Deficits: unable to recall fall     Extremity/Trunk Assessment  Right Upper Extremity Assessment RUE ROM/Strength/Tone: [facility] for  tasks assessed Left Upper Extremity Assessment LUE ROM/Strength/Tone: [facility] for tasks assessed Right Lower Extremity Assessment RLE ROM/Strength/Tone: [facility] for tasks assessed Left Lower Extremity Assessment LLE ROM/Strength/Tone: [facility] for tasks assessed Trunk Assessment Trunk Assessment: Normal   Balance    End of Session PT - End of Session Equipment Utilized During Treatment: Gait belt Activity Tolerance: Patient tolerated treatment well Patient left: in chair;with call bell/phone within reach;with nursing in room Nurse Communication: Mobility status  GP     Marcene Brawn 03/10/2012, 5:29 PM  Lewis Shock, PT, DPT Pager #: [phone] Office #: [phone]

## 2012-03-10 NOTE — Progress Notes (Signed)
Utilization review completed.  

## 2012-03-10 NOTE — Progress Notes (Signed)
Stroke Team Rounding 79 year old female with a past medical history significant for hypertension, diabetes, admitted after sustaining 2 falls at her assisted living and becoming less responsive on 03/08/12. CT head shows small left frontal subdural hematoma. Patient is DNR status.  Subjective: Daughter at bedside. More alert and awake this am. From [facility], assisted living.  Objective: Vital signs in last 24 hours: Filed Vitals:   03/09/12 1937 03/10/12 0003 03/10/12 0409 03/10/12 0755  BP: 121/52 120/55 122/46 110/51  Pulse: 85 86 75 65  Temp: 98.8 F (37.1 C) 98.1 F (36.7 C) 98 F (36.7 C) 98 F (36.7 C)  TempSrc: Oral Oral Oral Oral  Resp: 17 23 17 21   Height:      Weight:      SpO2: 94% 97% 97% 97%     Intake/Output from previous day: 02/16 0701 - 02/17 0700 In: 1125 [I.V.:825; IV Piggyback:300] Out: 575 [Urine:575]  GENERAL EXAM: Patient is in no distress  CARDIOVASCULAR: Regular rate and rhythm, no murmurs, no carotid bruits  NEUROLOGIC: MENTAL STATUS:AWAKE. RESTLESS. Speak short sentences. Diminished attention, registration and recall. FOLLOWING  SIMPLE COMMANDS. POSITIVE SNOUT REFLEX. CRANIAL NERVE: pupils equal and reactive to light, BLINK TO THREAT IN ALL FIELDS. DECR EYE MOVEMENTS IN ALL DIRECTIONS TO COMMAND. FACE SYMM. LEFT ORBITAL ECCHYMOSIS.  MOTOR: normal bulk and tone, MOTOR IMPERSISTANCE. DIFFUSE 2-3/5.  SENSORY: WITHDRAWS TO STIM IN ALL EXT.   Lab Results:  Recent Labs  03/09/12 0548 03/10/12 0515  WBC 7.5 7.4  HGB 14.0 12.2  HCT 40.3 36.7  PLT 216 196    BMET  Recent Labs  03/09/12 0548 03/10/12 0515  NA 135 139  K 2.9* 3.8  CL 96 104  CO2 23 25  GLUCOSE 289* 146*  BUN 16 37*  CREATININE 0.68 0.98  CALCIUM 9.7 9.5    LIPIDS: No results found for this basename: chol,  trig,  hdl,  cholhdl,  vldl,  ldlcalc    HEMOGLOBIN A1C: Lab Results  Component Value Date   HGBA1C 6.4* 03/08/2012     Studies/Results: Ct Head Wo  Contrast  03/09/2012  *RADIOLOGY REPORT*  Clinical Data: Fall with intracranial hemorrhage - follow-up left subdural hematoma.  CT HEAD WITHOUT CONTRAST  Technique:  Contiguous axial images were obtained from the base of the skull through the vertex without contrast.  Comparison: 03/03/2012 CT and MRI.  Findings: Motion artifact decreases sensitivity.  A left frontotemporal and interhemispheric subdural hematoma is unchanged with 5 mm in maximal diameter. Atrophy and chronic small vessel white matter ischemic changes are again noted. No significant midline shift is identified on today's exam.  There is no evidence of new hemorrhage, acute infarction or hydrocephalus.  No acute bony abnormalities are identified.  IMPRESSION: Unchanged left frontotemporal/interhemispheric subdural hematoma with maximal diameter 5 mm.  No new abnormalities or changes.   Original Report Authenticated By: Harmon Pier, M.D.    Ct Head Wo Contrast  03/08/2012  *RADIOLOGY REPORT*  Clinical Data:  79 year old female with head, face and neck injury following fall.  Headache, neck pain and facial pain and swelling. Altered mental status and confusion.  CT HEAD WITHOUT CONTRAST CT MAXILLOFACIAL WITHOUT CONTRAST CT CERVICAL SPINE WITHOUT CONTRAST  Technique:  Multidetector CT imaging of the head, cervical spine, and maxillofacial structures were performed using the standard protocol without intravenous contrast. Multiplanar CT image reconstructions of the cervical spine and maxillofacial structures were also generated.  Comparison:  01/01/2012 and 08/08/2007 CTs  CT HEAD  Findings: A left frontotemporal/anterior left parafalcine subdural hematoma is identified measuring 5 mm in greatest diameter. 2 mm left to right midline shift is noted.  Chronic small vessel white matter ischemic changes are noted with heavy vascular calcifications.  There is no evidence of acute infarction, hydrocephalus or mass lesion. No acute or suspicious bony  abnormalities are noted.  IMPRESSION: Left frontotemporal/anterior left parafalcine subdural hematoma measuring 5 mm in greatest diameter with 2 mm left to right midline shift.  Chronic small vessel white matter ischemic changes.  CT MAXILLOFACIAL  Findings:  Left facial soft tissue swelling is identified. There is no evidence of acute fracture, subluxation or dislocation. The paranasal sinuses, mastoid air cells and middle/. The orbits and globes are unremarkable. No focal bony lesions are present.  IMPRESSION: Left facial soft tissue swelling without fracture.  CT CERVICAL SPINE  Findings:   Motion artifact degrades images at the C7-T1 articulation.  There is no evidence of acute fracture or subluxation. No prevertebral soft tissue swelling is noted. Moderate degenerative disc disease and spondylosis from C4-T1 noted. Mild to moderate facet arthropathy throughout the cervical spine identified. No soft tissue abnormalities are identified.  IMPRESSION: No static evidence of acute injury to the spine.  Please note that motion artifact slightly limits evaluation.  Multilevel degenerative changes.  Critical Value/emergent results were called by telephone at the time of interpretation on 03/08/2012 at 2:30 p.m. to Dr. Ranae Palms, who verbally acknowledged these results.   Original Report Authenticated By: Harmon Pier, M.D.    Ct Cervical Spine Wo Contrast  03/08/2012  *RADIOLOGY REPORT*  Clinical Data:  79 year old female with head, face and neck injury following fall.  Headache, neck pain and facial pain and swelling. Altered mental status and confusion.  CT HEAD WITHOUT CONTRAST CT MAXILLOFACIAL WITHOUT CONTRAST CT CERVICAL SPINE WITHOUT CONTRAST  Technique:  Multidetector CT imaging of the head, cervical spine, and maxillofacial structures were performed using the standard protocol without intravenous contrast. Multiplanar CT image reconstructions of the cervical spine and maxillofacial structures were also  generated.  Comparison:  01/01/2012 and 08/08/2007 CTs  CT HEAD  Findings: A left frontotemporal/anterior left parafalcine subdural hematoma is identified measuring 5 mm in greatest diameter. 2 mm left to right midline shift is noted.  Chronic small vessel white matter ischemic changes are noted with heavy vascular calcifications.  There is no evidence of acute infarction, hydrocephalus or mass lesion. No acute or suspicious bony abnormalities are noted.  IMPRESSION: Left frontotemporal/anterior left parafalcine subdural hematoma measuring 5 mm in greatest diameter with 2 mm left to right midline shift.  Chronic small vessel white matter ischemic changes.  CT MAXILLOFACIAL  Findings:  Left facial soft tissue swelling is identified. There is no evidence of acute fracture, subluxation or dislocation. The paranasal sinuses, mastoid air cells and middle/. The orbits and globes are unremarkable. No focal bony lesions are present.  IMPRESSION: Left facial soft tissue swelling without fracture.  CT CERVICAL SPINE  Findings:   Motion artifact degrades images at the C7-T1 articulation.  There is no evidence of acute fracture or subluxation. No prevertebral soft tissue swelling is noted. Moderate degenerative disc disease and spondylosis from C4-T1 noted. Mild to moderate facet arthropathy throughout the cervical spine identified. No soft tissue abnormalities are identified.  IMPRESSION: No static evidence of acute injury to the spine.  Please note that motion artifact slightly limits evaluation.  Multilevel degenerative changes.  Critical Value/emergent results were called by telephone at the time of interpretation on  03/08/2012 at 2:30 p.m. to Dr. Ranae Palms, who verbally acknowledged these results.   Original Report Authenticated By: Harmon Pier, M.D.    Mr Brain Wo Contrast  03/08/2012  *RADIOLOGY REPORT*  Clinical Data: New mental status changes.  Recent fall.  MRI HEAD WITHOUT CONTRAST  Technique:  Multiplanar,  multiecho pulse sequences of the brain and surrounding structures were obtained according to standard protocol without intravenous contrast.  Comparison: CT head earlier today.  Findings: The patient had difficulty remaining motionless for the study.  Images are suboptimal.  Small or subtle lesions could be overlooked.  The study is marginally diagnostic.  There is too much motion degradation to exclude a small infarct. There is no large cortical infarction observed.  There is advanced atrophy with chronic microvascular ischemic change.  There is no parenchymal hemorrhage.  A 5 mm thick acute left frontotemporal and interhemispheric subdural hematoma is noted.  There is only minimal if any midline shift.  When technique differences are considered, the hematoma appears slightly increased in size from priors.  Flow voids are maintained in the carotid and basilar arteries.  IMPRESSION: There is too much motion degradation to exclude a small cerebral infarction.  No large vessel cortical infarct is observed however.  5 mm thick left frontotemporal and interhemispheric subdural hematoma appears acute and slightly increased in size when compared with earlier CT.  Because of the degree of atrophy and chronic microvascular ischemic change, there is no significant mass effect or midline shift.   Original Report Authenticated By: Davonna Belling, M.D.    Ct Maxillofacial Wo Cm  03/08/2012  *RADIOLOGY REPORT*  Clinical Data:  79 year old female with head, face and neck injury following fall.  Headache, neck pain and facial pain and swelling. Altered mental status and confusion.  CT HEAD WITHOUT CONTRAST CT MAXILLOFACIAL WITHOUT CONTRAST CT CERVICAL SPINE WITHOUT CONTRAST  Technique:  Multidetector CT imaging of the head, cervical spine, and maxillofacial structures were performed using the standard protocol without intravenous contrast. Multiplanar CT image reconstructions of the cervical spine and maxillofacial structures were  also generated.  Comparison:  01/01/2012 and 08/08/2007 CTs  CT HEAD  Findings: A left frontotemporal/anterior left parafalcine subdural hematoma is identified measuring 5 mm in greatest diameter. 2 mm left to right midline shift is noted.  Chronic small vessel white matter ischemic changes are noted with heavy vascular calcifications.  There is no evidence of acute infarction, hydrocephalus or mass lesion. No acute or suspicious bony abnormalities are noted.  IMPRESSION: Left frontotemporal/anterior left parafalcine subdural hematoma measuring 5 mm in greatest diameter with 2 mm left to right midline shift.  Chronic small vessel white matter ischemic changes.  CT MAXILLOFACIAL  Findings:  Left facial soft tissue swelling is identified. There is no evidence of acute fracture, subluxation or dislocation. The paranasal sinuses, mastoid air cells and middle/. The orbits and globes are unremarkable. No focal bony lesions are present.  IMPRESSION: Left facial soft tissue swelling without fracture.  CT CERVICAL SPINE  Findings:   Motion artifact degrades images at the C7-T1 articulation.  There is no evidence of acute fracture or subluxation. No prevertebral soft tissue swelling is noted. Moderate degenerative disc disease and spondylosis from C4-T1 noted. Mild to moderate facet arthropathy throughout the cervical spine identified. No soft tissue abnormalities are identified.  IMPRESSION: No static evidence of acute injury to the spine.  Please note that motion artifact slightly limits evaluation.  Multilevel degenerative changes.  Critical Value/emergent results were called by telephone at  the time of interpretation on 03/08/2012 at 2:30 p.m. to Dr. Ranae Palms, who verbally acknowledged these results.   Original Report Authenticated By: Harmon Pier, M.D.    Medications:  . insulin aspart  0-5 Units Subcutaneous QHS  . insulin aspart  0-9 Units Subcutaneous TID WC  . metoprolol  5 mg Intravenous Q6H   . sodium  chloride 75 mL/hr at 03/08/12 2224     Assessment: 79 y.o. female with a past medical history significant for hypertension, diabetes, here with traumatic subdural hematoma (left frontal) following a fall, stable. No acute stroke.   LOS: 2 days   Plan: - no further stroke workup indicated - agree with plans to transfer to the floor - PT and OT evals to determine return disposition, to [facility] assisted living or short term skilled - Stroke Service will sign off. Please call should any needs arise. - no neurologic follow up indicated  Annie Main, MSN, RN, ANVP-BC, ANP-BC, GNP-BC [facility] Pager: [phone] 03/10/2012 10:17 AM  I have personally obtained a history, examined the patient, evaluated imaging results, and formulated the assessment and plan of care. I agree with the above. Delia Heady, MD Medical Director [facility] Pager: [phone] 03/10/2012 5:34 PM

## 2012-03-10 NOTE — Progress Notes (Addendum)
Pt TX to 4N-24, VSS, called report. Family apprised of TX and bed assignment.

## 2012-03-10 NOTE — Progress Notes (Addendum)
Neurological status improved this am; Pt verbal, passed swallow eval, following commands. VSS, will continue to monitor.

## 2012-03-10 NOTE — Progress Notes (Signed)
Pt urine out put 21 cc's per hour over the last 12 hours paged Lenny Pastel NP who ordered 500 cc bolus, will contiue to monitor.

## 2012-03-10 NOTE — Progress Notes (Signed)
[facility] Progress Note [place] TEAM 1 - Stepdown/ICU TEAM   DENICE CARDON QMV:[id number] DOB: 1918/02/27 DOA: 03/08/2012 PCP: Kimber Relic, MD  Brief narrative: 79 year old female with history of early dementia, diabetes, hypertension, recurrent falls, resident of assisted living at [facility] presented to ED after a fall. History was obtained from the patient's daughter on the phone, [name] [phone]) who stated that patient was having falls recently. She was in independent living facility till November when she had a fall and was upgraded to assisted living. The day of admission the patient had mechanical fall twice in the morning. The facility reported to the daughter that patient was somewhat leaning towards the left and lost her balance and had slurred speech. When the patient's daughter arrived in the ER, patient did not recognize her daughter.  ED workup showed hypokalemia with potassium of 3.1, dehydration (positive ketones in the urine). CT head showed left frontotemporal/anterior left parafalcine subdural hepatoma 5mm, 2mm left to right midline shift. [name] blood pressure was elevated at 218/93, she was placed on a Cardene drip which was weaned off. PCCM was consulted and recommended step-down admission.  Assessment/Plan:  Subdural hematoma -evaluated by Neurology - surgical intervention was not indicated  -Repeat CT head 03/09/12 showed stability  Fall at nursing home -PT/OT evaluation - may need SNF for rehab stay at time of d/c   Malignant hypertension -no evidence of CVA on brain imaging  -now off IV gtt -resume home meds but at lower dose -current SBP 120's  Encephalopathy - baseline dementia + post-concussion syndrome -Alert but confused today - passed swallow eval.  Diabetes mellitus - diet controlled  -Does not appear to be on medication as outpatient per med rec -on a sensitive sliding scale for now  Hypokalemia -Replacing - follow  trend   Laceration/contusion- left eye/face -monitor for infection  Dementia -Stable without agitation  Code Status: DO NOT RESUSCITATE Family Communication: spoke to daughter at length at bedside Disposition Plan: Transfer to neuro floor  Consultants: Neurosurgery Neurology  Antibiotics: None  DVT prophylaxis: SCDs  HPI/Subjective: Patient is alert and confused, eating biscuit and feeding self. Dtr at bedside.  Objective: Blood pressure 129/52, pulse 77, temperature 98.1 F (36.7 C), temperature source Oral, resp. rate 16, height 5' (1.524 m), weight 59.7 kg (131 lb 9.8 oz), SpO2 95.00%.  Intake/Output Summary (Last 24 hours) at 03/10/12 1320 Last data filed at 03/10/12 1200  Gross per 24 hour  Intake   1050 ml  Output    475 ml  Net    575 ml   Exam: General: No acute respiratory distress Lungs: Clear to auscultation bilaterally without wheezes or crackles ENT: Unremarkable except for small laceration with contusion left periorbital region Cardiovascular: Regular rate and rhythm without murmur gallop or rub normal S1 and S2, no peripheral edema or JVD Abdomen: Nontender, nondistended, soft, bowel sounds positive, no rebound, no ascites, no appreciable mass Extremities: No significant cyanosis, clubbing of bilateral lower extremities Neurological: Patient is awake and alert although confused, moving all extremities x4 without any focal deficits, cranial nerves II through XII grossly intact  Data Reviewed: Basic Metabolic Panel:  Recent Labs Lab 03/08/12 1420 03/09/12 0548 03/10/12 0515  NA 131* 135 139  K 3.1* 2.9* 3.8  CL 94* 96 104  CO2 25 23 25   GLUCOSE 178* 289* 146*  BUN 12 16 37*  CREATININE 0.50 0.68 0.98  CALCIUM 10.3 9.7 9.5   Liver Function Tests:  Recent Labs  Lab 03/08/12 1420  AST 21  ALT 14  ALKPHOS 83  BILITOT 0.5  PROT 7.5  ALBUMIN 4.2   CBC:  Recent Labs Lab 03/08/12 1420 03/09/12 0548 03/10/12 0515  WBC 5.6 7.5 7.4   NEUTROABS 4.7  --   --   HGB 14.3 14.0 12.2  HCT 41.4 40.3 36.7  MCV 83.3 81.6 84.2  PLT 179 216 196   CBG:  Recent Labs Lab 03/09/12 1709 03/09/12 2137 03/10/12 0756 03/10/12 1303  GLUCAP 225* 140* 142* 183*    Recent Results (from the past 240 hour(s))  URINE CULTURE     Status: None   Collection Time    03/08/12  3:31 PM      Result Value Range Status   Specimen Description URINE, CATHETERIZED   Final   Special Requests ADDED ON 03/08/12 AT 1821   Final   Culture  Setup Time 03/09/2012 01:46   Final   Colony Count NO GROWTH   Final   Culture NO GROWTH   Final   Report Status 03/10/2012 FINAL   Final  MRSA PCR SCREENING     Status: None   Collection Time    03/08/12 10:32 PM      Result Value Range Status   MRSA by PCR NEGATIVE  NEGATIVE Final   Comment:            The GeneXpert MRSA Assay (FDA     approved for NASAL specimens     only), is one component of a     comprehensive MRSA colonization     surveillance program. It is not     intended to diagnose MRSA     infection nor to guide or     monitor treatment for     MRSA infections.     Studies:  Recent x-ray studies have been reviewed in detail by the Attending Physician  Scheduled Meds:  Reviewed in detail by the Attending Physician   Algernon Huxley Pager: [phone]  If 7PM-7AM, please contact night-coverage [email or web address] Password [email or web address] 03/10/2012, 1:20 PM   LOS: 2 days   I have personally examined this patient and reviewed the entire database. I have reviewed the above note, made any necessary editorial changes, and agree with its content.  Lonia Blood, MD [facility]

## 2012-03-10 NOTE — Evaluation (Signed)
Speech Language Pathology Evaluation Patient Details Name: Teresa Richards MRN: [id number] DOB: 21-Aug-1918 Today's Date: 03/10/2012 Time: 8295-6213 SLP Time Calculation (min): 34 min  Problem List:  Patient Active Problem List  Diagnosis  . Fall at nursing home  . Subdural hematoma  . Malignant hypertension  . Diabetes mellitus  . Laceration- left eye/face  . Dementia   Past Medical History:  Past Medical History  Diagnosis Date  . Diabetes mellitus without complication   . Hypertension   . Melanoma   . Dementia    Past Surgical History: No past surgical history on file. HPI:  Patient is a 79 year old female with history of early dementia, diabetes, hypertension, recurrent falls, resident of assisted living facility at [facility] presented to ED after a fall. At the time of my encounter, patient is only oriented to herself and was unable to provide any history. History was obtained from the patient's daughter on the phone, [name] [phone]) who stated that patient is having falls recently. She was in independent living facility till November when she had a fall and was upgraded to a sister living facility. Today patient had mechanical fall twice in the morning. The facility reported to the daughter that patient was somewhat leaning towards the left and lost her balance and had slurred speech. When the patient's daughter arrived in the ER, patient did not recognize her daughter. The daughter also reported that patient is having episodes of confusion in the last 2 months.     Assessment / Plan / Recommendation Clinical Impression  Pt presents with worsened mentation from baseline per report from daughter. At baseline pt was at ALF, taking care of basic ADL's, learning new tasks with assistance, mildly slurred speech for the past two months. Language WNL. Today pt with difficulty with basic functional problem solving, selective attention, word finding in conversation. Pt responds well to  therapeutic intervention, verbal contextual cues. Given that pt was nonverbal yesterday, suspect mentation may rapidly return to baseline. Regardless, pt would benefit from f/u with SLP to facilitate cognitive recovery and determine need for f/u SLP at d/c.     SLP Assessment  Patient needs continued Speech Lanaguage Pathology Services    Follow Up Recommendations  Skilled Nursing facility    Frequency and Duration min 2x/week  2 weeks   Pertinent Vitals/Pain NA   SLP Goals  SLP Goals Potential to Achieve Goals: Good Progress/Goals/Alternative treatment plan discussed with pt/caregiver and they: Agree SLP Goal #1: Pt will demonstrate orientation x4 with min use of environmental cues.  SLP Goal #2: Pt will complete basic functional task in minimally distracting environment. with min verbal cues.   SLP Evaluation Prior Functioning  Cognitive/Linguistic Baseline: Baseline deficits Baseline deficit details: early dementia, pt taking care of basic ADL's, frequent falls, slurred speech over past 2-3 months family wonders if this is due to medications.  Type of Home: Assisted living Vocation: Retired   IT consultant  Overall Cognitive Status: Impaired Arousal/Alertness: Awake/alert Orientation Level: Oriented to person;Disoriented to place;Disoriented to time;Disoriented to situation (able to orient to time with cues) Attention: Focused;Sustained;Selective Focused Attention: Appears intact Sustained Attention: Appears intact Selective Attention: Impaired Selective Attention Impairment: Functional basic Memory: Impaired Memory Impairment: Decreased short term memory (difficulty recalling address basic info "I cant think") Decreased Short Term Memory: Verbal basic;Functional basic Awareness: Impaired Awareness Impairment: Intellectual impairment;Emergent impairment Problem Solving: Impaired Problem Solving Impairment: Verbal basic;Functional basic Safety/Judgment: Impaired (wanted to  get up out of bed, able to repeat precautions )  Comprehension  Auditory Comprehension Overall Auditory Comprehension: Appears within functional limits for tasks assessed Yes/No Questions: Within Functional Limits Commands: Impaired One Step Basic Commands: 75-100% accurate Two Step Basic Commands: 75-100% accurate Multistep Basic Commands: 75-100% accurate Complex Commands: 25-49% accurate    Expression Expression Primary Mode of Expression: Verbal Verbal Expression Overall Verbal Expression: Impaired Initiation: No impairment Automatic Speech: Name;Social Response Level of Generative/Spontaneous Verbalization: Phrase;Sentence Repetition: No impairment Naming: No impairment Other Verbal Expression Comments: No difficulty with naming, but some word finding errors in conversation, neologisms.    Oral / Motor Oral Motor/Sensory Function Overall Oral Motor/Sensory Function: Appears within functional limits for tasks assessed Motor Speech Overall Motor Speech: Impaired at baseline Respiration: Within functional limits Phonation: Normal Resonance: Within functional limits Articulation: Impaired Level of Impairment: Word Intelligibility: Intelligibility reduced Word: 75-100% accurate Phrase: 75-100% accurate Sentence: 75-100% accurate Conversation: 75-100% accurate   GO    Harlon Ditty, MA CCC-SLP [phone]  Claudine Mouton 03/10/2012, 9:01 AM

## 2012-03-10 NOTE — Progress Notes (Addendum)
Speech Language Pathology Dysphagia Treatment Patient Details Name: Teresa Richards MRN: [id number] DOB: Nov 19, 1918 Today's Date: 03/10/2012 Time: 0810-0830 SLP Time Calculation (min): 20 min  Assessment / Plan / Recommendation Clinical Impression  Pt much more alert today than with initial BSE. Pt demonstrates normal function with thin liquids, no evidence of aspiration. Pt able to masticate solids, but dry oral mucosa leads to moderate residuals in anterior oral cavity that pt struggles to clear. At this time, recommend a dys 3 (fine chop) diet with thin liquids. SLP will f/u x1 for diet upgrade as pt improves in conjunction with cognitive therapy.     Diet Recommendation  Initiate / Change Diet: Dysphagia 3 (mechanical soft);Thin liquid    SLP Plan Goals updated   Pertinent Vitals/Pain NA   Swallowing Goals  SLP Swallowing Goals Patient will consume recommended diet without observed clinical signs of aspiration with: Supervision/safety Swallow Study Goal #1 - Progress: Progressing toward goal Patient will utilize recommended strategies during swallow to increase swallowing safety with: Minimal cueing Swallow Study Goal #2 - Progress: Progressing toward goal Goal #3: Patient will maintain LOA and sustained attention for 15 minutes to safely consume diagnostic PO trials of various consistencies to assess for PO readiness Swallow Study Goal #3 - Progress: Met  General Temperature Spikes Noted: No Respiratory Status: Supplemental O2 delivered via (comment) Behavior/Cognition: Cooperative;Alert Oral Cavity - Dentition: Adequate natural dentition Patient Positioning: Upright in bed  Oral Cavity - Oral Hygiene Patient is HIGH RISK - Oral Care Protocol followed (see row info): Yes   Dysphagia Treatment Treatment focused on: Skilled observation of diet tolerance;Patient/family/caregiver education Family/Caregiver Educated: daughter Treatment Methods/Modalities: Skilled  observation Patient observed directly with PO's: Yes Type of PO's observed: Regular;Thin liquids Feeding: Able to feed self Liquids provided via: Cup Oral Phase Signs & Symptoms: Prolonged mastication;Prolonged bolus formation;Prolonged oral phase Type of cueing: Verbal Amount of cueing: Minimal   GO     Tanaya Dunigan, Riley Nearing 03/10/2012, 9:06 AM

## 2012-03-11 LAB — CBC
HCT: 35 % — ABNORMAL LOW (ref 36.0–46.0)
MCHC: 34 g/dL (ref 30.0–36.0)
Platelets: 168 10*3/uL (ref 150–400)
RDW: 15.6 % — ABNORMAL HIGH (ref 11.5–15.5)

## 2012-03-11 LAB — GLUCOSE, CAPILLARY
Glucose-Capillary: 167 mg/dL — ABNORMAL HIGH (ref 70–99)
Glucose-Capillary: 119 mg/dL — ABNORMAL HIGH (ref 70–99)

## 2012-03-11 LAB — BASIC METABOLIC PANEL
BUN: 24 mg/dL — ABNORMAL HIGH (ref 6–23)
GFR calc Af Amer: 87 mL/min — ABNORMAL LOW (ref >90)
GFR calc non Af Amer: 75 mL/min — ABNORMAL LOW (ref >90)
Potassium: 3.6 mEq/L (ref 3.5–5.1)

## 2012-03-11 NOTE — Clinical Social Work Note (Signed)
Clinical Social Work   CSW completed [employer] and sent referral to [facility] and [facility]. Per admissions coordinator, there is not a bed available at [facility]. Admissions coordinator will be working with sister facility, [facility] on bed availability. CSW will continue to follow.   Dede Query, MSW, Theresia Majors [phone]

## 2012-03-11 NOTE — Progress Notes (Signed)
Occupational Therapy Evaluation Patient Details Name: Teresa Richards MRN: [id number] DOB: 03-Apr-1918 Today's Date: 03/11/2012 Time: 4098-1191 OT Time Calculation (min): 29 min  OT Assessment / Plan / Recommendation Clinical Impression  79 yo s/p fall with resulting L frontotemporal SDH. PTA, pt lived at ALF at [facility] and was independent with self care and mod I with mobility with RW. Pt with functional decline with significant cognitive and balance deficits affecting her ability to D/C back to ALF. Pt will need rehab at SNF to return to PLOF. Pt will benefit from OT services to faciliate D/C to SNF.    OT Assessment  Patient needs continued OT Services    Follow Up Recommendations  SNF    Barriers to Discharge None    Equipment Recommendations  None recommended by OT    Recommendations for Other Services    Frequency  Min 2X/week    Precautions / Restrictions Precautions Precautions: Fall Precaution Comments: cognitive deficits Restrictions Weight Bearing Restrictions: No   Pertinent Vitals/Pain No pain. Vitals stable    ADL  Grooming: Supervision/safety;Set up Where Assessed - Grooming: Supported sitting Upper Body Bathing: Supervision/safety;Set up Where Assessed - Upper Body Bathing: Supported sitting Lower Body Bathing: Minimal assistance Where Assessed - Lower Body Bathing: Unsupported sitting;Supported standing Upper Body Dressing: Supervision/safety;Set up Where Assessed - Upper Body Dressing: Supported sitting Lower Body Dressing: Minimal assistance Where Assessed - Lower Body Dressing: Supported sit to stand Toilet Transfer: Minimal assistance Toilet Transfer Method: Sit to stand;Stand pivot Acupuncturist: Comfort height toilet Equipment Used: Gait belt;Rolling walker Transfers/Ambulation Related to ADLs: Min A ADL Comments: functional decline. limited by impaired cognition    OT Diagnosis: Generalized weakness;Cognitive deficits  OT  Problem List: Decreased strength;Decreased activity tolerance;Impaired balance (sitting and/or standing);Decreased cognition;Decreased safety awareness;Decreased knowledge of precautions OT Treatment Interventions: Self-care/ADL training;Therapeutic exercise;Energy conservation;DME and/or AE instruction;Therapeutic activities;Cognitive remediation/compensation;Patient/family education;Balance training   OT Goals Acute Rehab OT Goals OT Goal Formulation: With patient Time For Goal Achievement: 03/25/12 Potential to Achieve Goals: Good ADL Goals Pt Will Perform Grooming: with supervision;Standing at sink;Unsupported;with cueing (comment type and amount) (min vc in min distractive environment) ADL Goal: Grooming - Progress: Goal set today Pt Will Perform Lower Body Bathing: with supervision;Unsupported;Sit to stand from chair;with cueing (comment type and amount) (min vc min distractions) ADL Goal: Lower Body Bathing - Progress: Goal set today Pt Will Transfer to Toilet: with supervision;with DME;Ambulation ADL Goal: Toilet Transfer - Progress: Goal set today Pt Will Perform Toileting - Clothing Manipulation: with supervision;Standing ADL Goal: Toileting - Clothing Manipulation - Progress: Goal set today Pt Will Perform Toileting - Hygiene: with supervision;Sit to stand from 3-in-1/toilet ADL Goal: Toileting - Hygiene - Progress: Goal set today Additional ADL Goal #1: Pt will complete 2 step task with min vc for redirection in min distracting environment ADL Goal: Additional Goal #1 - Progress: Goal set today  Visit Information  Last OT Received On: 03/11/12 Assistance Needed: +1    Subjective Data      Prior Functioning     Home Living Lives With: Other (Comment);Alone (independent living) Available Help at Discharge: Other (Comment) (SNF) Type of Home: Independent living facility Additional Comments: Pt states she lived in independent living. 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, water quality: HOH  Vision/Perception Vision - History Baseline Vision: Wears glasses all the time Perception Perception: Within Functional Limits Praxis Praxis: Impaired Praxis Impairment Details: Initiation (delay in intiation)   Cognition  Cognition Overall Cognitive Status: Impaired Area of Impairment: Attention;Memory;Safety/judgement;Awareness of errors;Awareness of deficits;Problem solving;Executive functioning Arousal/Alertness: Awake/alert Orientation Level: Oriented X4 / Intact Behavior During Session: WFL for tasks performed Current Attention Level: Sustained Attention - Other Comments: internally and externally distracted Memory: Decreased recall of precautions Memory Deficits: unable to recall task to do after 1 min delay Safety/Judgement: Decreased awareness of safety precautions;Decreased safety judgement for tasks assessed Safety/Judgement - Other Comments: states she feels she is safe to walk around Awareness of Errors: Assistance required to identify errors made;Assistance required to correct errors made (mod cues to assist wit identifying errors) Awareness of Errors - Other Comments: emergent awareness developing Awareness of Deficits: decreased Problem Solving: mod a functional basic Executive Functioning: imapired    Extremity/Trunk Assessment Right Upper Extremity Assessment RUE ROM/Strength/Tone: WFL for tasks assessed RUE Sensation: WFL - Light Touch;WFL - Proprioception RUE Coordination: WFL - gross/fine motor Left Upper Extremity Assessment LUE ROM/Strength/Tone: WFL for tasks assessed LUE Sensation: WFL - Light Touch;WFL - Proprioception LUE Coordination: WFL - gross/fine motor Right Lower  Extremity Assessment RLE ROM/Strength/Tone: WFL for tasks assessed RLE Sensation: WFL - Light Touch;WFL - Proprioception RLE Coordination: WFL - gross/fine motor Left Lower Extremity Assessment LLE ROM/Strength/Tone: WFL for tasks assessed LLE Sensation: WFL - Light Touch;WFL - Proprioception LLE Coordination: WFL - gross/fine motor Trunk Assessment Trunk Assessment: Other exceptions (r bias at times)     Mobility Bed Mobility Bed Mobility: Supine to Sit;Sitting - Scoot to Edge of Bed Supine to Sit: 5: Supervision;With rails;HOB flat Sitting - Scoot to Edge of Bed: 5: Supervision;With rail Details for Bed Mobility Assistance: vc for initation and task completion Transfers Transfers: Sit to Stand;Stand to Sit Sit to Stand: 4: Min assist;With upper extremity assist;From bed Stand to Sit: 4: Min assist;With upper extremity assist;To bed Details for Transfer Assistance: v/c's for hand placement     Exercise     Balance Balance Balance Assessed: Yes Static Sitting Balance Static Sitting - Balance Support: No upper extremity supported;Feet supported (SBA) Static Standing Balance Static Standing - Balance Support: Bilateral upper extremity supported Static Standing - Level of Assistance: 4: Min assist;Other (comment) (R bias)   End of Session OT - End of Session Equipment Utilized During Treatment: Gait belt Activity Tolerance: Patient tolerated treatment well Patient left: in bed;with call bell/phone within reach;with bed alarm set Nurse Communication: Mobility status  GO     Kalub Morillo,HILLARY 03/11/2012, 11:26 AM Luisa Dago, OTR/L  [phone] 03/11/2012

## 2012-03-11 NOTE — Progress Notes (Signed)
Physical Therapy Treatment Patient Details Name: Teresa Richards MRN: [id number] DOB: September 30, 1918 Today's Date: 03/11/2012 Time: 4132-4401 PT Time Calculation (min): 19 min  PT Assessment / Plan / Recommendation Comments on Treatment Session  Pt. with improved mobility and noted emergent awareness during session.  Pt. appears to demonstrate improved safety awareness acknowledging to call for assistance without being prompted.    Follow Up Recommendations  SNF;Supervision/Assistance - 24 hour     Does the patient have the potential to tolerate intense rehabilitation     Barriers to Discharge        Equipment Recommendations  None recommended by PT    Recommendations for Other Services    Frequency Min 3X/week   Plan Discharge plan remains appropriate;Frequency remains appropriate    Precautions / Restrictions Precautions Precautions: Fall Precaution Comments: cognitive deficits Restrictions Weight Bearing Restrictions: No   Pertinent Vitals/Pain Pt. Denies pain    Mobility  Bed Mobility Bed Mobility: Supine to Sit;Sitting - Scoot to Edge of Bed Supine to Sit: 5: Supervision;HOB flat Sitting - Scoot to Edge of Bed: 5: Supervision;With rail Transfers Transfers: Sit to Stand;Stand to Sit Sit to Stand: 4: Min guard;With upper extremity assist;From bed Stand to Sit: 4: Min guard;To chair/3-in-1;With upper extremity assist Details for Transfer Assistance: VCs for safe hand placement Ambulation/Gait Ambulation/Gait Assistance: 4: Min guard Ambulation Distance (Feet): 200 Feet Assistive device: Rolling walker Ambulation/Gait Assistance Details: VCs for increased step length Gait Pattern: Step-to pattern;Decreased step length - right;Decreased stance time - right;Decreased weight shift to right;Narrow base of support Gait velocity: slow Stairs: No Wheelchair Mobility Wheelchair Mobility: No    Exercises     PT Diagnosis:    PT Problem List:   PT Treatment Interventions:      PT Goals Acute Rehab PT Goals PT Goal: Supine/Side to Sit - Progress: Progressing toward goal PT Goal: Sit to Stand - Progress: Progressing toward goal PT Goal: Ambulate - Progress: Progressing toward goal  Visit Information  Last PT Received On: 03/11/12 Assistance Needed: +1    Subjective Data  Subjective: Pt. eager to ambulate   Cognition  Cognition Overall Cognitive Status: Impaired Area of Impairment: Attention;Memory;Safety/judgement;Awareness of errors;Awareness of deficits;Problem solving;Executive functioning Arousal/Alertness: Awake/alert Orientation Level: Oriented X4 / Intact Behavior During Session: WFL for tasks performed Current Attention Level: Sustained Attention - Other Comments: attention appeared improved today with minimal distractions Memory: Decreased recall of precautions Memory Deficits: improved recall of recommendations for safety Safety/Judgement: Decreased awareness of safety precautions Safety/Judgement - Other Comments: stated she is aware to call for assistance Awareness of Errors - Other Comments: demonstrated emergent awareness Awareness of Deficits: decreased    Balance  Balance Balance Assessed: Yes Static Sitting Balance Static Sitting - Balance Support: No upper extremity supported;Feet supported Static Standing Balance Static Standing - Balance Support: Bilateral upper extremity supported Static Standing - Level of Assistance: 5: Stand by assistance  End of Session PT - End of Session Equipment Utilized During Treatment: Gait belt Activity Tolerance: Patient tolerated treatment well Patient left: in chair;with call bell/phone within reach Nurse Communication: Mobility status;Other (comment) (pt. in chair)   GP     Feltis, Kimika Streater K 03/11/2012, 2:18 PM  Nicki Reaper. Feltis, PT, DPT [phone]

## 2012-03-11 NOTE — Progress Notes (Signed)
Speech Language Pathology Treatment Patient Details Name: Teresa Richards MRN: [id number] DOB: 09/05/1918 Today's Date: 03/11/2012 Time: 5621-3086 SLP Time Calculation (min): 30 min  Assessment / Plan / Recommendation    SLP Plan  Continue with current plan of care       SLP Goals  SLP Goals SLP Goal #1 - Progress: Met SLP Goal #2 - Progress: Progressing toward goal  General Behavior/Cognition: Alert;Cooperative;Pleasant mood Oral Cavity - Dentition: Adequate natural dentition Patient Positioning: Upright in bed  Oral Cavity - Oral Hygiene Does patient have any of the following "at risk" factors?: None of the above Brush patient's teeth BID with toothbrush (using toothpaste with fluoride): Yes   Treatment Treatment focused on: Cognition;Other (comment) (dysphagia ) Skilled Treatment: Patient seen for diet tolerance of dysphagia 3 diet consistency and thin liquids following initiation of diet on 03/10/12. Patient observed directly with PO's at noon meal.  No outward s/s of aspiration noted with functional oral and pharyngeal phase for current diet.  Cognition also addressed.  Patient oriented x4 w/o no cues required to look at external aids.  Patient required min encouragement to complete meal as she perseverated on disliking the food.  Moderate cues required for patient to correctly use call light.  Daughter present during treatment reports patient attempting to get up w/o assistance.  Recommend continued  ST to follow briefly in acute care setting for possible diet upgrade and address problem solving.  Recommend continued  ST at next level of care to address cognitive deficits to increase safety.     GO    Moreen Fowler MS, CCC-SLP [phone] [facility] 03/11/2012, 2:52 PM

## 2012-03-11 NOTE — Progress Notes (Signed)
Doing well. No c/o - up in chair  Temp:  [97.2 F (36.2 C)-98.8 F (37.1 C)] 98.8 F (37.1 C) (02/18 0939) Pulse Rate:  [69-90] 83 (02/18 0939) Resp:  [16-20] 20 (02/18 0939) BP: (120-168)/(48-73) 156/70 mmHg (02/18 0939) SpO2:  [95 %-97 %] 95 % (02/18 0939) AAOx2++ - FC all 4 Plan: Looks much better  Today - CPM -

## 2012-03-12 LAB — GLUCOSE, CAPILLARY

## 2012-03-12 MED ORDER — POTASSIUM CHLORIDE CRYS ER 20 MEQ PO TBCR
20.0000 meq | EXTENDED_RELEASE_TABLET | Freq: Two times a day (BID) | ORAL | Status: DC
  Filled 2012-03-12 (×3): qty 1

## 2012-03-12 MED ORDER — LISINOPRIL 40 MG PO TABS
40.0000 mg | ORAL_TABLET | Freq: Every day | ORAL | Status: DC
  Administered 2012-03-13 – 2012-03-17 (×5): 40 mg via ORAL
  Filled 2012-03-12 (×5): qty 1

## 2012-03-12 NOTE — Clinical Social Work Placement (Addendum)
Clinical Social Work Department CLINICAL SOCIAL WORK PLACEMENT NOTE 03/12/2012  Patient:  Teresa Richards, Teresa Richards  Account Number:  [id number] Admit date:  03/08/2012  Clinical Social Worker:  Peggyann Shoals  Date/time:  03/12/2012 01:49 PM  Clinical Social Work is seeking post-discharge placement for this patient at the following level of care:   SKILLED NURSING   (*CSW will update this form in Epic as items are completed)   03/11/2012  Patient/family provided with [facility] Department of Clinical Social Work's list of facilities offering this level of care within the geographic area requested by the patient (or if unable, by the patient's family).  03/11/2012  Patient/family informed of their freedom to choose among providers that offer the needed level of care, that participate in Medicare, Medicaid or managed care program needed by the patient, have an available bed and are willing to accept the patient.  03/11/2012  Patient/family informed of [facility]' ownership interest in [facility], as well as of the fact that they are under no obligation to receive care at this facility.  PASARR submitted to EDS on 03/12/2012 PASARR number received from [employer] on 03/12/2012  FL2 transmitted to all facilities in geographic area requested by pt/family on  03/12/2012 FL2 transmitted to all facilities within larger geographic area on   Patient informed that his/her managed care company has contracts with or will negotiate with  certain facilities, including the following:     Patient/family informed of bed offers received:  03/14/2012 Patient chooses bed at [facility] Physician recommends and patient chooses bed at SNF  Patient to be transferred to [facility] and [place] [facility] on 03/17/2012 Patient to be transferred to facility by [facility]  The following physician request were entered in Epic:   Additional Comments:  Dede Query, MSW, Theresia Majors [phone]

## 2012-03-12 NOTE — Progress Notes (Signed)
Speech Language Pathology Treatment Patient Details Name: Teresa Richards MRN: [id number] DOB: 27-May-1918 Today's Date: 03/12/2012 Time: 1145-1200 SLP Time Calculation (min): 15 min  Assessment / Plan / Recommendation SLP Plan  Goals updated       SLP Goals  SLP Goals SLP Goal #2 - Progress: Met  General Temperature Spikes Noted: No Respiratory Status: Room air Behavior/Cognition: Alert;Cooperative;Pleasant mood Oral Cavity - Dentition: Adequate natural dentition Patient Positioning: Upright in chair  Oral Cavity - Oral Hygiene Does patient have any of the following "at risk" factors?: None of the above Brush patient's teeth BID with toothbrush (using toothpaste with fluoride): Yes   Treatment Treatment focused on: Cognition Skilled Treatment: Focus of treatment to improve cognitive skills to increase safety in current environment.   Improvement in sustained attention this treatment as no cues required for patient to complete target  ADL.  Intellectual awareness present for need for assist to toliet and transfer but patient continues to require cues to utilize call light appropriately. Patient has met current cognitive goals for ST in acute care but  recommend continued ST treatment at next level of care to addresss cognitive deficits to decrease fall risk.      GO    Moreen Fowler MS, CCC-SLP [phone] [facility] 03/12/2012, 3:08 PM

## 2012-03-12 NOTE — Progress Notes (Signed)
[facility] Progress Note [place] TEAM 1 - Stepdown/ICU TEAM   Teresa Richards ZOX:[id number] DOB: 11-26-1918 DOA: 03/08/2012 PCP: Kimber Relic, MD  Brief narrative: 79 year old female with history of early dementia, diabetes, hypertension, recurrent falls, resident of assisted living at [facility] presented to ED after a fall. History was obtained from the patient's daughter on the phone, [name] [phone]) who stated that patient was having falls recently. She was in independent living facility till November when she had a fall and was upgraded to assisted living. The day of admission the patient had mechanical fall twice in the morning. The facility reported to the daughter that patient was somewhat leaning towards the left and lost her balance and had slurred speech. When the patient's daughter arrived in the ER, patient did not recognize her daughter.  ED workup showed hypokalemia with potassium of 3.1, dehydration (positive ketones in the urine). CT head showed left frontotemporal/anterior left parafalcine subdural hepatoma 5mm, 2mm left to right midline shift. [name] blood pressure was elevated at 218/93, she was placed on a Cardene drip which was weaned off. PCCM was consulted and recommended step-down admission.  Assessment/Plan:  Subdural hematoma -evaluated by Neurology - surgical intervention was not indicated  -Repeat CT head 03/09/12 showed stability  Fall at nursing home -PT/OT evaluation -needs SNF for rehab stay S/W aware  Malignant hypertension -no evidence of CVA on brain imaging  -now off IV gtt -resume home meds of lisinopril at 40 mg [up from prior dose of 10 mg daily here]-keep metoprolol at lower dose o 12.5 daily [down from 25 daily]  Encephalopathy - baseline dementia + post-concussion syndrome -Alert but confused today - passed swallow eval-needs regular diet c thin liquids  Diabetes mellitus - diet controlled  -Does not appear to be on  medication as outpatient per med rec -on a sensitive sliding scale for now -Would not aggressively control the same  Hypokalemia -Replacing c Kdur 20 bid - follow trend   Laceration/contusion- left eye/face -monitor for infection  Mild renal insufficiency-d/c IVF 2.19.14.  Rpt Bmet am  Dementia -Stable without agitation  Code Status: DO NOT RESUSCITATE Family Communication: called daughter on ohone-no response Disposition Plan:   Consultants: Neurosurgery Neurology  Antibiotics: None  DVT prophylaxis: SCDs  HPI/Subjective: Patient is alert and less confused Orients to city, and state well  Objective: Blood pressure 152/72, pulse 86, temperature 97.4 F (36.3 C), temperature source Oral, resp. rate 20, height 5' (1.524 m), weight 59.7 kg (131 lb 9.8 oz), SpO2 97.00%.  Intake/Output Summary (Last 24 hours) at 03/12/12 1612 Last data filed at 03/12/12 1300  Gross per 24 hour  Intake   1210 ml  Output    300 ml  Net    910 ml   Exam: General: No acute respiratory distress-hematoma covers most of the lateral L face and seems to be clearing a little-3-4 sutures in peri-orbital region Lungs: Clear to auscultation bilaterally without wheezes or crackles ENT: Unremarkable except for small laceration with contusion left periorbital region Cardiovascular: Regular rate and rhythm without murmur gallop or rub normal S1 and S2, no peripheral edema or JVD Abdomen: Nontender, nondistended, soft, bowel sounds positive, no rebound  Data Reviewed: Basic Metabolic Panel:  Recent Labs Lab 03/08/12 1420 03/09/12 0548 03/10/12 0515 03/11/12 0555  NA 131* 135 139 141  K 3.1* 2.9* 3.8 3.6  CL 94* 96 104 107  CO2 25 23 25 25   GLUCOSE 178* 289* 146* 140*  BUN 12  16 37* 24*  CREATININE 0.50 0.68 0.98 0.66  CALCIUM 10.3 9.7 9.5 8.9   Liver Function Tests:  Recent Labs Lab 03/08/12 1420  AST 21  ALT 14  ALKPHOS 83  BILITOT 0.5  PROT 7.5  ALBUMIN 4.2   CBC:  Recent  Labs Lab 03/08/12 1420 03/09/12 0548 03/10/12 0515 03/11/12 0555  WBC 5.6 7.5 7.4 5.8  NEUTROABS 4.7  --   --   --   HGB 14.3 14.0 12.2 11.9*  HCT 41.4 40.3 36.7 35.0*  MCV 83.3 81.6 84.2 84.5  PLT 179 216 196 168   CBG:  Recent Labs Lab 03/11/12 1057 03/11/12 1638 03/11/12 2151 03/12/12 0649 03/12/12 1120  GLUCAP 167* 119* 144* 142* 164*    Recent Results (from the past 240 hour(s))  URINE CULTURE     Status: None   Collection Time    03/08/12  3:31 PM      Result Value Range Status   Specimen Description URINE, CATHETERIZED   Final   Special Requests ADDED ON 03/08/12 AT 1821   Final   Culture  Setup Time 03/09/2012 01:46   Final   Colony Count NO GROWTH   Final   Culture NO GROWTH   Final   Report Status 03/10/2012 FINAL   Final  MRSA PCR SCREENING     Status: None   Collection Time    03/08/12 10:32 PM      Result Value Range Status   MRSA by PCR NEGATIVE  NEGATIVE Final   Comment:            The GeneXpert MRSA Assay (FDA     approved for NASAL specimens     only), is one component of a     comprehensive MRSA colonization     surveillance program. It is not     intended to diagnose MRSA     infection nor to guide or     monitor treatment for     MRSA infections.       Rhetta Mura Pager: [phone]  If 7PM-7AM, please contact night-coverage [email or web address] Password [email or web address] 03/12/2012, 4:12 PM   LOS: 4 days

## 2012-03-12 NOTE — Progress Notes (Signed)
Speech Language Pathology Dysphagia Treatment Patient Details Name: Teresa Richards MRN: [id number] DOB: [birth year] Today's Date: 03/12/2012 Time: 1200-1230 SLP Time Calculation (min): 30 min  Assessment / Plan / Recommendation Clinical Impression  Focus of treatment facilatation of oral prep and oral stage for possible diet upgrade to regular from mechanical soft consistency. Observed directly with trials of regular consistency with adequate mastication noted. Minimal oral residuals noted anterior but cleared with sip of liquid.  Recommend to upgrade to regular consistency with intermittent supervision to provide necessary cues.  ST to follow briefly in acute care setting for diet tolerance.      Diet Recommendation  Initiate / Change Diet: Regular;Thin liquid    SLP Plan Continue with current plan of care      Swallowing Goals  SLP Swallowing Goals Swallow Study Goal #1 - Progress: Progressing toward goal Swallow Study Goal #2 - Progress: Progressing toward goal  General Respiratory Status: Room air Behavior/Cognition: Alert;Cooperative;Pleasant mood Oral Cavity - Dentition: Adequate natural dentition Patient Positioning: Upright in chair  Oral Cavity - Oral Hygiene Does patient have any of the following "at risk" factors?: None of the above Brush patient's teeth BID with toothbrush (using toothpaste with fluoride): Yes   Dysphagia Treatment Treatment focused on: Skilled observation of diet tolerance;Upgraded PO texture trials;Facilitation of oral phase;Facilitation of oral preparatory phase Treatment Methods/Modalities: Skilled observation;Differential diagnosis Patient observed directly with PO's: Yes Type of PO's observed: Regular Feeding: Able to feed self Oral Phase Signs & Symptoms: Prolonged oral phase Type of cueing: Verbal Amount of cueing: Minimal   GO    Moreen Fowler MS, CCC-SLP [phone] [facility] 03/12/2012, 2:58 PM

## 2012-03-13 LAB — GLUCOSE, CAPILLARY
Glucose-Capillary: 177 mg/dL — ABNORMAL HIGH (ref 70–99)
Glucose-Capillary: 146 mg/dL — ABNORMAL HIGH (ref 70–99)

## 2012-03-13 LAB — BASIC METABOLIC PANEL
BUN: 9 mg/dL (ref 6–23)
CO2: 27 mEq/L (ref 19–32)
Chloride: 101 mEq/L (ref 96–112)
Creatinine, Ser: 0.55 mg/dL (ref 0.50–1.10)

## 2012-03-13 MED ORDER — SENNOSIDES-DOCUSATE SODIUM 8.6-50 MG PO TABS
1.0000 | ORAL_TABLET | Freq: Two times a day (BID) | ORAL | Status: DC
  Administered 2012-03-13 – 2012-03-17 (×5): 1 via ORAL
  Filled 2012-03-13 (×3): qty 1

## 2012-03-13 MED ORDER — POTASSIUM CHLORIDE CRYS ER 20 MEQ PO TBCR
40.0000 meq | EXTENDED_RELEASE_TABLET | Freq: Two times a day (BID) | ORAL | Status: DC
  Administered 2012-03-13 – 2012-03-17 (×9): 40 meq via ORAL
  Filled 2012-03-13 (×10): qty 2

## 2012-03-13 NOTE — Progress Notes (Signed)
Physical Therapy Treatment Patient Details Name: Teresa Richards MRN: [id number] DOB: 1918-05-02 Today's Date: 03/13/2012 Time: 1308-6578 PT Time Calculation (min): 23 min  PT Assessment / Plan / Recommendation Comments on Treatment Session  Pt. with mobility and safety imrovements noted today.      Follow Up Recommendations  SNF;Supervision/Assistance - 24 hour     Does the patient have the potential to tolerate intense rehabilitation     Barriers to Discharge        Equipment Recommendations  None recommended by PT    Recommendations for Other Services    Frequency Min 3X/week   Plan Discharge plan remains appropriate;Frequency remains appropriate    Precautions / Restrictions Precautions Precautions: Fall Precaution Comments: cognitive deficits Restrictions Weight Bearing Restrictions: No   Pertinent Vitals/Pain Pt. Denies pain, in NAD     Mobility  Bed Mobility Bed Mobility: Not assessed Transfers Transfers: Sit to Stand;Stand to Sit Sit to Stand: 4: Min guard;With upper extremity assist;From chair/3-in-1 Stand to Sit: 4: Min guard;To chair/3-in-1;With upper extremity assist Details for Transfer Assistance: VCs for safe hand placement Ambulation/Gait Ambulation/Gait Assistance: 4: Min guard Ambulation Distance (Feet): 200 Feet Assistive device: Rolling walker Ambulation/Gait Assistance Details: Frequent reminders of hand placement on Rw as she tends to hold forward of the handpieces.  Cues for postural correction and for correcting to midline as she tends to veer toward left.  sshe says this is her baseline (veering) Gait Pattern: Step-through pattern;Decreased step length - right;Decreased step length - left;Trunk flexed;Narrow base of support Gait velocity: slow Stairs: No    Exercises     PT Diagnosis:    PT Problem List:   PT Treatment Interventions:     PT Goals Acute Rehab PT Goals PT Goal Formulation: With patient/family Time For Goal Achievement:  03/17/12 Potential to Achieve Goals: Good Pt will go Supine/Side to Sit: with HOB 0 degrees;with supervision Pt will go Sit to Supine/Side: with supervision;with HOB 0 degrees Pt will go Sit to Stand: with supervision;with upper extremity assist PT Goal: Sit to Stand - Progress: Progressing toward goal Pt will Ambulate: >150 feet;with supervision;with rolling walker PT Goal: Ambulate - Progress: Progressing toward goal  Visit Information  Last PT Received On: 03/13/12 Assistance Needed: +1    Subjective Data  Subjective: "I prefer to sit up in the chair"   Cognition  Cognition Overall Cognitive Status: Impaired Area of Impairment: Attention;Memory;Safety/judgement;Awareness of errors;Awareness of deficits;Problem solving;Executive functioning Arousal/Alertness: Awake/alert Orientation Level: Oriented X4 / Intact Behavior During Session: [facility] for tasks performed Current Attention Level: Selective Memory: Decreased recall of precautions Safety/Judgement: Decreased awareness of safety precautions    Balance     End of Session PT - End of Session Equipment Utilized During Treatment: Gait belt Activity Tolerance: Patient tolerated treatment well Patient left: in chair;with call bell/phone within reach;with chair alarm set Nurse Communication: Mobility status;Other (comment)   GP     Teresa Richards 03/13/2012, 2:51 PM Teresa Richards PT Acute Rehab Services [phone] [facility] [phone]

## 2012-03-13 NOTE — Progress Notes (Signed)
[facility] Progress Note [place] TEAM 1 - Stepdown/ICU TEAM   Teresa Richards UJW:[id number] DOB: 15-May-1918 DOA: 03/08/2012 PCP: Kimber Relic, MD  Brief narrative: 79 year old female with history of early dementia, diabetes, hypertension, recurrent falls, resident of assisted living at [facility] presented to ED after a fall. History was obtained from the patient's daughter on the phone, [name] [phone]) who stated that patient was having falls recently. She was in independent living facility till November when she had a fall and was upgraded to assisted living. The day of admission the patient had mechanical fall twice in the morning. The facility reported to the daughter that patient was somewhat leaning towards the left and lost her balance and had slurred speech. When the patient's daughter arrived in the ER, patient did not recognize her daughter.  ED workup showed hypokalemia with potassium of 3.1, dehydration (positive ketones in the urine). CT head showed left frontotemporal/anterior left parafalcine subdural hepatoma 5mm, 2mm left to right midline shift. [name] blood pressure was elevated at 218/93, she was placed on a Cardene drip which was weaned off. PCCM was consulted and recommended step-down admission.  Assessment/Plan:  Subdural hematoma -evaluated by Neurology & NS - surgical intervention was not indicated  -Repeat CT head 03/09/12 showed stability  Fall at nursing home -PT/OT evaluation -needs SNF for rehab stay -S/W aware  Malignant hypertension -no evidence of CVA on brain imaging  -now off IV gtt Cardene -resume home meds of lisinopril at 40 mg [up from prior dose of 10 mg daily here]-keep metoprolol at lower dose o 12.5 daily [down from 25 daily]  Encephalopathy - baseline dementia + post-concussion syndrome -Alert with slight confusion - passed swallow eval-needs regular diet c thin liquids  Diabetes mellitus - diet controlled  -Does not  appear to be on medication as outpatient per med rec, CBG 146-210 -on a sensitive sliding scale for now -Would not aggressively control the same  Hypokalemia -Replacing c Kdur 20 bid>> increased to 40 bid 2.20 - follow trend   Laceration/contusion- left eye/face -monitor for infection  Mild renal insufficiency-d/c IVF 2.19.14.  Rpt Bmet was relatively stable  Dementia -Stable without agitation  Code Status: DO NOT RESUSCITATE Family Communication: No one at bedside Disposition Plan: Need SNF-Search in process   Consultants: Neurosurgery Neurology  Antibiotics: None  DVT prophylaxis: SCDs  HPI/Subjective: Patient is alert and less confused-very HOH No other current issues.  Worried abotu her   Objective: Blood pressure 139/64, pulse 77, temperature 98.1 F (36.7 C), temperature source Oral, resp. rate 16, height 5' (1.524 m), weight 59.7 kg (131 lb 9.8 oz), SpO2 96.00%.  Intake/Output Summary (Last 24 hours) at 03/13/12 8469 Last data filed at 03/12/12 1300  Gross per 24 hour  Intake    360 ml  Output      0 ml  Net    360 ml   Exam: General: No acute respiratory distress-hematoma covers most of the lateral L face and seems to be clearing a little-3-4 sutures in peri-orbital region Lungs: Clear to auscultation bilaterally without wheezes or crackles ENT: Unremarkable except for small laceration with contusion left periorbital region Cardiovascular: Regular rate and rhythm without murmur gallop or rub normal S1 and S2, no peripheral edema or JVD Abdomen: Nontender, nondistended, soft, bowel sounds positive, no rebound  Data Reviewed: Basic Metabolic Panel:  Recent Labs Lab 03/08/12 1420 03/09/12 0548 03/10/12 0515 03/11/12 0555 03/13/12 0540  NA 131* 135 139 141 139  K  3.1* 2.9* 3.8 3.6 3.0*  CL 94* 96 104 107 101  CO2 25 23 25 25 27   GLUCOSE 178* 289* 146* 140* 145*  BUN 12 16 37* 24* 9  CREATININE 0.50 0.68 0.98 0.66 0.55  CALCIUM 10.3 9.7 9.5 8.9  9.4   Liver Function Tests:  Recent Labs Lab 03/08/12 1420  AST 21  ALT 14  ALKPHOS 83  BILITOT 0.5  PROT 7.5  ALBUMIN 4.2   CBC:  Recent Labs Lab 03/08/12 1420 03/09/12 0548 03/10/12 0515 03/11/12 0555  WBC 5.6 7.5 7.4 5.8  NEUTROABS 4.7  --   --   --   HGB 14.3 14.0 12.2 11.9*  HCT 41.4 40.3 36.7 35.0*  MCV 83.3 81.6 84.2 84.5  PLT 179 216 196 168   CBG:  Recent Labs Lab 03/12/12 0649 03/12/12 1120 03/12/12 1746 03/12/12 2138 03/13/12 0652  GLUCAP 142* 164* 201* 177* 146*    Recent Results (from the past 240 hour(s))  URINE CULTURE     Status: None   Collection Time    03/08/12  3:31 PM      Result Value Range Status   Specimen Description URINE, CATHETERIZED   Final   Special Requests ADDED ON 03/08/12 AT 1821   Final   Culture  Setup Time 03/09/2012 01:46   Final   Colony Count NO GROWTH   Final   Culture NO GROWTH   Final   Report Status 03/10/2012 FINAL   Final  MRSA PCR SCREENING     Status: None   Collection Time    03/08/12 10:32 PM      Result Value Range Status   MRSA by PCR NEGATIVE  NEGATIVE Final   Comment:            The GeneXpert MRSA Assay (FDA     approved for NASAL specimens     only), is one component of a     comprehensive MRSA colonization     surveillance program. It is not     intended to diagnose MRSA     infection nor to guide or     monitor treatment for     MRSA infections.       Rhetta Mura Pager: [phone]  If 7PM-7AM, please contact night-coverage [email or web address] Password [email or web address] 03/13/2012, 8:28 AM   LOS: 5 days

## 2012-03-13 NOTE — Progress Notes (Addendum)
Speech Language Pathology Dysphagia Treatment Patient Details Name: Teresa Richards MRN: [id number] DOB: Mar 29, 1918 Today's Date: 03/13/2012 Time: 1000-1030 SLP Time Calculation (min): 30 min  Assessment / Plan / Recommendation Clinical Impression  Diagnostic treatment completed focusing on diet tolerance of recent upgrade to regular consistency.  Observed directly with regular solid with adequate oral prep and oral stage.  No cues required to alternate bites with sips to clear minimal residuals anterior.  RN made aware of recent upgrade with recommendations of intermittent supervision to assist as necessary.    ST to sign off as patient has met all goals.        Diet Recommendation  Continue with Current Diet: Regular;Thin liquid    SLP Plan Discharge SLP treatment due to (comment) (met all goals. )      Swallowing Goals  SLP Swallowing Goals Swallow Study Goal #1 - Progress: Met Swallow Study Goal #2 - Progress: Met  General Temperature Spikes Noted: No Respiratory Status: Room air Behavior/Cognition: Alert;Cooperative Oral Cavity - Dentition: Adequate natural dentition Patient Positioning: Upright in chair  Oral Cavity - Oral Hygiene Does patient have any of the following "at risk" factors?: None of the above Brush patient's teeth BID with toothbrush (using toothpaste with fluoride): Yes   Dysphagia Treatment Treatment focused on: Skilled observation of diet tolerance;Facilitation of oral phase;Facilitation of oral preparatory phase;Patient/family/caregiver education Treatment Methods/Modalities: Skilled observation;Differential diagnosis Patient observed directly with PO's: Yes Type of PO's observed: Regular Feeding: Able to feed self Liquids provided via: Cup;Straw Oral Phase Signs & Symptoms: Prolonged oral phase   GO    Moreen Fowler MS, CCC-SLP [phone] [facility] 03/13/2012, 11:55 AM

## 2012-03-14 DIAGNOSIS — E119 Type 2 diabetes mellitus without complications: Secondary | ICD-10-CM

## 2012-03-14 DIAGNOSIS — I10 Essential (primary) hypertension: Secondary | ICD-10-CM

## 2012-03-14 LAB — GLUCOSE, CAPILLARY
Glucose-Capillary: 139 mg/dL — ABNORMAL HIGH (ref 70–99)
Glucose-Capillary: 180 mg/dL — ABNORMAL HIGH (ref 70–99)
Glucose-Capillary: 105 mg/dL — ABNORMAL HIGH (ref 70–99)
Glucose-Capillary: 135 mg/dL — ABNORMAL HIGH (ref 70–99)

## 2012-03-14 NOTE — Progress Notes (Signed)
Occupational Therapy Treatment Patient Details Name: Teresa Richards MRN: [id number] DOB: 1918-03-25 Today's Date: 03/14/2012 Time: 4098-1191 OT Time Calculation (min): 31 min  OT Assessment / Plan / Recommendation Comments on Treatment Session Pt anxious to leave hospital and go to rehab.  Requesting to bathe and dress.  Continues to have decreased safety awareness related to fall risk.    Follow Up Recommendations  SNF    Barriers to Discharge       Equipment Recommendations  None recommended by OT    Recommendations for Other Services    Frequency Min 2X/week   Plan Discharge plan remains appropriate    Precautions / Restrictions Precautions Precautions: Fall Precaution Comments: cognitive deficits Restrictions Weight Bearing Restrictions: No   Pertinent Vitals/Pain No pain    ADL  Grooming: Wash/dry hands;Wash/dry face;Supervision/safety Where Assessed - Grooming: Unsupported sitting;Supported standing Upper Body Bathing: Set up Where Assessed - Upper Body Bathing: Unsupported sitting (at sink) Lower Body Bathing: Moderate assistance Where Assessed - Lower Body Bathing: Supported standing Upper Body Dressing: Set up Where Assessed - Upper Body Dressing: Unsupported sitting Lower Body Dressing: Minimal assistance Where Assessed - Lower Body Dressing: Unsupported sitting;Supported sit to stand Toilet Transfer: Min Pension scheme manager Method: Sit to Barista: Comfort height toilet Toileting - Clothing Manipulation and Hygiene: Supervision/safety Where Assessed - Engineer, mining and Hygiene: Sit to stand from 3-in-1 or toilet Equipment Used: Gait belt;Rolling walker Transfers/Ambulation Related to ADLs: min guard assist with RW ADL Comments: Pt with decreased standing balance impeding ability to perform pericare.    OT Diagnosis:    OT Problem List:   OT Treatment Interventions:     OT Goals ADL Goals Pt Will Perform  Grooming: with supervision;Standing at sink;Unsupported;with cueing (comment type and amount) ADL Goal: Grooming - Progress: Progressing toward goals Pt Will Perform Lower Body Bathing: with supervision;Unsupported;Sit to stand from chair;with cueing (comment type and amount) ADL Goal: Lower Body Bathing - Progress: Progressing toward goals Pt Will Transfer to Toilet: with supervision;with DME;Ambulation ADL Goal: Toilet Transfer - Progress: Progressing toward goals Pt Will Perform Toileting - Clothing Manipulation: with supervision;Standing ADL Goal: Toileting - Clothing Manipulation - Progress: Progressing toward goals Pt Will Perform Toileting - Hygiene: with supervision;Sit to stand from 3-in-1/toilet ADL Goal: Toileting - Hygiene - Progress: Progressing toward goals Additional ADL Goal #1: Pt will complete 2 step task with min vc for redirection in min distracting environment ADL Goal: Additional Goal #1 - Progress: Met  Visit Information  Last OT Received On: 03/14/12 Assistance Needed: +1    Subjective Data      Prior Functioning       Cognition  Cognition Overall Cognitive Status: Impaired Area of Impairment: Memory;Safety/judgement;Awareness of errors;Awareness of deficits;Problem solving;Executive functioning Arousal/Alertness: Awake/alert Behavior During Session: WFL for tasks performed Safety/Judgement: Decreased awareness of safety precautions Safety/Judgement - Other Comments: stated she is aware to call for assistance    Mobility  Bed Mobility Bed Mobility: Not assessed Transfers Transfers: Sit to Stand;Stand to Sit Sit to Stand: 4: Min guard;With upper extremity assist;From chair/3-in-1;From toilet Stand to Sit: 4: Min guard;With upper extremity assist;To chair/3-in-1;To toilet    Exercises      Balance     End of Session OT - End of Session Activity Tolerance: Patient tolerated treatment well Patient left: in chair;with call bell/phone within reach;with  chair alarm set  GO     Evern Bio 03/14/2012, 1:22 PM [phone]

## 2012-03-14 NOTE — Clinical Social Work Note (Signed)
Clinical Social Work   CSW updated pt's daughter on discharge plan. [facility] does not have a SNF bed at this time and it is unknown when it will be available. CSW expanded search and followed up with bed offers. Pt's daughter has chosen [facility] and room will be available on Monday. CSW will continue to follow to facilitate discharge.  Dede Query, MSW, Theresia Majors [phone]

## 2012-03-14 NOTE — Progress Notes (Signed)
[facility] Progress Note    Teresa Richards ZOX:[id number] DOB: [birth year] DOA: 03/08/2012 PCP: Kimber Relic, MD  Brief narrative: 79 year old female with history of early dementia, diabetes, hypertension, recurrent falls, resident of assisted living at [facility] presented to ED after a fall. History was obtained from the patient's daughter on the phone, [name] [phone]) who stated that patient was having falls recently. She was in independent living facility till November when she had a fall and was upgraded to assisted living. The day of admission the patient had mechanical fall twice in the morning. The facility reported to the daughter that patient was somewhat leaning towards the left and lost her balance and had slurred speech. When the patient's daughter arrived in the ER, patient did not recognize her daughter.  ED workup showed hypokalemia with potassium of 3.1, dehydration (positive ketones in the urine). CT head showed left frontotemporal/anterior left parafalcine subdural hepatoma 5mm, 2mm left to right midline shift. [name] blood pressure was elevated at 218/93, she was placed on a Cardene drip which was weaned off. PCCM was consulted and recommended step-down admission.  Assessment/Plan:  Subdural hematoma -evaluated by Neurology & NS - surgical intervention was not indicated. -Repeat CT head 03/09/12 showed stability  Fall at nursing home -PT/OT evaluation -needs SNF for rehab stay -Husband stays at [facility] and is on hospice-family requesting this facility -S/W aware  Malignant hypertension -no evidence of CVA on brain imaging  -now off IV gtt Cardene -resume home meds of lisinopril at 40 mg [up from prior dose of 10 mg daily here]-keep metoprolol at lower dose of 12.5 daily [down from 25 daily]  Encephalopathy - baseline dementia + post-concussion syndrome -Alert with slight confusion - passed swallow eval-needs regular diet c thin  liquids  Diabetes mellitus - diet controlled  -Does not appear to be on medication as outpatient per med rec, CBG 146-210 -on a sensitive sliding scale for now -Would not aggressively control the same  Hypokalemia -Replacing c Kdur 20 bid>> increased to 40 bid 2.20 - follow trend -rpt Bmet am   Laceration/contusion- left eye/face -monitor for infection  Mild renal insufficiency-d/c IVF 2.19.14.    Dementia -Stable without agitation  Code Status: DO NOT RESUSCITATE Family Communication: No one at bedside Disposition Plan: Need SNF-Search in process   Consultants: Neurosurgery Neurology  Antibiotics: None  DVT prophylaxis: SCDs  HPI/Subjective: Patient is alert and less confused-tolerating dinner.  No cp/n/v/sob No other current issues.     Objective: Blood pressure 130/68, pulse 94, temperature 98.3 F (36.8 C), temperature source Oral, resp. rate 19, height 5' (1.524 m), weight 59.7 kg (131 lb 9.8 oz), SpO2 97.00%. No intake or output data in the 24 hours ending 03/14/12 1753 Exam: General: No acute respiratory distress-hematoma covers most of the lateral L face and seems to be clearing a little-3-4 sutures in peri-orbital region Lungs: Clear to auscultation bilaterally without wheezes or crackles ENT: Unremarkable except for small laceration with contusion left periorbital region Cardiovascular: Regular rate and rhythm without murmur gallop or rub normal S1 and S2, no peripheral edema or JVD Abdomen: Nontender, nondistended, soft, bowel sounds positive, no rebound  Data Reviewed: Basic Metabolic Panel:  Recent Labs Lab 03/08/12 1420 03/09/12 0548 03/10/12 0515 03/11/12 0555 03/13/12 0540  NA 131* 135 139 141 139  K 3.1* 2.9* 3.8 3.6 3.0*  CL 94* 96 104 107 101  CO2 25 23 25 25 27   GLUCOSE 178* 289* 146* 140* 145*  BUN 12 16 37* 24* 9  CREATININE 0.50 0.68 0.98 0.66 0.55  CALCIUM 10.3 9.7 9.5 8.9 9.4   Liver Function Tests:  Recent Labs Lab  03/08/12 1420  AST 21  ALT 14  ALKPHOS 83  BILITOT 0.5  PROT 7.5  ALBUMIN 4.2   CBC:  Recent Labs Lab 03/08/12 1420 03/09/12 0548 03/10/12 0515 03/11/12 0555  WBC 5.6 7.5 7.4 5.8  NEUTROABS 4.7  --   --   --   HGB 14.3 14.0 12.2 11.9*  HCT 41.4 40.3 36.7 35.0*  MCV 83.3 81.6 84.2 84.5  PLT 179 216 196 168   CBG:  Recent Labs Lab 03/13/12 1630 03/13/12 2047 03/14/12 0636 03/14/12 1107 03/14/12 1639  GLUCAP 116* 132* 139* 180* 105*    Recent Results (from the past 240 hour(s))  URINE CULTURE     Status: None   Collection Time    03/08/12  3:31 PM      Result Value Range Status   Specimen Description URINE, CATHETERIZED   Final   Special Requests ADDED ON 03/08/12 AT 1821   Final   Culture  Setup Time 03/09/2012 01:46   Final   Colony Count NO GROWTH   Final   Culture NO GROWTH   Final   Report Status 03/10/2012 FINAL   Final  MRSA PCR SCREENING     Status: None   Collection Time    03/08/12 10:32 PM      Result Value Range Status   MRSA by PCR NEGATIVE  NEGATIVE Final   Comment:            The GeneXpert MRSA Assay (FDA     approved for NASAL specimens     only), is one component of a     comprehensive MRSA colonization     surveillance program. It is not     intended to diagnose MRSA     infection nor to guide or     monitor treatment for     MRSA infections.       Rhetta Mura Pager: [phone]  If 7PM-7AM, please contact night-coverage [email or web address] Password [facility] 03/14/2012, 5:53 PM   LOS: 6 days

## 2012-03-15 LAB — BASIC METABOLIC PANEL
BUN: 10 mg/dL (ref 6–23)
Chloride: 103 mEq/L (ref 96–112)
GFR calc Af Amer: 90 mL/min — ABNORMAL LOW (ref >90)
GFR calc non Af Amer: 77 mL/min — ABNORMAL LOW (ref >90)
Potassium: 4.2 mEq/L (ref 3.5–5.1)

## 2012-03-15 LAB — GLUCOSE, CAPILLARY: Glucose-Capillary: 135 mg/dL — ABNORMAL HIGH (ref 70–99)

## 2012-03-15 NOTE — Progress Notes (Signed)
[facility] Progress Note    KENT BRAUNSCHWEIG WGN:[id number] DOB: 1918-11-07 DOA: 03/08/2012 PCP: Kimber Relic, MD  Brief narrative: 79 year old female with history of early dementia, diabetes, hypertension, recurrent falls, resident of assisted living at [facility] presented to ED after a fall. History was obtained from the patient's daughter on the phone, [name] [phone]) who stated that patient was having falls recently. She was in independent living facility till November when she had a fall and was upgraded to assisted living. The day of admission the patient had mechanical fall twice in the morning. The facility reported to the daughter that patient was somewhat leaning towards the left and lost her balance and had slurred speech. When the patient's daughter arrived in the ER, patient did not recognize her daughter.  ED workup showed hypokalemia with potassium of 3.1, dehydration (positive ketones in the urine). CT head showed left frontotemporal/anterior left parafalcine subdural hepatoma 5mm, 2mm left to right midline shift. [name] blood pressure was elevated at 218/93, she was placed on a Cardene drip which was weaned off. PCCM was consulted and recommended step-down admission.  Assessment/Plan:  Subdural hematoma -evaluated by Neurology & NS - surgical intervention was not indicated. -Repeat CT head 03/09/12 showed stability  Fall at nursing home -PT/OT evaluation -needs SNF for rehab stay -S/W aware  Malignant hypertension -no evidence of CVA on brain imaging  -now off IV gtt Cardene -resume home meds of lisinopril at 40 mg [up from prior dose of 10 mg daily here]-keep metoprolol at lower dose of 12.5 daily [down from 25 daily]  Encephalopathy - baseline dementia + post-concussion syndrome -Alert with slight confusion - passed swallow eval-needs regular diet c thin liquids  Diabetes mellitus - diet controlled  -Does not appear to be on medication as outpatient  per med rec, CBG 138-186 -on a sensitive sliding scale for now -Would not aggressively control the same  Hypokalemia -Replacing c Kdur 20 bid>> increased to 40 bid 2.20 - follow trend -rpt Bmet am   Laceration/contusion- left eye/face -monitor for infection  Mild renal insufficiency-d/c IVF 2.19.14.    Dementia -Stable without agitation  Code Status: DO NOT RESUSCITATE Family Communication: No one at bedside Disposition Plan: Need SNF-Search in process   Consultants: Neurosurgery Neurology  Antibiotics: None  DVT prophylaxis: SCDs  HPI/Subjective: Patient is alert-no other concerns-nursing reports one loosse stool this am but no other c/o   Objective: Blood pressure 136/77, pulse 88, temperature 98.2 F (36.8 C), temperature source Oral, resp. rate 18, height 5' (1.524 m), weight 59.7 kg (131 lb 9.8 oz), SpO2 98.00%. No intake or output data in the 24 hours ending 03/15/12 1344 Exam: General: No acute respiratory distress-hematoma covers most of the lateral L face and seems to be clearing a little-3-4 sutures in peri-orbital region Lungs: Clear to auscultation bilaterally without wheezes or crackles ENT: Unremarkable except for small laceration with contusion left periorbital region Cardiovascular: Regular rate and rhythm without murmur gallop or rub normal S1 and S2, no peripheral edema or JVD Abdomen: Nontender, nondistended, soft, bowel sounds positive, no rebound  Data Reviewed: Basic Metabolic Panel:  Recent Labs Lab 03/09/12 0548 03/10/12 0515 03/11/12 0555 03/13/12 0540 03/15/12 0555  NA 135 139 141 139 138  K 2.9* 3.8 3.6 3.0* 4.2  CL 96 104 107 101 103  CO2 23 25 25 27 26   GLUCOSE 289* 146* 140* 145* 138*  BUN 16 37* 24* 9 10  CREATININE 0.68 0.98 0.66 0.55 0.60  CALCIUM 9.7 9.5 8.9 9.4 9.4   Liver Function Tests:  Recent Labs Lab 03/08/12 1420  AST 21  ALT 14  ALKPHOS 83  BILITOT 0.5  PROT 7.5  ALBUMIN 4.2   CBC:  Recent  Labs Lab 03/08/12 1420 03/09/12 0548 03/10/12 0515 03/11/12 0555  WBC 5.6 7.5 7.4 5.8  NEUTROABS 4.7  --   --   --   HGB 14.3 14.0 12.2 11.9*  HCT 41.4 40.3 36.7 35.0*  MCV 83.3 81.6 84.2 84.5  PLT 179 216 196 168   CBG:  Recent Labs Lab 03/14/12 1107 03/14/12 1639 03/14/12 2210 03/15/12 0717 03/15/12 1129  GLUCAP 180* 105* 135* 135* 186*    Recent Results (from the past 240 hour(s))  URINE CULTURE     Status: None   Collection Time    03/08/12  3:31 PM      Result Value Range Status   Specimen Description URINE, CATHETERIZED   Final   Special Requests ADDED ON 03/08/12 AT 1821   Final   Culture  Setup Time 03/09/2012 01:46   Final   Colony Count NO GROWTH   Final   Culture NO GROWTH   Final   Report Status 03/10/2012 FINAL   Final  MRSA PCR SCREENING     Status: None   Collection Time    03/08/12 10:32 PM      Result Value Range Status   MRSA by PCR NEGATIVE  NEGATIVE Final   Comment:            The GeneXpert MRSA Assay (FDA     approved for NASAL specimens     only), is one component of a     comprehensive MRSA colonization     surveillance program. It is not     intended to diagnose MRSA     infection nor to guide or     monitor treatment for     MRSA infections.       Rhetta Mura Pager: [phone]  If 7PM-7AM, please contact night-coverage [email or web address] Password [facility] 03/15/2012, 1:44 PM   LOS: 7 days

## 2012-03-16 LAB — GLUCOSE, CAPILLARY
Glucose-Capillary: 129 mg/dL — ABNORMAL HIGH (ref 70–99)
Glucose-Capillary: 147 mg/dL — ABNORMAL HIGH (ref 70–99)
Glucose-Capillary: 138 mg/dL — ABNORMAL HIGH (ref 70–99)

## 2012-03-16 NOTE — Progress Notes (Signed)
[facility] Progress Note    Teresa Richards AVW:[id number] DOB: Feb 12, 1918 DOA: 03/08/2012 PCP: Kimber Relic, MD  Brief narrative: 79 year old female with history of early dementia, diabetes, hypertension, recurrent falls, resident of assisted living at [facility] presented to ED after a fall. History was obtained from the patient's daughter on the phone, [name] [phone]) who stated that patient was having falls recently. She was in independent living facility till November when she had a fall and was upgraded to assisted living. The day of admission the patient had mechanical fall twice in the morning. The facility reported to the daughter that patient was somewhat leaning towards the left and lost her balance and had slurred speech. When the patient's daughter arrived in the ER, patient did not recognize her daughter.  ED workup showed hypokalemia with potassium of 3.1, dehydration (positive ketones in the urine). CT head showed left frontotemporal/anterior left parafalcine subdural hepatoma 5mm, 2mm left to right midline shift. [name] blood pressure was elevated at 218/93, she was placed on a Cardene drip which was weaned off. PCCM was consulted and recommended step-down admission.  Assessment/Plan:  Subdural hematoma -evaluated by Neurology & NS - surgical intervention was not indicated. -Repeat CT head 03/09/12 showed stability  Fall at nursing home -PT/OT evaluation -needs SNF for rehab stay -S/W aware -Facial sutures removed 2.23.14  Malignant hypertension -no evidence of CVA on brain imaging  -now off IV gtt Cardene -resume home meds of lisinopril at 40 mg [up from prior dose of 10 mg daily here]-keep metoprolol at lower dose of 12.5 daily [down from 25 daily]  Encephalopathy - baseline dementia + post-concussion syndrome -Alert with slight confusion - passed swallow eval-needs regular diet c thin liquids  Diabetes mellitus - diet controlled  -Does not appear  to be on medication as outpatient per med rec, CBG 138-186 -on a sensitive sliding scale for now -Would not aggressively control the same  Hypokalemia -Replacing c Kdur 20 bid>> increased to 40 bid 2.20 - follow trend -rpt Bmet am   Laceration/contusion- left eye/face -monitor for infection  Mild renal insufficiency-d/c IVF 2.19.14.    Dementia -Stable without agitation  Code Status: DO NOT RESUSCITATE Family Communication: No one at bedside Disposition Plan: Need SNF-Search in process   Consultants: Neurosurgery Neurology  Antibiotics: None  DVT prophylaxis: SCDs  HPI/Subjective: Patient is alert-had some episodes of diarrhea overnight that have since resolved.  Deneis any n/v/cp sob    Objective: Blood pressure 149/78, pulse 74, temperature 97.9 F (36.6 C), temperature source Oral, resp. rate 18, height 5' (1.524 m), weight 59.7 kg (131 lb 9.8 oz), SpO2 98.00%. No intake or output data in the 24 hours ending 03/16/12 1452 Exam: General: No acute respiratory distress-hematoma covers most of the lateral L face and seems to be clearing a little Lungs: Clear to auscultation bilaterally without wheezes or crackles Cardiovascular: Regular rate and rhythm without murmur gallop or rub normal S1 and S2, no peripheral edema or JVD Abdomen: Nontender, nondistended, soft, bowel sounds positive, no rebound  Data Reviewed: Basic Metabolic Panel:  Recent Labs Lab 03/10/12 0515 03/11/12 0555 03/13/12 0540 03/15/12 0555  NA 139 141 139 138  K 3.8 3.6 3.0* 4.2  CL 104 107 101 103  CO2 25 25 27 26   GLUCOSE 146* 140* 145* 138*  BUN 37* 24* 9 10  CREATININE 0.98 0.66 0.55 0.60  CALCIUM 9.5 8.9 9.4 9.4   Liver Function Tests: No results found for this basename: AST,  ALT, ALKPHOS, BILITOT, PROT, ALBUMIN,  in the last 168 hours CBC:  Recent Labs Lab 03/10/12 0515 03/11/12 0555  WBC 7.4 5.8  HGB 12.2 11.9*  HCT 36.7 35.0*  MCV 84.2 84.5  PLT 196 168    CBG:  Recent Labs Lab 03/15/12 1129 03/15/12 1635 03/15/12 2310 03/16/12 0629 03/16/12 1133  GLUCAP 186* 121* 139* 129* 129*    Recent Results (from the past 240 hour(s))  URINE CULTURE     Status: None   Collection Time    03/08/12  3:31 PM      Result Value Range Status   Specimen Description URINE, CATHETERIZED   Final   Special Requests ADDED ON 03/08/12 AT 1821   Final   Culture  Setup Time 03/09/2012 01:46   Final   Colony Count NO GROWTH   Final   Culture NO GROWTH   Final   Report Status 03/10/2012 FINAL   Final  MRSA PCR SCREENING     Status: None   Collection Time    03/08/12 10:32 PM      Result Value Range Status   MRSA by PCR NEGATIVE  NEGATIVE Final   Comment:            The GeneXpert MRSA Assay (FDA     approved for NASAL specimens     only), is one component of a     comprehensive MRSA colonization     surveillance program. It is not     intended to diagnose MRSA     infection nor to guide or     monitor treatment for     MRSA infections.       Rhetta Mura Pager: [phone]  If 7PM-7AM, please contact night-coverage [email or web address] Password [facility] 03/16/2012, 2:52 PM   LOS: 8 days

## 2012-03-17 LAB — GLUCOSE, CAPILLARY

## 2012-03-17 NOTE — Clinical Social Work Note (Signed)
Clinical Social Work   Pt is ready for discharge to [facility]. Blue Medicare has been obtained. Facility has received discharge summary and is ready to admit pt. Pt and family are agreeable to discharge plan. [facility] will provide transportation to facility. CSW is signing off as no further needs identified.   Dede Query, MSW, Theresia Majors [phone]

## 2012-03-17 NOTE — Discharge Summary (Signed)
Physician Discharge Summary  Teresa Richards:[id number] DOB: 1918-03-21 DOA: 03/08/2012  PCP: Kimber Relic, MD  Admit date: 03/08/2012 Discharge date: 03/17/2012  Time spent: 25 minutes  Recommendations for Outpatient Follow-up:  1. Recommend skilled [facility] and 24 hour supervision 2. Recommend bmet, cbc in 3-5 days-might need supplemental potassium 3. Have d/c ASA on this admission-weightthe risks and benefits of restarting   Discharge Diagnoses:  Principal Problem:   Subdural hematoma Active Problems:   Fall at nursing home   Malignant hypertension   Diabetes mellitus   Laceration- left eye/face   Dementia   Discharge Condition: fair  Diet recommendation: Regular consistency Heart healthy diet  Filed Weights   03/08/12 2205  Weight: 59.7 kg (131 lb 9.8 oz)    History of present illness:  79 year old female with history of early dementia, diabetes, hypertension, recurrent falls, resident of assisted living at [facility] presented to ED after a fall. History was obtained from the patient's daughter on the phone, [name] [phone]) who stated that patient was having falls recently. She was in independent living facility till November when she had a fall and was upgraded to assisted living. The day of admission the patient had mechanical fall twice in the morning. The facility reported to the daughter that patient was somewhat leaning towards the left and lost her balance and had slurred speech. When the patient's daughter arrived in the ER, patient did not recognize her daughter.  ED workup showed hypokalemia with potassium of 3.1, dehydration (positive ketones in the urine). CT head showed left frontotemporal/anterior left parafalcine subdural hepatoma 5mm, 2mm left to right midline shift. [name] blood pressure was elevated at 218/93, she was placed on a Cardene drip which was weaned off. PCCM was consulted and recommended step-down admission. She was ultimately  transitioned to a regular floor and did very well PT/OT/SLP consulted and thought she would benefit from skilled care placement   Hospital Course:  Subdural hematoma  -evaluated by Neurology & NS - surgical intervention was not indicated.  -Repeat CT head 03/09/12 showed stability  Fall at nursing home  -PT/OT evaluation -needs SNF for rehab stay  -S/W aware  -Facial sutures removed 2.23.14  Malignant hypertension  -no evidence of CVA on brain imaging  -now off IV gtt Cardene  -resume home meds of lisinopril at 40 mg [up from prior dose of 10 mg daily here]-kept metoprolol at lower dose of 12.5 daily [down from 25 daily]  Encephalopathy - baseline dementia + post-concussion syndrome  -Alert with slight confusion - passed swallow eval-needs regular diet c thin liquids  Diabetes mellitus - diet controlled  -Does not appear to be on medication as outpatient per med rec, CBG 129-145 -on a sensitive sliding scale insulin coverage during Hospital stay -Would not aggressively control the same  Hypokalemia  -Replacing c Kdur 20 bid>> increased to 40 bid 2.20 - follow trend  -discontinued on discharge -please recheck as outpatient Laceration/contusion- left eye/face  -monitor for infection Sutures removed 2.23  Mild renal insufficiency -d/c IVF 2.19.14.  Dementia  -Stable without agitation   Code Status: DO NOT RESUSCITATE   Family Communication: No one at bedside   Disposition Plan: Need SNF-Search in process   Consultants:  Neurosurgery  Neurology  Antibiotics:   None   DVT prophylaxis:  SCDs   Discharge Exam: Filed Vitals:   03/16/12 1811 03/16/12 2145 03/16/12 2240 03/17/12 0548  BP: 155/72 167/88 133/69 147/68  Pulse: 82 86  88  Temp:  98 F (36.7 C) 97.7 F (36.5 C)  98.7 F (37.1 C)  TempSrc:  Oral  Oral  Resp: 18 18    Height:      Weight:      SpO2: 98% 91%  99%   Alert pleasant, eating breakfast.  No complaints General: nad Cardiovascular: s1 s2 no  m/r/g Respiratory: clear  Discharge Instructions  Discharge Orders   Future Orders Complete By Expires     Call MD for:  difficulty breathing, headache or visual disturbances  As directed     Call MD for:  hives  As directed     Call MD for:  persistant nausea and vomiting  As directed     Call MD for:  severe uncontrolled pain  As directed     Diet - low sodium heart healthy  As directed     Increase activity slowly  As directed         Medication List    STOP taking these medications       aspirin 81 MG chewable tablet     HYDROcodone-acetaminophen 5-325 MG per tablet  Commonly known as:  NORCO/VICODIN      TAKE these medications       CERTAVITE/ANTIOXIDANTS PO  Take 1 tablet by mouth daily.     docusate 50 MG/5ML liquid  Commonly known as:  COLACE  Take by mouth See admin instructions. INSTILL 3-5 DROPS INTO EAR AT BEDTIME ON SATURDAY AND SUNDAY     DULoxetine 30 MG capsule  Commonly known as:  CYMBALTA  Take 30 mg by mouth daily.     ibuprofen 200 MG tablet  Commonly known as:  ADVIL,MOTRIN  Take 200 mg by mouth every 6 (six) hours as needed. Pain     lisinopril 40 MG tablet  Commonly known as:  PRINIVIL,ZESTRIL  Take 40 mg by mouth daily.     metoprolol succinate 25 MG 24 hr tablet  Commonly known as:  TOPROL-XL  Take 25 mg by mouth daily.          The results of significant diagnostics from this hospitalization (including imaging, microbiology, ancillary and laboratory) are listed below for reference.    Significant Diagnostic Studies: Ct Head Wo Contrast  03/09/2012  *RADIOLOGY REPORT*  Clinical Data: Fall with intracranial hemorrhage - follow-up left subdural hematoma.  CT HEAD WITHOUT CONTRAST  Technique:  Contiguous axial images were obtained from the base of the skull through the vertex without contrast.  Comparison: 03/03/2012 CT and MRI.  Findings: Motion artifact decreases sensitivity.  A left frontotemporal and interhemispheric subdural  hematoma is unchanged with 5 mm in maximal diameter. Atrophy and chronic small vessel white matter ischemic changes are again noted. No significant midline shift is identified on today's exam.  There is no evidence of new hemorrhage, acute infarction or hydrocephalus.  No acute bony abnormalities are identified.  IMPRESSION: Unchanged left frontotemporal/interhemispheric subdural hematoma with maximal diameter 5 mm.  No new abnormalities or changes.   Original Report Authenticated By: Harmon Pier, M.D.    Ct Head Wo Contrast  03/08/2012  *RADIOLOGY REPORT*  Clinical Data:  79 year old female with head, face and neck injury following fall.  Headache, neck pain and facial pain and swelling. Altered mental status and confusion.  CT HEAD WITHOUT CONTRAST CT MAXILLOFACIAL WITHOUT CONTRAST CT CERVICAL SPINE WITHOUT CONTRAST  Technique:  Multidetector CT imaging of the head, cervical spine, and maxillofacial structures were performed using the standard protocol without intravenous contrast. Multiplanar  CT image reconstructions of the cervical spine and maxillofacial structures were also generated.  Comparison:  01/01/2012 and 08/08/2007 CTs  CT HEAD  Findings: A left frontotemporal/anterior left parafalcine subdural hematoma is identified measuring 5 mm in greatest diameter. 2 mm left to right midline shift is noted.  Chronic small vessel white matter ischemic changes are noted with heavy vascular calcifications.  There is no evidence of acute infarction, hydrocephalus or mass lesion. No acute or suspicious bony abnormalities are noted.  IMPRESSION: Left frontotemporal/anterior left parafalcine subdural hematoma measuring 5 mm in greatest diameter with 2 mm left to right midline shift.  Chronic small vessel white matter ischemic changes.  CT MAXILLOFACIAL  Findings:  Left facial soft tissue swelling is identified. There is no evidence of acute fracture, subluxation or dislocation. The paranasal sinuses, mastoid air cells  and middle/. The orbits and globes are unremarkable. No focal bony lesions are present.  IMPRESSION: Left facial soft tissue swelling without fracture.  CT CERVICAL SPINE  Findings:   Motion artifact degrades images at the C7-T1 articulation.  There is no evidence of acute fracture or subluxation. No prevertebral soft tissue swelling is noted. Moderate degenerative disc disease and spondylosis from C4-T1 noted. Mild to moderate facet arthropathy throughout the cervical spine identified. No soft tissue abnormalities are identified.  IMPRESSION: No static evidence of acute injury to the spine.  Please note that motion artifact slightly limits evaluation.  Multilevel degenerative changes.  Critical Value/emergent results were called by telephone at the time of interpretation on 03/08/2012 at 2:30 p.m. to Dr. Ranae Palms, who verbally acknowledged these results.   Original Report Authenticated By: Harmon Pier, M.D.    Ct Cervical Spine Wo Contrast  03/08/2012  *RADIOLOGY REPORT*  Clinical Data:  80 year old female with head, face and neck injury following fall.  Headache, neck pain and facial pain and swelling. Altered mental status and confusion.  CT HEAD WITHOUT CONTRAST CT MAXILLOFACIAL WITHOUT CONTRAST CT CERVICAL SPINE WITHOUT CONTRAST  Technique:  Multidetector CT imaging of the head, cervical spine, and maxillofacial structures were performed using the standard protocol without intravenous contrast. Multiplanar CT image reconstructions of the cervical spine and maxillofacial structures were also generated.  Comparison:  01/01/2012 and 08/08/2007 CTs  CT HEAD  Findings: A left frontotemporal/anterior left parafalcine subdural hematoma is identified measuring 5 mm in greatest diameter. 2 mm left to right midline shift is noted.  Chronic small vessel white matter ischemic changes are noted with heavy vascular calcifications.  There is no evidence of acute infarction, hydrocephalus or mass lesion. No acute or  suspicious bony abnormalities are noted.  IMPRESSION: Left frontotemporal/anterior left parafalcine subdural hematoma measuring 5 mm in greatest diameter with 2 mm left to right midline shift.  Chronic small vessel white matter ischemic changes.  CT MAXILLOFACIAL  Findings:  Left facial soft tissue swelling is identified. There is no evidence of acute fracture, subluxation or dislocation. The paranasal sinuses, mastoid air cells and middle/. The orbits and globes are unremarkable. No focal bony lesions are present.  IMPRESSION: Left facial soft tissue swelling without fracture.  CT CERVICAL SPINE  Findings:   Motion artifact degrades images at the C7-T1 articulation.  There is no evidence of acute fracture or subluxation. No prevertebral soft tissue swelling is noted. Moderate degenerative disc disease and spondylosis from C4-T1 noted. Mild to moderate facet arthropathy throughout the cervical spine identified. No soft tissue abnormalities are identified.  IMPRESSION: No static evidence of acute injury to the spine.  Please note  that motion artifact slightly limits evaluation.  Multilevel degenerative changes.  Critical Value/emergent results were called by telephone at the time of interpretation on 03/08/2012 at 2:30 p.m. to Dr. Ranae Palms, who verbally acknowledged these results.   Original Report Authenticated By: Harmon Pier, M.D.    Mr Brain Wo Contrast  03/08/2012  *RADIOLOGY REPORT*  Clinical Data: New mental status changes.  Recent fall.  MRI HEAD WITHOUT CONTRAST  Technique:  Multiplanar, multiecho pulse sequences of the brain and surrounding structures were obtained according to standard protocol without intravenous contrast.  Comparison: CT head earlier today.  Findings: The patient had difficulty remaining motionless for the study.  Images are suboptimal.  Small or subtle lesions could be overlooked.  The study is marginally diagnostic.  There is too much motion degradation to exclude a small infarct.  There is no large cortical infarction observed.  There is advanced atrophy with chronic microvascular ischemic change.  There is no parenchymal hemorrhage.  A 5 mm thick acute left frontotemporal and interhemispheric subdural hematoma is noted.  There is only minimal if any midline shift.  When technique differences are considered, the hematoma appears slightly increased in size from priors.  Flow voids are maintained in the carotid and basilar arteries.  IMPRESSION: There is too much motion degradation to exclude a small cerebral infarction.  No large vessel cortical infarct is observed however.  5 mm thick left frontotemporal and interhemispheric subdural hematoma appears acute and slightly increased in size when compared with earlier CT.  Because of the degree of atrophy and chronic microvascular ischemic change, there is no significant mass effect or midline shift.   Original Report Authenticated By: Davonna Belling, M.D.    Ct Maxillofacial Wo Cm  03/08/2012  *RADIOLOGY REPORT*  Clinical Data:  79 year old female with head, face and neck injury following fall.  Headache, neck pain and facial pain and swelling. Altered mental status and confusion.  CT HEAD WITHOUT CONTRAST CT MAXILLOFACIAL WITHOUT CONTRAST CT CERVICAL SPINE WITHOUT CONTRAST  Technique:  Multidetector CT imaging of the head, cervical spine, and maxillofacial structures were performed using the standard protocol without intravenous contrast. Multiplanar CT image reconstructions of the cervical spine and maxillofacial structures were also generated.  Comparison:  01/01/2012 and 08/08/2007 CTs  CT HEAD  Findings: A left frontotemporal/anterior left parafalcine subdural hematoma is identified measuring 5 mm in greatest diameter. 2 mm left to right midline shift is noted.  Chronic small vessel white matter ischemic changes are noted with heavy vascular calcifications.  There is no evidence of acute infarction, hydrocephalus or mass lesion. No acute or  suspicious bony abnormalities are noted.  IMPRESSION: Left frontotemporal/anterior left parafalcine subdural hematoma measuring 5 mm in greatest diameter with 2 mm left to right midline shift.  Chronic small vessel white matter ischemic changes.  CT MAXILLOFACIAL  Findings:  Left facial soft tissue swelling is identified. There is no evidence of acute fracture, subluxation or dislocation. The paranasal sinuses, mastoid air cells and middle/. The orbits and globes are unremarkable. No focal bony lesions are present.  IMPRESSION: Left facial soft tissue swelling without fracture.  CT CERVICAL SPINE  Findings:   Motion artifact degrades images at the C7-T1 articulation.  There is no evidence of acute fracture or subluxation. No prevertebral soft tissue swelling is noted. Moderate degenerative disc disease and spondylosis from C4-T1 noted. Mild to moderate facet arthropathy throughout the cervical spine identified. No soft tissue abnormalities are identified.  IMPRESSION: No static evidence of acute injury to  the spine.  Please note that motion artifact slightly limits evaluation.  Multilevel degenerative changes.  Critical Value/emergent results were called by telephone at the time of interpretation on 03/08/2012 at 2:30 p.m. to Dr. Ranae Palms, who verbally acknowledged these results.   Original Report Authenticated By: Harmon Pier, M.D.     Microbiology: Recent Results (from the past 240 hour(s))  URINE CULTURE     Status: None   Collection Time    03/08/12  3:31 PM      Result Value Range Status   Specimen Description URINE, CATHETERIZED   Final   Special Requests ADDED ON 03/08/12 AT 1821   Final   Culture  Setup Time 03/09/2012 01:46   Final   Colony Count NO GROWTH   Final   Culture NO GROWTH   Final   Report Status 03/10/2012 FINAL   Final  MRSA PCR SCREENING     Status: None   Collection Time    03/08/12 10:32 PM      Result Value Range Status   MRSA by PCR NEGATIVE  NEGATIVE Final   Comment:             The GeneXpert MRSA Assay (FDA     approved for NASAL specimens     only), is one component of a     comprehensive MRSA colonization     surveillance program. It is not     intended to diagnose MRSA     infection nor to guide or     monitor treatment for     MRSA infections.     Labs: Basic Metabolic Panel:  Recent Labs Lab 03/11/12 0555 03/13/12 0540 03/15/12 0555  NA 141 139 138  K 3.6 3.0* 4.2  CL 107 101 103  CO2 25 27 26   GLUCOSE 140* 145* 138*  BUN 24* 9 10  CREATININE 0.66 0.55 0.60  CALCIUM 8.9 9.4 9.4   Liver Function Tests: No results found for this basename: AST, ALT, ALKPHOS, BILITOT, PROT, ALBUMIN,  in the last 168 hours No results found for this basename: LIPASE, AMYLASE,  in the last 168 hours No results found for this basename: AMMONIA,  in the last 168 hours CBC:  Recent Labs Lab 03/11/12 0555  WBC 5.8  HGB 11.9*  HCT 35.0*  MCV 84.5  PLT 168   Cardiac Enzymes: No results found for this basename: CKTOTAL, CKMB, CKMBINDEX, TROPONINI,  in the last 168 hours BNP: BNP (last 3 results) No results found for this basename: PROBNP,  in the last 8760 hours CBG:  Recent Labs Lab 03/16/12 0629 03/16/12 1133 03/16/12 1629 03/16/12 2310 03/17/12 0639  GLUCAP 129* 129* 147* 138* 145*       Signed:  Rhetta Mura  [facility] 03/17/2012, 7:42 AM

## 2012-03-18 NOTE — Care Management Note (Signed)
    Page 1 of 1   03/18/2012     8:29:09 AM   CARE MANAGEMENT NOTE 03/18/2012  Patient:  CAMORA, TREMAIN   Account Number:  [id number]  Date Initiated:  03/12/2012  Documentation initiated by:  [facility]  Subjective/Objective Assessment:   admitted with acute encephalopathy     Action/Plan:   PT/OT evals-recommending SNF   Anticipated DC Date:  03/17/2012   Anticipated DC Plan:  SKILLED NURSING FACILITY  In-house referral  Clinical Social Worker      DC Planning Services  CM consult      Choice offered to / List presented to:             Status of service:  Completed, signed off Medicare Important Message given?   (If response is "NO", the following Medicare IM given date fields will be blank) Date Medicare IM given:   Date Additional Medicare IM given:    Discharge Disposition:  SKILLED NURSING FACILITY  Per UR Regulation:  Reviewed for med. necessity/level of care/duration of stay  If discussed at Long Length of Stay Meetings, dates discussed:    Comments:

## 2012-03-31 LAB — CBC AND DIFFERENTIAL
HCT: 33 % — AB (ref 36–46)
Hemoglobin: 11.2 g/dL — AB (ref 12.0–16.0)
Platelets: 220 10*3/uL (ref 150–399)
WBC: 3.5 10^3/mL

## 2012-04-11 ENCOUNTER — Encounter: Payer: Self-pay | Admitting: Nurse Practitioner

## 2012-04-11 ENCOUNTER — Non-Acute Institutional Stay (SKILLED_NURSING_FACILITY): Payer: Medicare Other | Admitting: Nurse Practitioner

## 2012-04-11 DIAGNOSIS — R609 Edema, unspecified: Secondary | ICD-10-CM

## 2012-04-11 DIAGNOSIS — N318 Other neuromuscular dysfunction of bladder: Secondary | ICD-10-CM

## 2012-04-11 DIAGNOSIS — M109 Gout, unspecified: Secondary | ICD-10-CM

## 2012-04-11 DIAGNOSIS — E119 Type 2 diabetes mellitus without complications: Secondary | ICD-10-CM

## 2012-04-11 MED ORDER — MIRABEGRON ER 25 MG PO TB24: 25.0000 mg | ORAL_TABLET | Freq: Every day | ORAL | Status: DC

## 2012-04-11 MED ORDER — COLCHICINE 0.6 MG PO TABS: 0.6000 mg | ORAL_TABLET | Freq: Every day | ORAL | Status: DC

## 2012-04-11 MED ORDER — METFORMIN HCL 500 MG PO TABS: 500.0000 mg | ORAL_TABLET | Freq: Two times a day (BID) | ORAL | Status: DC

## 2012-04-11 NOTE — Progress Notes (Signed)
Subjective:     Patient ID: Teresa Richards, female   DOB: [birth year], 79 y.o.   MRN: [id number]  HPI    04/10/12: blood sugar fasting in am: 122, 141, 110, 132, 141, 134, 143       Right forefoot erythema, tenderness, warmth, swelling for 2 days, X-ray 04/10/12 R ankle/foot showed no acute bony abnormalities. Uric acid 4.6 04/10/12       Urinary frequency nocturnal 4-5x/night w/o dysuria. UA 04/10/12 showed no growth  03/08/12-03/17/12 hospitalized for subdural hematoma(evaluated by Neurology-surgical intervention was not indicated) and left eye/face laceration(suture removed 03/16/12) sustained from a fall at AL unit where she has resided for 4-5 months.  The patient was noted to have slurred speech and lean to her left prior to ER and didn't recognize her dtr when her dtr arrived at ER--all resolved presently.  ER workup showed hypokalemia of 3.1(3.5 03/31/12), positive ketones in urine, CT head left frontotemporal subdural hematoma 5mm with 2mm left to right midline shift.  Bp 218/93--160/74 at SNF(takes Lisinopril 40mg  and Metoprolol 25mg  presently)              The patient was admitted to AL at [facility] after her fall in Grocery store initially. There are several falls there after. During one of her falls, she sustained occipital laceration which required ER evaluation and staple closure. 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 Removed by Dr. Camillia Herter in the [phone]. aso saw Dr. Edson Snowball at [facility]. Took alkeran for about 15 years after it was excised. 356.9-NEUROPATHY, PERIPHERAL  Not a confirmed diagnosis. She denies any loss of sensation in her feet. She says a podiatrist started her on this medication. 401.9-HTN UNSPECIFIED  not well controlled on Lisinopril40mg  and Metoprolol 25mg .  Dc'd ASA during hospitalization 02/2012 R>B 427.31-AFIB  Most recent EKG has an electronic reading of AF, but the EKG actually shows sinus arrhythmia with PVC.  427.89-BRADYCARDIA  sinus arrhythmia and  PVC 553.1-HERNIA, UMBILICAL The symptoms have not changed. Denies symptoms. 724.2-PAIN LOWER BACK The patient's low back pain has not changed.The patient notes stiffness.The patient relates good tolerance to the medication that is taken on an as needed basis. 729.1-PAIN MUSCLE  onset after falling while in grocery store, mainly in right shoulder and hip, noted faded bruises bilateral breast. Advil is effective.  Saw Dr. August Saucer.  733.01-SENILE OSTEOPOROSIS  suspicious for this problem. She has not had a bone density exam. 781.2-GAIT DISORDER   He It appears to be related to aging. 790.6-HYPERGLYCEMIA, NOS  diet controlled--monitor CBGs.  848.5-PELVIC SPRAIN AND STRAINS   pelvic discomfort of uncertain origin--healed. . Dr. Lequita Halt apparently feels that this was a strength. Cymbalta was started 11/09/11. V15.88-HISTORY OF FALL  frequently.     Review of Systems  Constitutional: Positive for fatigue.  HENT: Negative.   Eyes: Negative.   Respiratory: Negative.   Cardiovascular: Positive for leg swelling.  Gastrointestinal: Negative.   Endocrine: Positive for polyuria.  Genitourinary: Positive for frequency. Negative for dysuria and flank pain.  Musculoskeletal: Positive for joint swelling and gait problem.  Skin: Negative.   Allergic/Immunologic: Negative.   Neurological: Negative for tremors, seizures, syncope, facial asymmetry, speech difficulty, weakness, light-headedness, numbness and headaches.  Psychiatric/Behavioral: Positive for sleep disturbance.       Objective:   Physical Exam  Constitutional: She is oriented to person, place, and time. She appears well-developed and well-nourished.  HENT:  Head: Normocephalic and atraumatic.  Right Ear: External ear normal.  Left Ear: External ear  normal.  Eyes: Conjunctivae and EOM are normal. Pupils are equal, round, and reactive to light.  Neck: Normal range of motion. Neck supple. No JVD present. No thyromegaly present.   Cardiovascular: Normal rate and normal heart sounds.   No murmur heard. Pulmonary/Chest: Effort normal. She has rales (left basilar ).  Abdominal: Soft. Bowel sounds are normal. There is no tenderness.  Musculoskeletal: She exhibits edema (right forefoot tendernss, redness, fever, swelling, no decreased ROM) and tenderness.  Lymphadenopathy:    She has no cervical adenopathy.  Neurological: She is alert and oriented to person, place, and time. She has normal reflexes. She displays normal reflexes. No cranial nerve deficit. She exhibits normal muscle tone. Coordination normal.  Skin: Skin is warm and dry. There is erythema (right forefoot).  Psychiatric: She has a normal mood and affect. Her behavior is normal. Judgment and thought content normal.       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  Gout: presumed per clinical presentation  since uric acid wnl, venous Doppler to r/o DVT, Colchicine 0.6mg  daily for now.  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

## 2012-04-25 ENCOUNTER — Non-Acute Institutional Stay (SKILLED_NURSING_FACILITY): Payer: Medicare Other | Admitting: Nurse Practitioner

## 2012-04-25 DIAGNOSIS — M545 Low back pain: Secondary | ICD-10-CM

## 2012-04-25 DIAGNOSIS — I6200 Nontraumatic subdural hemorrhage, unspecified: Secondary | ICD-10-CM

## 2012-04-25 DIAGNOSIS — R269 Unspecified abnormalities of gait and mobility: Secondary | ICD-10-CM | POA: Insufficient documentation

## 2012-04-25 DIAGNOSIS — S065X9A Traumatic subdural hemorrhage with loss of consciousness of unspecified duration, initial encounter: Secondary | ICD-10-CM

## 2012-04-25 DIAGNOSIS — G609 Hereditary and idiopathic neuropathy, unspecified: Secondary | ICD-10-CM

## 2012-04-25 DIAGNOSIS — I10 Essential (primary) hypertension: Secondary | ICD-10-CM

## 2012-04-25 DIAGNOSIS — W19XXXD Unspecified fall, subsequent encounter: Secondary | ICD-10-CM

## 2012-04-25 NOTE — Progress Notes (Signed)
Subjective:     Patient ID: Teresa Richards, female   DOB: [birth year], 79 y.o.   MRN: [id number]  HPI         04/25/12 staff reported the patient has progression of balance problem--lean to her side until she falls, otherwise no focal neurological deficit.    03/08/12-03/17/12 hospitalized for subdural hematoma(evaluated by Neurology-surgical intervention was not indicated) and left eye/face laceration(suture removed 03/16/12) sustained from a fall at AL unit where she has resided for 4-5 months.  The patient was noted to have slurred speech and lean to her left prior to ER and didn't recognize her dtr when her dtr arrived at ER--all resolved presently.  ER workup showed hypokalemia of 3.1(3.5 03/31/12), positive ketones in urine, CT head left frontotemporal subdural hematoma 5mm with 2mm left to right midline shift.  Bp 218/93--160/74 at SNF(takes Lisinopril 40mg  and Metoprolol 25mg  presently)              The patient was admitted to AL at [facility] after her fall in Grocery store initially. There are several falls there after. During one of her falls, she sustained occipital laceration which required ER evaluation and staple closure.  MALIGNANT MELANOMA OF SKIN  Left shoulder. Removed by Dr. Camillia Herter in the [phone]. aso saw Dr. Edson Snowball at [facility]. Took alkeran for about 15 years after it was excised.  NEUROPATHY, PERIPHERAL  Not a confirmed diagnosis. She denies any loss of sensation in her feet. She says a podiatrist started her on this medication.  HTN UNSPECIFIED  not well controlled on Lisinopril40mg  and Metoprolol 25mg .  Dc'd ASA during hospitalization 02/2012 R>B  AFIB  Most recent EKG has an electronic reading of AF, but the EKG actually shows sinus arrhythmia with PVC.   BRADYCARDIA  sinus arrhythmia and PVC  HERNIA, UMBILICAL The symptoms have not changed. Denies symptoms.  PAIN LOWER BACK The patient's low back pain has not changed.The patient notes stiffness.The patient relates good tolerance to  the medication that is taken on an as needed basis.  PAIN MUSCLE  onset after falling while in grocery store, mainly in right shoulder and hip, noted faded bruises bilateral breast. Advil is effective.  Saw Dr. August Saucer.   SENILE OSTEOPOROSIS  suspicious for this problem. She has not had a bone density exam.  GAIT DISORDER   He It appears to be related to aging.  HYPERGLYCEMIA, NOS  diet controlled--monitor CBGs.   PELVIC SPRAIN AND STRAINS   pelvic discomfort of uncertain origin--healed. . Dr. Lequita Halt apparently feels that this was a strength. Cymbalta was started 11/09/11.  HISTORY OF FALL  frequently.       Review of Systems  Constitutional: Positive for fatigue.  HENT: Negative.   Eyes: Negative.   Respiratory: Negative.   Cardiovascular: Positive for leg swelling.  Gastrointestinal: Negative.   Endocrine: Positive for polyuria.  Genitourinary: Positive for frequency. Negative for dysuria and flank pain.  Musculoskeletal: Positive for joint swelling and gait problem.  Skin: Negative.   Allergic/Immunologic: Negative.   Neurological: Negative for tremors, seizures, syncope, facial asymmetry, speech difficulty, weakness, light-headedness, numbness and headaches.       Leaning to her side until she falls  Psychiatric/Behavioral: Positive for sleep disturbance.       Objective:   Physical Exam  Constitutional: She is oriented to person, place, and time. She appears well-developed and well-nourished.  HENT:  Head: Normocephalic and atraumatic.  Right Ear: External ear normal.  Left Ear: External ear normal.  Eyes: Conjunctivae and EOM are normal. Pupils are equal, round, and reactive to light.  Neck: Normal range of motion. Neck supple. No JVD present. No thyromegaly present.  Cardiovascular: Normal rate and normal heart sounds.   No murmur heard. Pulmonary/Chest: Effort normal. She has rales (left basilar ).  Abdominal: Soft. Bowel sounds are normal. There is no tenderness.   Musculoskeletal: She exhibits edema (right forefoot tendernss, redness, fever, swelling, no decreased ROM) and tenderness.  Lymphadenopathy:    She has no cervical adenopathy.  Neurological: She is alert and oriented to person, place, and time. She has normal reflexes. No cranial nerve deficit. She exhibits normal muscle tone. Coordination normal.  Skin: Skin is warm and dry. There is erythema (right forefoot).  Psychiatric: She has a normal mood and affect. Her behavior is normal. Judgment and thought content normal.      Lab Reviewed:  11/12/2011  CBC: Wbc 3.5, Rbc 3.96, Hgb 11.9, Hct 33.0, Platelet 272    CMP: Sodium 135, Potassium 4.0, glucose 124, BUN 11, Creatinine 0.68    Alk  127, Total Protein 5.9    Lipid: cholesterol 180, triglycerides 139, HDL 34, LDL 118    TSH 0.585    HgbA1c 6.5     Vitamin D 34      EKG: rate 90. Sinus arrhythmia with PVC. Electronic reading suggested atrial fibrillation, but this is not true.   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  Gout: presumed per clinical presentation  since uric acid wnl, venous Doppler to r/o DVT,-painful right forefoot--resolved, will dc Colchicine 0.6mg  Hypertonicity of bladder: better with  Mrybetriq 25mg  daily.  Balance/falling: repeat CT head w/o CM

## 2012-04-28 ENCOUNTER — Other Ambulatory Visit: Payer: Self-pay | Admitting: Internal Medicine

## 2012-04-28 DIAGNOSIS — R2689 Other abnormalities of gait and mobility: Secondary | ICD-10-CM

## 2012-04-29 ENCOUNTER — Ambulatory Visit
Admission: RE | Admit: 2012-04-29 | Discharge: 2012-04-29 | Disposition: A | Discharge status: Home / Self Care | Payer: Medicare Other | Source: Ambulatory Visit | Attending: Internal Medicine | Admitting: Internal Medicine

## 2012-04-29 DIAGNOSIS — R2689 Other abnormalities of gait and mobility: Secondary | ICD-10-CM

## 2012-05-13 ENCOUNTER — Non-Acute Institutional Stay (SKILLED_NURSING_FACILITY): Payer: Medicare Other | Admitting: Nurse Practitioner

## 2012-05-13 DIAGNOSIS — F411 Generalized anxiety disorder: Secondary | ICD-10-CM

## 2012-05-13 DIAGNOSIS — R269 Unspecified abnormalities of gait and mobility: Secondary | ICD-10-CM

## 2012-05-13 DIAGNOSIS — I10 Essential (primary) hypertension: Secondary | ICD-10-CM

## 2012-05-13 DIAGNOSIS — F0390 Unspecified dementia without behavioral disturbance: Secondary | ICD-10-CM

## 2012-05-13 DIAGNOSIS — S065X9A Traumatic subdural hemorrhage with loss of consciousness of unspecified duration, initial encounter: Secondary | ICD-10-CM

## 2012-05-13 DIAGNOSIS — E119 Type 2 diabetes mellitus without complications: Secondary | ICD-10-CM

## 2012-05-13 DIAGNOSIS — N318 Other neuromuscular dysfunction of bladder: Secondary | ICD-10-CM

## 2012-05-13 DIAGNOSIS — I6200 Nontraumatic subdural hemorrhage, unspecified: Secondary | ICD-10-CM

## 2012-05-13 NOTE — Progress Notes (Signed)
Patient ID: Teresa Richards, female   DOB: Sep 24, 1918, 79 y.o.   MRN: [id number]  Chief Complaint:  Chief Complaint  Patient presents with  . Medical Managment of Chronic Issues    abnomal gait and slow mentation.      HPI:  Problem List Items Addressed This Visit     ICD-9-CM   Subdural hematoma     Has repeated CT head w/o CM 04/29/12 stated interval clearing of the left frontal subdural hematoma.     Malignant hypertension     Controlled on Lisinopril 40mg  and Metoprolol 50mg      Diabetes mellitus     Metformin 500mg  bid started 04/11/12, CBG bid    Dementia     Seems her mentation is slower, obtain MMSE    Abnormality of gait - Primary     Therapy and staff reported the patient's poor balance, leaning to her side when she walks--not present to my examination today: she got up from w/c, walked 20 feet with walker and turned and walked back to her w/c and sat down independently. Staff requested Neurology consult today.     Hypertonicity of bladder     Improved on Myrbetriq    Anxiety state, unspecified     Stable on Cymbalta 30mg .        Review of Systems:  Review of Systems  Constitutional: Negative for fever, chills, weight loss and malaise/fatigue.  HENT: Positive for hearing loss. Negative for ear pain, nosebleeds, congestion, sore throat and neck pain.   Eyes: Negative for pain, discharge and redness.  Respiratory: Negative for cough, sputum production and wheezing.   Cardiovascular: Negative for chest pain, palpitations, orthopnea, claudication, leg swelling and PND.  Gastrointestinal: Negative for heartburn, nausea, vomiting, diarrhea and constipation.  Genitourinary: Positive for frequency (better). Negative for dysuria and urgency.  Musculoskeletal: Positive for back pain, joint pain and falls. Negative for myalgias.  Skin: Negative for itching and rash.  Neurological: Positive for weakness (generalized. ). Negative for dizziness, tingling, tremors, speech change,  focal weakness, seizures, loss of consciousness and headaches.  Endo/Heme/Allergies: Negative for environmental allergies and polydipsia. Does not bruise/bleed easily.  Psychiatric/Behavioral: Positive for memory loss. Negative for depression and hallucinations. The patient does not have insomnia.      Medications: [name] Medications  New Prescriptions   No medications on file  Previous Medications   COLCHICINE 0.6 MG TABLET    Take 1 tablet (0.6 mg total) by mouth daily.   DOCUSATE (COLACE) 50 MG/5ML LIQUID    Take by mouth See admin instructions. INSTILL 3-5 DROPS INTO EAR AT BEDTIME ON SATURDAY AND SUNDAY   DULOXETINE (CYMBALTA) 30 MG CAPSULE    Take 30 mg by mouth daily.   IBUPROFEN (ADVIL,MOTRIN) 200 MG TABLET    Take 200 mg by mouth every 6 (six) hours as needed. Pain   LISINOPRIL (PRINIVIL,ZESTRIL) 40 MG TABLET    Take 40 mg by mouth daily.   METFORMIN (GLUCOPHAGE) 500 MG TABLET    Take 1 tablet (500 mg total) by mouth 2 (two) times daily with a meal.   METOPROLOL SUCCINATE (TOPROL-XL) 25 MG 24 HR TABLET    Take 50 mg by mouth daily.    MIRABEGRON ER (MYRBETRIQ) 25 MG TB24    Take 1 tablet (25 mg total) by mouth daily.   MULTIPLE VITAMINS-MINERALS (CERTAVITE/ANTIOXIDANTS PO)    Take 1 tablet by mouth daily.  Modified Medications   No medications on file  Discontinued Medications   No medications  on file     Physical Exam: Physical Exam  Constitutional: She is oriented to person, place, and time. She appears well-developed and well-nourished.  HENT:  Head: Normocephalic and atraumatic.  Right Ear: External ear normal.  Left Ear: External ear normal.  Eyes: Conjunctivae and EOM are normal. Pupils are equal, round, and reactive to light.  Neck: Normal range of motion. Neck supple. No JVD present. No thyromegaly present.  Cardiovascular: Normal rate and normal heart sounds.   No murmur heard. Pulmonary/Chest: Effort normal. She has rales (left basilar ).  Abdominal: Soft.  Bowel sounds are normal. There is no tenderness.  Musculoskeletal: She exhibits edema (right forefoot tendernss, redness, fever, swelling, no decreased ROM) and tenderness.  Lymphadenopathy:    She has no cervical adenopathy.  Neurological: She is alert and oriented to person, place, and time. She has normal reflexes. No cranial nerve deficit. She exhibits normal muscle tone. Coordination normal.  Skin: Skin is warm and dry. There is erythema (right forefoot).  Psychiatric: She has a normal mood and affect. Her behavior is normal. Judgment and thought content normal.     Filed Vitals:   05/13/12 1700  BP: 100/70  Pulse: 72  Temp: 98 F (36.7 C)  TempSrc: Tympanic  Resp: 20      Labs reviewed: Basic Metabolic Panel:  Recent Labs  13/08/65 0555 03/13/12 0540 03/15/12 0555  NA 141 139 138  K 3.6 3.0* 4.2  CL 107 101 103  CO2 25 27 26   GLUCOSE 140* 145* 138*  BUN 24* 9 10  CREATININE 0.66 0.55 0.60  CALCIUM 8.9 9.4 9.4    Liver Function Tests:  Recent Labs  03/08/12 1420  AST 21  ALT 14  ALKPHOS 83  BILITOT 0.5  PROT 7.5  ALBUMIN 4.2    CBC:  Recent Labs  03/08/12 1420 03/09/12 0548 03/10/12 0515 03/11/12 0555  WBC 5.6 7.5 7.4 5.8  NEUTROABS 4.7  --   --   --   HGB 14.3 14.0 12.2 11.9*  HCT 41.4 40.3 36.7 35.0*  MCV 83.3 81.6 84.2 84.5  PLT 179 216 196 168    Anemia Panel: No results found for this basename: IRON, FOLATE, VITAMINB12,  in the last 8760 hours  Significant Diagnostic Results:  04/29/12 CT head w/o CM  IMPRESSION: Interval clearing of the left frontal subdural hematoma.   Assessment/Plan Abnormality of gait Therapy and staff reported the patient's poor balance, leaning to her side when she walks--not present to my examination today: she got up from w/c, walked 20 feet with walker and turned and walked back to her w/c and sat down independently. Staff requested Neurology consult today.   Subdural hematoma Has repeated CT head  w/o CM 04/29/12 stated interval clearing of the left frontal subdural hematoma.   Dementia Seems her mentation is slower, obtain MMSE  Diabetes mellitus Metformin 500mg  bid started 04/11/12, CBG bid  Malignant hypertension Controlled on Lisinopril 40mg  and Metoprolol 50mg    Hypertonicity of bladder Improved on Myrbetriq  Anxiety state, unspecified Stable on Cymbalta 30mg .      Family/ staff Communication: safety   Goals of care: possible AL   Labs/tests ordered TSH and MMSE

## 2012-05-13 NOTE — Assessment & Plan Note (Signed)
Improved on Myrbetriq

## 2012-05-13 NOTE — Assessment & Plan Note (Addendum)
Therapy and staff reported the patient's poor balance, leaning to her side when she walks--not present to my examination today: she got up from w/c, walked 20 feet with walker and turned and walked back to her w/c and sat down independently. Staff requested Neurology consult today. May consider holding Cymbalta for 2 weeks then re-evaluate her gait/balance/mood

## 2012-05-13 NOTE — Assessment & Plan Note (Signed)
Controlled on Lisinopril 40mg and Metoprolol 50mg            

## 2012-05-13 NOTE — Assessment & Plan Note (Signed)
Has repeated CT head w/o CM 04/29/12 stated interval clearing of the left frontal subdural hematoma.

## 2012-05-13 NOTE — Assessment & Plan Note (Signed)
Stable on Cymbalta 30mg  

## 2012-05-13 NOTE — Assessment & Plan Note (Addendum)
Seems her mentation is slower, obtain MMSE and check TSH

## 2012-05-13 NOTE — Assessment & Plan Note (Signed)
Metformin 500mg  bid started 04/11/12, CBG bid

## 2012-05-19 ENCOUNTER — Encounter (HOSPITAL_COMMUNITY): Payer: Self-pay | Admitting: Nurse Practitioner

## 2012-05-19 ENCOUNTER — Emergency Department (HOSPITAL_COMMUNITY): Payer: Medicare Other

## 2012-05-19 ENCOUNTER — Inpatient Hospital Stay (HOSPITAL_COMMUNITY)
Admission: EM | Admit: 2012-05-19 | Discharge: 2012-05-21 | DRG: 064 | Disposition: A | Discharge status: Skilled Nursing Facility | Payer: Medicare Other | Attending: Internal Medicine | Admitting: Internal Medicine

## 2012-05-19 ENCOUNTER — Inpatient Hospital Stay (HOSPITAL_COMMUNITY): Payer: Medicare Other

## 2012-05-19 DIAGNOSIS — R269 Unspecified abnormalities of gait and mobility: Secondary | ICD-10-CM

## 2012-05-19 DIAGNOSIS — I10 Essential (primary) hypertension: Secondary | ICD-10-CM

## 2012-05-19 DIAGNOSIS — F329 Major depressive disorder, single episode, unspecified: Secondary | ICD-10-CM | POA: Diagnosis present

## 2012-05-19 DIAGNOSIS — F32A Depression, unspecified: Secondary | ICD-10-CM

## 2012-05-19 DIAGNOSIS — N318 Other neuromuscular dysfunction of bladder: Secondary | ICD-10-CM

## 2012-05-19 DIAGNOSIS — Z66 Do not resuscitate: Secondary | ICD-10-CM | POA: Diagnosis present

## 2012-05-19 DIAGNOSIS — I6350 Cerebral infarction due to unspecified occlusion or stenosis of unspecified cerebral artery: Principal | ICD-10-CM | POA: Diagnosis present

## 2012-05-19 DIAGNOSIS — R4701 Aphasia: Secondary | ICD-10-CM | POA: Diagnosis present

## 2012-05-19 DIAGNOSIS — Z7982 Long term (current) use of aspirin: Secondary | ICD-10-CM

## 2012-05-19 DIAGNOSIS — Z8249 Family history of ischemic heart disease and other diseases of the circulatory system: Secondary | ICD-10-CM

## 2012-05-19 DIAGNOSIS — IMO0002 Reserved for concepts with insufficient information to code with codable children: Secondary | ICD-10-CM

## 2012-05-19 DIAGNOSIS — F0390 Unspecified dementia without behavioral disturbance: Secondary | ICD-10-CM

## 2012-05-19 DIAGNOSIS — E785 Hyperlipidemia, unspecified: Secondary | ICD-10-CM | POA: Diagnosis present

## 2012-05-19 DIAGNOSIS — W19XXXA Unspecified fall, initial encounter: Secondary | ICD-10-CM

## 2012-05-19 DIAGNOSIS — F3289 Other specified depressive episodes: Secondary | ICD-10-CM | POA: Diagnosis present

## 2012-05-19 DIAGNOSIS — G609 Hereditary and idiopathic neuropathy, unspecified: Secondary | ICD-10-CM

## 2012-05-19 DIAGNOSIS — S065XAA Traumatic subdural hemorrhage with loss of consciousness status unknown, initial encounter: Secondary | ICD-10-CM

## 2012-05-19 DIAGNOSIS — I639 Cerebral infarction, unspecified: Secondary | ICD-10-CM

## 2012-05-19 DIAGNOSIS — G8190 Hemiplegia, unspecified affecting unspecified side: Secondary | ICD-10-CM | POA: Diagnosis present

## 2012-05-19 DIAGNOSIS — E876 Hypokalemia: Secondary | ICD-10-CM | POA: Diagnosis present

## 2012-05-19 DIAGNOSIS — S065X9A Traumatic subdural hemorrhage with loss of consciousness of unspecified duration, initial encounter: Secondary | ICD-10-CM

## 2012-05-19 DIAGNOSIS — Z79899 Other long term (current) drug therapy: Secondary | ICD-10-CM

## 2012-05-19 DIAGNOSIS — F411 Generalized anxiety disorder: Secondary | ICD-10-CM

## 2012-05-19 DIAGNOSIS — G9349 Other encephalopathy: Secondary | ICD-10-CM | POA: Diagnosis present

## 2012-05-19 DIAGNOSIS — Z833 Family history of diabetes mellitus: Secondary | ICD-10-CM

## 2012-05-19 DIAGNOSIS — M545 Low back pain, unspecified: Secondary | ICD-10-CM

## 2012-05-19 DIAGNOSIS — E119 Type 2 diabetes mellitus without complications: Secondary | ICD-10-CM

## 2012-05-19 LAB — COMPREHENSIVE METABOLIC PANEL
ALT: 15 U/L (ref 0–35)
Alkaline Phosphatase: 90 U/L (ref 39–117)
BUN: 10 mg/dL (ref 6–23)
CO2: 28 mEq/L (ref 19–32)
Calcium: 10.1 mg/dL (ref 8.4–10.5)
GFR calc Af Amer: >90 mL/min (ref >90)
GFR calc non Af Amer: 81 mL/min — ABNORMAL LOW (ref >90)
Glucose, Bld: 247 mg/dL — ABNORMAL HIGH (ref 70–99)
Total Protein: 7.9 g/dL (ref 6.0–8.3)

## 2012-05-19 LAB — URINE MICROSCOPIC-ADD ON

## 2012-05-19 LAB — URINALYSIS, ROUTINE W REFLEX MICROSCOPIC
Bilirubin Urine: NEGATIVE
Glucose, UA: 250 mg/dL — AB
Ketones, ur: 15 mg/dL — AB
Leukocytes, UA: NEGATIVE
Protein, ur: >300 mg/dL — AB
pH: 7.5 (ref 5.0–8.0)

## 2012-05-19 LAB — GLUCOSE, CAPILLARY: Glucose-Capillary: 217 mg/dL — ABNORMAL HIGH (ref 70–99)

## 2012-05-19 LAB — CBC WITH DIFFERENTIAL/PLATELET
Eosinophils Absolute: 0 10*3/uL (ref 0.0–0.7)
Eosinophils Relative: 0 % (ref 0–5)
HCT: 42 % (ref 36.0–46.0)
Hemoglobin: 14.8 g/dL (ref 12.0–15.0)
Lymphocytes Relative: 8 % — ABNORMAL LOW (ref 12–46)
Lymphs Abs: 0.6 10*3/uL — ABNORMAL LOW (ref 0.7–4.0)
MCH: 28 pg (ref 26.0–34.0)
MCV: 79.5 fL (ref 78.0–100.0)
Monocytes Relative: 4 % (ref 3–12)
RBC: 5.28 MIL/uL — ABNORMAL HIGH (ref 3.87–5.11)
WBC: 7.5 10*3/uL (ref 4.0–10.5)

## 2012-05-19 LAB — CBC
MCH: 28.2 pg (ref 26.0–34.0)
MCHC: 35.4 g/dL (ref 30.0–36.0)
MCV: 79.7 fL (ref 78.0–100.0)
Platelets: 239 10*3/uL (ref 150–400)
RBC: 5.21 MIL/uL — ABNORMAL HIGH (ref 3.87–5.11)

## 2012-05-19 LAB — VITAMIN B12: Vitamin B-12: 751 pg/mL (ref 211–911)

## 2012-05-19 LAB — PROTIME-INR: Prothrombin Time: 13.1 seconds (ref 11.6–15.2)

## 2012-05-19 MED ORDER — KCL IN DEXTROSE-NACL 20-5-0.45 MEQ/L-%-% IV SOLN
INTRAVENOUS | Status: DC
  Administered 2012-05-19 – 2012-05-20 (×2): via INTRAVENOUS
  Filled 2012-05-19 (×4): qty 1000

## 2012-05-19 MED ORDER — INSULIN ASPART 100 UNIT/ML ~~LOC~~ SOLN
0.0000 [IU] | Freq: Three times a day (TID) | SUBCUTANEOUS | Status: DC
  Administered 2012-05-19: 3 [IU] via SUBCUTANEOUS
  Administered 2012-05-20: 1 [IU] via SUBCUTANEOUS
  Administered 2012-05-20: 3 [IU] via SUBCUTANEOUS
  Administered 2012-05-21: 2 [IU] via SUBCUTANEOUS
  Administered 2012-05-21: 1 [IU] via SUBCUTANEOUS

## 2012-05-19 MED ORDER — HEPARIN SODIUM (PORCINE) 5000 UNIT/ML IJ SOLN
5000.0000 [IU] | Freq: Three times a day (TID) | INTRAMUSCULAR | Status: DC
  Administered 2012-05-19 – 2012-05-21 (×6): 5000 [IU] via SUBCUTANEOUS
  Filled 2012-05-19 (×8): qty 1

## 2012-05-19 MED ORDER — METOPROLOL TARTRATE 1 MG/ML IV SOLN
5.0000 mg | Freq: Four times a day (QID) | INTRAVENOUS | Status: DC
  Administered 2012-05-19 – 2012-05-20 (×3): 5 mg via INTRAVENOUS
  Filled 2012-05-19 (×7): qty 5

## 2012-05-19 MED ORDER — HYDRALAZINE HCL 20 MG/ML IJ SOLN: 10.0000 mg | Freq: Three times a day (TID) | INTRAMUSCULAR | Status: DC | PRN

## 2012-05-19 MED ORDER — ASPIRIN 325 MG PO TABS
325.0000 mg | ORAL_TABLET | Freq: Every day | ORAL | Status: DC
  Administered 2012-05-20 – 2012-05-21 (×2): 325 mg via ORAL
  Filled 2012-05-19 (×2): qty 1

## 2012-05-19 MED ORDER — ONDANSETRON HCL 4 MG/2ML IJ SOLN: 4.0000 mg | Freq: Four times a day (QID) | INTRAMUSCULAR | Status: DC | PRN

## 2012-05-19 MED ORDER — ASPIRIN 300 MG RE SUPP
300.0000 mg | Freq: Every day | RECTAL | Status: DC
  Administered 2012-05-19: 300 mg via RECTAL
  Filled 2012-05-19 (×3): qty 1

## 2012-05-19 MED ORDER — SODIUM CHLORIDE 0.9 % IV SOLN
INTRAVENOUS | Status: AC
  Administered 2012-05-19: 13:00:00 via INTRAVENOUS

## 2012-05-19 NOTE — ED Notes (Signed)
[place], MD at bedside. 

## 2012-05-19 NOTE — Progress Notes (Signed)
Utilization review completed.  P.J. Sherle Mello,RN,BSN Case Manager [phone]  

## 2012-05-19 NOTE — ED Notes (Signed)
Daughter at bedside, pt states, "She had a bad concussion in January & we almost lost her. She has been becoming more & more weak & is scheduled a neuro consult this Friday."

## 2012-05-19 NOTE — ED Provider Notes (Signed)
History     CSN: [id number]  Arrival date & time 05/19/12  0919   First MD Initiated Contact with Patient 05/19/12 [phone]      Chief Complaint  Patient presents with  . Altered Mental Status    (Consider location/radiation/quality/duration/timing/severity/associated sxs/prior treatment) HPI Pt with L frontal subdural hematoma diagnosed in 2/14 not surgical candidate. Place in NH. Recent CT 4/7 showed clearing of SDH. Pt's daughter saw pt yesterday and she was speaking though drifting to the right. Found non-verbal by staff this AM at 0715. Pt with right sided weakness noted by EMS and elevated BP. Level 5 caveat.  Past Medical History  Diagnosis Date  . Diabetes mellitus without complication   . Hypertension   . Melanoma   . Dementia     No past surgical history on file.  No family history on file.  History  Substance Use Topics  . Smoking status: Never Smoker   . Smokeless tobacco: Never Used  . Alcohol Use: No    OB History   Grav Para Term Preterm Abortions TAB SAB Ect Mult Living                  Review of Systems  Unable to perform ROS: Mental status change    Allergies  Codeine  Home Medications   Current Outpatient Rx  Name  Route  Sig  Dispense  Refill  . aspirin 81 MG chewable tablet   Oral   Chew 81 mg by mouth daily.         Marland Kitchen docusate (COLACE) 50 MG/5ML liquid   Oral   Take by mouth See admin instructions. INSTILL 3-5 DROPS INTO EAR AT BEDTIME ON SATURDAY AND SUNDAY         . DULoxetine (CYMBALTA) 30 MG capsule   Oral   Take 30 mg by mouth daily.         Marland Kitchen HYDROcodone-acetaminophen (NORCO/VICODIN) 5-325 MG per tablet   Oral   Take 1 tablet by mouth every 4 (four) hours as needed for pain (severe pain).         Marland Kitchen ibuprofen (ADVIL,MOTRIN) 200 MG tablet   Oral   Take 200 mg by mouth every 6 (six) hours as needed. Pain         . lisinopril (PRINIVIL,ZESTRIL) 40 MG tablet   Oral   Take 40 mg by mouth daily.         .  metoprolol succinate (TOPROL-XL) 25 MG 24 hr tablet   Oral   Take 25 mg by mouth daily.          . Multiple Vitamins-Minerals (CERTAVITE/ANTIOXIDANTS PO)   Oral   Take 1 tablet by mouth daily.           BP 181/90  Temp(Src) 98.1 F (36.7 C) (Oral)  Resp 24  SpO2 90%  Physical Exam  Nursing note and vitals reviewed. Constitutional: She appears well-developed and well-nourished. No distress.  HENT:  Head: Normocephalic and atraumatic.  Mouth/Throat: Oropharynx is clear and moist.  Eyes: Pupils are equal, round, and reactive to light.  Left gaze deviation  Neck: Normal range of motion. Neck supple.  Cardiovascular: Normal rate and regular rhythm.   Pulmonary/Chest: Effort normal and breath sounds normal. No respiratory distress. She has no wheezes. She has no rales. She exhibits no tenderness.  Abdominal: Soft. Bowel sounds are normal. She exhibits no distension and no mass. There is no tenderness. There is no rebound and no guarding.  Musculoskeletal: Normal range of motion. She exhibits no edema and no tenderness.  Neurological:  Pt is nonverbal. +right sided neglect. Not moving RUE/RLE. She has good grip strength with L hand and is moving left foot. Follows simple commands.   Skin: Skin is warm and dry. No rash noted. No erythema.  Psychiatric: She has a normal mood and affect. Her behavior is normal.    ED Course  Procedures (including critical care time)  Labs Reviewed  CBC WITH DIFFERENTIAL - Abnormal; Notable for the following:    RBC 5.28 (*)    Neutrophils Relative 88 (*)    Lymphocytes Relative 8 (*)    Lymphs Abs 0.6 (*)    All other components within normal limits  COMPREHENSIVE METABOLIC PANEL - Abnormal; Notable for the following:    Sodium 134 (*)    Potassium 3.1 (*)    Chloride 93 (*)    Glucose, Bld 247 (*)    GFR calc non Af Amer 81 (*)    All other components within normal limits  APTT - Abnormal; Notable for the following:    aPTT 22 (*)     All other components within normal limits  PROTIME-INR  TROPONIN I  URINALYSIS, ROUTINE W REFLEX MICROSCOPIC   Ct Head Wo Contrast  05/19/2012  *RADIOLOGY REPORT*  Clinical Data: Altered mental status.  CT HEAD WITHOUT CONTRAST  Technique:  Contiguous axial images were obtained from the base of the skull through the vertex without contrast.  Comparison: 04/29/2012  Findings: There is a cerebral atrophy and diffuse low density throughout the periventricular and subcortical white matter.  There is no evidence for acute hemorrhage, mass lesion, midline shift, hydrocephalus or large infarct.  No acute bony abnormality.  IMPRESSION: No acute intracranial abnormality.  Atrophy and evidence for chronic small vessel ischemic changes.   Original Report Authenticated By: Richarda Overlie, M.D.    Dg Chest Port 1 View  05/19/2012  *RADIOLOGY REPORT*  Clinical Data: Altered mental status, pain  PORTABLE CHEST - 1 VIEW  Comparison: None.  Findings: Cardiomediastinal silhouette is unremarkable. Atherosclerotic calcifications of thoracic aorta.  No acute infiltrate or pleural effusion.  No pulmonary edema.  Mild elevation of the right hemidiaphragm.  IMPRESSION: No active disease.   Original Report Authenticated By: Natasha Mead, M.D.      1. CVA (cerebral infarction)       MDM  Discussed with daughter who is POA. Comfort meassures only with no heroic intervention. DNR at pt's bedside.   Code stroke not activated due to unknown onset, recent intracranial bleeding.   Dr Leroy Kennedy eval'd pt in ED. [facility] to see and admit for further workup.     Loren Racer, MD 05/19/12 1218

## 2012-05-19 NOTE — ED Notes (Signed)
[name] (daughter/POA) cell - [phone] home - [phone] Daughter took watch, eye glasses, and left ring Ralph Dowdy

## 2012-05-19 NOTE — H&P (Signed)
[facility] History and Physical  Teresa Richards ZOX:[id number] DOB: [birth year] DOA: 05/19/2012  Referring physician: Dr. Ranae Palms PCP: Kimber Relic, MD    Chief Complaint: AMS, right hemiparesis  HPI: Teresa Richards is a 79 y.o. female with a past medical history significant for diabetes, hypertension, severe dementia and depression; came to the hospital [facility] secondary to worsening of her mental status and decreased movement on her right side. Patient was last seen normal around 3 PM on 05/18/2012. Around 7 AM patient was found by nursing staff to be less communicative than a baseline and unable to move her right side. Per family information at baseline patient is very talkative and he is able to move right arm and legs without difficulty, in fact she stays on a wheelchair but is able to transfer herself back on forth as needed.   There has not been any complaints of fever, chills, chest pain, cough, abdominal discomfort, incontinence, diarrhea, nausea or vomiting.   In the ED a CT scan of her head failed to demonstrate any acute infarct in her blood work was essentially normal. [facility] has been called to me the patient for further evaluation and treatment.   Review of Systems:  Unable to assess or review secondary to patient at the mental status.  Past Medical History  Diagnosis Date  . Diabetes mellitus without complication   . Hypertension   . Melanoma   . Dementia    History reviewed. No pertinent past surgical history. Social History:  reports that she has never smoked. She has never used smokeless tobacco. She reports that she does not drink alcohol or use illicit drugs. Patient lives in [facility] facility, require assistance with activities of daily living.  Allergies  Allergen Reactions  . Codeine Other (See Comments)    Per MAR    Family history: Unable to be reviewed due to patient mental status.  Prior to Admission medications    Medication Sig Start Date End Date Taking? Authorizing Provider  aspirin 81 MG chewable tablet Chew 81 mg by mouth daily.   Yes Historical Provider, MD  docusate (COLACE) 50 MG/5ML liquid Take by mouth See admin instructions. INSTILL 3-5 DROPS INTO EAR AT BEDTIME ON SATURDAY AND SUNDAY   Yes Historical Provider, MD  DULoxetine (CYMBALTA) 30 MG capsule Take 30 mg by mouth daily.   Yes Historical Provider, MD  HYDROcodone-acetaminophen (NORCO/VICODIN) 5-325 MG per tablet Take 1 tablet by mouth every 4 (four) hours as needed for pain (severe pain).   Yes Historical Provider, MD  ibuprofen (ADVIL,MOTRIN) 200 MG tablet Take 200 mg by mouth every 6 (six) hours as needed. Pain   Yes Historical Provider, MD  lisinopril (PRINIVIL,ZESTRIL) 40 MG tablet Take 40 mg by mouth daily.   Yes Historical Provider, MD  metoprolol succinate (TOPROL-XL) 25 MG 24 hr tablet Take 25 mg by mouth daily.  04/04/12  Yes Historical Provider, MD  Multiple Vitamins-Minerals (CERTAVITE/ANTIOXIDANTS PO) Take 1 tablet by mouth daily.   Yes Historical Provider, MD   Physical Exam: Filed Vitals:   05/19/12 1200 05/19/12 1245 05/19/12 1308 05/19/12 1311  BP: 181/90 186/87 177/90 177/90  Pulse:   105 106  Temp:      TempSrc:      Resp: 25 26 25 26   SpO2:   93% 95%     General:  Awake, able to follow simple commands (intermittently), move limbs S. bronchitis leg with significant decrease in movement of her right  upper extremity.  Afebrile and nonverbal  Eyes: PERRLA, blinks bilaterally to threat, extra ocular muscles intact  ENT: Dry mucous membranes, no erythema or exudates inside her oropharynx, no discharges out of her ears or nostrils  Neck: Supple, no bruits, no JVD  Cardiovascular: S1 and S2, positive systolic ejection murmur, no rubs or gallops.  Respiratory: Clear to auscultation bilaterally  Abdomen: Soft, nondistended, positive bowel sounds  Skin: No rash or petechiae.  Musculoskeletal: No joint swelling or  erythema  Neurologic: Patient intermittently following commands, no febrile, awake but unable to assess orientation since she is not able to response. Spontaneous move of her limbs RUE 0/5 and RLE 2-3/5; rest 5/5)  Labs on Admission:  Basic Metabolic Panel:  Recent Labs Lab 05/19/12 0958  NA 134*  K 3.1*  CL 93*  CO2 28  GLUCOSE 247*  BUN 10  CREATININE 0.53  CALCIUM 10.1   Liver Function Tests:  Recent Labs Lab 05/19/12 0958  AST 21  ALT 15  ALKPHOS 90  BILITOT 0.5  PROT 7.9  ALBUMIN 4.3   CBC:  Recent Labs Lab 05/19/12 0958  WBC 7.5  NEUTROABS 6.6  HGB 14.8  HCT 42.0  MCV 79.5  PLT 232   Cardiac Enzymes:  Recent Labs Lab 05/19/12 0958  TROPONINI <0.30    Radiological Exams on Admission: Ct Head Wo Contrast  05/19/2012  *RADIOLOGY REPORT*  Clinical Data: Altered mental status.  CT HEAD WITHOUT CONTRAST  Technique:  Contiguous axial images were obtained from the base of the skull through the vertex without contrast.  Comparison: 04/29/2012  Findings: There is a cerebral atrophy and diffuse low density throughout the periventricular and subcortical white matter.  There is no evidence for acute hemorrhage, mass lesion, midline shift, hydrocephalus or large infarct.  No acute bony abnormality.  IMPRESSION: No acute intracranial abnormality.  Atrophy and evidence for chronic small vessel ischemic changes.   Original Report Authenticated By: Richarda Overlie, M.D.    Dg Chest Port 1 View  05/19/2012  *RADIOLOGY REPORT*  Clinical Data: Altered mental status, pain  PORTABLE CHEST - 1 VIEW  Comparison: None.  Findings: Cardiomediastinal silhouette is unremarkable. Atherosclerotic calcifications of thoracic aorta.  No acute infiltrate or pleural effusion.  No pulmonary edema.  Mild elevation of the right hemidiaphragm.  IMPRESSION: No active disease.   Original Report Authenticated By: Natasha Mead, M.D.     Assessment/Plan 1-after mental status/decreased mobility of her  right side: Most likely secondary to TIA vs left MCA Stroke. -Will admit to telemetry -Perform 2-D echo, carotid Dopplers, MRI, MRA of the brain without contrast, check fasting lipid panel and also hemoglobin A1c -N.p.o. after patient passes swallowing test -PT/OT/speech therapy -Neurology has been consulted and will follow further recommendations. For now while the patient is n.p.o. we'll continue per rectum aspirin for secondary prevention. See she was on aspirin prior to this event, she will require to be placed on Plavix. -Frequent neuro checks  2-Diabetes mellitus: check A1c as mentioned above. Sliding scale insulin for sugar control.  3-Dementia: Continue supportive care.  4-HTN (hypertension): Will start metoprolol IV schedule 5 mg every 6 hours and also use as needed hydralazine for elevated BP.  5-Depression: Plan is to continue patient's Cymbalta as soon as she is able to take by mouth.  6-hypokalemia: will be addedd to IVF's; will check Mg.  DVT: heparin    Neurology (Dr. Leroy Kennedy)  Code Status: DNR Family Communication: Daughter Teresa Richards) by phone Disposition Plan: Admit  to inpatient, telemetry bed; length of stay more than 2 midnights  Time spent: >30 minutes  Quinterious Walraven [facility] Pager [phone]  If 7PM-7AM, please contact night-coverage [email or web address] Password [facility] 05/19/2012, 1:36 PM

## 2012-05-19 NOTE — Consult Note (Signed)
Referring Physician: Ranae Palms    Chief Complaint: stroke  HPI:                                                                                                                                         Teresa Richards is an 79 y.o. female who lives in assisted living. She was last seen normal at 3 pm yesterday.  This AM around 0700 patient was found by staff to be less communicative and not moving her right side.  She was brought to the ED to further evaluate for CVA.  Daughter states at baseline she is very talkative and was able to move right arm and legs without difficulty.   Date last known well: 05/18/12 Time last known well: 1500 tPA Given: No: out of window  Past Medical History  Diagnosis Date  . Diabetes mellitus without complication   . Hypertension   . Melanoma   . Dementia     No past surgical history on file.  Family history: Mother: HTN, DM Father: HTN  Social History:  reports that she has never smoked. She has never used smokeless tobacco. She reports that she does not drink alcohol or use illicit drugs.  Allergies:  Allergies  Allergen Reactions  . Codeine Other (See Comments)    Per MAR    Medications:                                                                                                                           No current facility-administered medications for this encounter.   Current Outpatient Prescriptions  Medication Sig Dispense Refill  . aspirin 81 MG chewable tablet Chew 81 mg by mouth daily.      Marland Kitchen docusate (COLACE) 50 MG/5ML liquid Take by mouth See admin instructions. INSTILL 3-5 DROPS INTO EAR AT BEDTIME ON SATURDAY AND SUNDAY      . DULoxetine (CYMBALTA) 30 MG capsule Take 30 mg by mouth daily.      Marland Kitchen HYDROcodone-acetaminophen (NORCO/VICODIN) 5-325 MG per tablet Take 1 tablet by mouth every 4 (four) hours as needed for pain (severe pain).      Marland Kitchen ibuprofen (ADVIL,MOTRIN) 200 MG tablet Take 200 mg by mouth every 6 (six) hours as needed.  Pain      . lisinopril (PRINIVIL,ZESTRIL) 40 MG tablet Take 40 mg by mouth daily.      Marland Kitchen  metoprolol succinate (TOPROL-XL) 25 MG 24 hr tablet Take 25 mg by mouth daily.       . Multiple Vitamins-Minerals (CERTAVITE/ANTIOXIDANTS PO) Take 1 tablet by mouth daily.         ROS:                                                                                                                                       History obtained from daughter  General ROS: negative for - chills, fatigue, fever, night sweats, weight gain or weight loss Psychological ROS: negative for - behavioral disorder, hallucinations, memory difficulties, mood swings or suicidal ideation Ophthalmic ROS: negative for - blurry vision, double vision, eye pain or loss of vision ENT ROS: negative for - epistaxis, nasal discharge, oral lesions, sore throat, tinnitus or vertigo Allergy and Immunology ROS: negative for - hives or itchy/watery eyes Hematological and Lymphatic ROS: negative for - bleeding problems, bruising or swollen lymph nodes Endocrine ROS: negative for - galactorrhea, hair pattern changes, polydipsia/polyuria or temperature intolerance Respiratory ROS: negative for - cough, hemoptysis, shortness of breath or wheezing Cardiovascular ROS: negative for - chest pain, dyspnea on exertion, edema or irregular heartbeat Gastrointestinal ROS: negative for - abdominal pain, diarrhea, hematemesis, nausea/vomiting or stool incontinence Genito-Urinary ROS: negative for - dysuria, hematuria, incontinence or urinary frequency/urgency Musculoskeletal ROS: negative for - joint swelling or muscular weakness Neurological ROS: as noted in HPI Dermatological ROS: negative for rash and skin lesion changes  Neurologic Examination:                                                                                                      Blood pressure 190/93, temperature 98.1 F (36.7 C), temperature source Oral, resp. rate 22, SpO2  90.00%.  Mental Status: Alert, able to follow a few verbal commands such as closing her eyes and opening mouth.  Cranial Nerves: II: Discs flat bilaterally; Visual fields blinks to threat bilaterally,  pupils equal, round, reactive to light and accommodation III,IV, VI: ptosis not present, extra-ocular motions intact bilaterally V,VII: face symmetric, facial light touch sensation normal bilaterally VIII: hearing normal bilaterally IX,X: gag reflex present XI: bilateral shoulder shrug XII: midline tongue extension Motor: Right : Upper extremity   0/5    Left:     Upper extremity   5/5  Lower extremity   3/5     Lower extremity   5/5 --flaccid on the right arm and able to lift leg off the bed to pain Tone  and bulk:normal tone throughout; no atrophy noted Sensory: right UE and LE intact to pain, right arm no response to pain but did lift right leg to pain.  Deep Tendon Reflexes: 2+ bilateral UE (left >right), 1+ bilateral KJ (left > right) no AJ Plantars: Right: upgoing   Left: downgoing Cerebellar: Unable to assess CV: pulses palpable throughout     Results for orders placed during the hospital encounter of 05/19/12 (from the past 48 hour(s))  CBC WITH DIFFERENTIAL     Status: Abnormal   Collection Time    05/19/12  9:58 AM      Result Value Range   WBC 7.5  4.0 - 10.5 K/uL   RBC 5.28 (*) 3.87 - 5.11 MIL/uL   Hemoglobin 14.8  12.0 - 15.0 g/dL   HCT 09.8  11.9 - 14.7 %   MCV 79.5  78.0 - 100.0 fL   MCH 28.0  26.0 - 34.0 pg   MCHC 35.2  30.0 - 36.0 g/dL   RDW 82.9  56.2 - 13.0 %   Platelets 232  150 - 400 K/uL   Neutrophils Relative 88 (*) 43 - 77 %   Neutro Abs 6.6  1.7 - 7.7 K/uL   Lymphocytes Relative 8 (*) 12 - 46 %   Lymphs Abs 0.6 (*) 0.7 - 4.0 K/uL   Monocytes Relative 4  3 - 12 %   Monocytes Absolute 0.3  0.1 - 1.0 K/uL   Eosinophils Relative 0  0 - 5 %   Eosinophils Absolute 0.0  0.0 - 0.7 K/uL   Basophils Relative 0  0 - 1 %   Basophils Absolute 0.0  0.0 - 0.1  K/uL  COMPREHENSIVE METABOLIC PANEL     Status: Abnormal   Collection Time    05/19/12  9:58 AM      Result Value Range   Sodium 134 (*) 135 - 145 mEq/L   Potassium 3.1 (*) 3.5 - 5.1 mEq/L   Chloride 93 (*) 96 - 112 mEq/L   CO2 28  19 - 32 mEq/L   Glucose, Bld 247 (*) 70 - 99 mg/dL   BUN 10  6 - 23 mg/dL   Creatinine, Ser 8.65  0.50 - 1.10 mg/dL   Calcium 78.4  8.4 - 69.6 mg/dL   Total Protein 7.9  6.0 - 8.3 g/dL   Albumin 4.3  3.5 - 5.2 g/dL   AST 21  0 - 37 U/L   ALT 15  0 - 35 U/L   Alkaline Phosphatase 90  39 - 117 U/L   Total Bilirubin 0.5  0.3 - 1.2 mg/dL   GFR calc non Af Amer 81 (*) >90 mL/min   GFR calc Af Amer >90  >90 mL/min   Comment:            The eGFR has been calculated     using the CKD EPI equation.     This calculation has not been     validated in all clinical     situations.     eGFR's persistently     <90 mL/min signify     possible Chronic Kidney Disease.  PROTIME-INR     Status: None   Collection Time    05/19/12  9:58 AM      Result Value Range   Prothrombin Time 13.1  11.6 - 15.2 seconds   INR 1.00  0.00 - 1.49  TROPONIN I     Status: None   Collection Time  05/19/12  9:58 AM      Result Value Range   Troponin I <0.30  <0.30 ng/mL   Comment:            Due to the release kinetics of cTnI,     a negative result within the first hours     of the onset of symptoms does not rule out     myocardial infarction with certainty.     If myocardial infarction is still suspected,     repeat the test at appropriate intervals.  APTT     Status: Abnormal   Collection Time    05/19/12  9:58 AM      Result Value Range   aPTT 22 (*) 24 - 37 seconds   Ct Head Wo Contrast  05/19/2012  *RADIOLOGY REPORT*  Clinical Data: Altered mental status.  CT HEAD WITHOUT CONTRAST  Technique:  Contiguous axial images were obtained from the base of the skull through the vertex without contrast.  Comparison: 04/29/2012  Findings: There is a cerebral atrophy and diffuse  low density throughout the periventricular and subcortical white matter.  There is no evidence for acute hemorrhage, mass lesion, midline shift, hydrocephalus or large infarct.  No acute bony abnormality.  IMPRESSION: No acute intracranial abnormality.  Atrophy and evidence for chronic small vessel ischemic changes.   Original Report Authenticated By: Richarda Overlie, M.D.     Assessment and plan discussed with with attending physician and they are in agreement.    Felicie Morn PA-C [facility] [phone]  05/19/2012, 11:28 AM   Assessment: 79 y.o. female with new onset of right UE/ LE weakness and expressive>receptive aphasia. Likely acute left brain CVA although initial CT showed no acute infarct.   Stroke Risk Factors - diabetes mellitus and hypertension  Plan: 1. HgbA1c, fasting lipid panel 2. MRI, MRA  of the brain without contrast 3. PT consult, OT consult, Speech consult 4. Echocardiogram 5. Carotid dopplers 6. Prophylactic therapy-Antiplatelet med: Plavix - dose 75 mg daily after swallow screen is passed, until then PR ASA 7. Risk factor modification 8. Telemetry monitoring 9. Frequent neuro checks    Patient seen and examined together with physician assistant and I concur with the assessment and plan.  Wyatt Portela, MD

## 2012-05-19 NOTE — ED Notes (Signed)
Pt in from [facility] via [facility], per report pt baseline ambulatory & talkative, per report nursing staff said the daughter stated, "She wasn't acting right yesterday."  Unknown last seen normal, symptoms discovered 7:15, pt aphasic in route to ED, pt R sided deficits in route, EMS reports R sided facial droop in route

## 2012-05-20 DIAGNOSIS — R269 Unspecified abnormalities of gait and mobility: Secondary | ICD-10-CM

## 2012-05-20 DIAGNOSIS — F411 Generalized anxiety disorder: Secondary | ICD-10-CM

## 2012-05-20 LAB — BASIC METABOLIC PANEL
GFR calc Af Amer: 41 mL/min — ABNORMAL LOW (ref >90)
GFR calc non Af Amer: 35 mL/min — ABNORMAL LOW (ref >90)
Potassium: 3.8 mEq/L (ref 3.5–5.1)
Sodium: 135 mEq/L (ref 135–145)

## 2012-05-20 LAB — HEMOGLOBIN A1C
Hgb A1c MFr Bld: 6.5 % — ABNORMAL HIGH (ref <5.7)
Mean Plasma Glucose: 140 mg/dL — ABNORMAL HIGH (ref <117)

## 2012-05-20 LAB — CBC
MCHC: 34.8 g/dL (ref 30.0–36.0)
RDW: 15.3 % (ref 11.5–15.5)

## 2012-05-20 LAB — GLUCOSE, CAPILLARY
Glucose-Capillary: 136 mg/dL — ABNORMAL HIGH (ref 70–99)
Glucose-Capillary: 136 mg/dL — ABNORMAL HIGH (ref 70–99)

## 2012-05-20 LAB — LIPID PANEL
HDL: 38 mg/dL — ABNORMAL LOW (ref >39)
Triglycerides: 174 mg/dL — ABNORMAL HIGH (ref <150)

## 2012-05-20 MED ORDER — SODIUM CHLORIDE 0.9 % IV SOLN
INTRAVENOUS | Status: DC
  Administered 2012-05-20 – 2012-05-21 (×2): via INTRAVENOUS

## 2012-05-20 MED ORDER — ATORVASTATIN CALCIUM 20 MG PO TABS
20.0000 mg | ORAL_TABLET | Freq: Every day | ORAL | Status: DC
  Administered 2012-05-20: 20 mg via ORAL
  Filled 2012-05-20 (×2): qty 1

## 2012-05-20 NOTE — Progress Notes (Signed)
  Echocardiogram 2D Echocardiogram has been performed.  Teresa Richards FRANCES 05/20/2012, 6:27 PM 

## 2012-05-20 NOTE — Progress Notes (Signed)
VASCULAR LAB PRELIMINARY  PRELIMINARY  PRELIMINARY  PRELIMINARY  Carotid duplex  completed.    Preliminary report:  Bilateral:  No evidence of hemodynamically significant internal carotid artery stenosis.   Vertebral artery flow is antegrade.      Farhan Jean, RVT 05/20/2012, 3:28 PM

## 2012-05-20 NOTE — Evaluation (Signed)
Clinical/Bedside Swallow Evaluation Patient Details  Name: ALYCE INSCORE MRN: [id number] Date of Birth: Dec 17, 1918  Today's Date: 05/20/2012 Time: 0830-0906 SLP Time Calculation (min): 36 min  Past Medical History:  Past Medical History  Diagnosis Date  . Diabetes mellitus without complication   . Hypertension   . Melanoma   . Dementia    Past Surgical History: History reviewed. No pertinent past surgical history. HPI:  ANISHA STARLIPER is a 79 y.o. female with a past medical history significant for diabetes, hypertension, severe dementia and depression; came to the hospital from her skilled nursing facility secondary to worsening of her mental status and decreased movement on her right side. Patient was last seen normal around 3 PM on 05/18/2012. Around 7 AM patient was found by nursing staff to be less communicative than a baseline and unable to move her right side. Per family information at baseline patient is very talkative and he is able to move right arm and legs without difficulty, in fact she stays on a wheelchair but is able to transfer herself back on forth as needed. MD suspects left CVA. Ct negative.    Assessment / Plan / Recommendation Clinical Impression  Pt fully alert, able to feed herslef without difficulty. No evidence of aspiration or oral dysphagia. Pt may initiate a regular diet and thin liquids. No SLP f/u indicated.     Aspiration Risk  Mild    Diet Recommendation Regular;Thin liquid   Liquid Administration via: Cup;Straw Medication Administration: Whole meds with liquid Supervision: Patient able to self feed Postural Changes and/or Swallow Maneuvers: Seated upright 90 degrees    Other  Recommendations Oral Care Recommendations: Patient independent with oral care   Follow Up Recommendations       Frequency and Duration        Pertinent Vitals/Pain NA    SLP Swallow Goals     Swallow Study Prior Functional Status       General HPI: OGECHI KUEHNEL  is a 79 y.o. female with a past medical history significant for diabetes, hypertension, severe dementia and depression; came to the hospital from her skilled nursing facility secondary to worsening of her mental status and decreased movement on her right side. Patient was last seen normal around 3 PM on 05/18/2012. Around 7 AM patient was found by nursing staff to be less communicative than a baseline and unable to move her right side. Per family information at baseline patient is very talkative and he is able to move right arm and legs without difficulty, in fact she stays on a wheelchair but is able to transfer herself back on forth as needed. MD suspects left CVA. Ct negative.  Type of Study: Bedside swallow evaluation Diet Prior to this Study: NPO Temperature Spikes Noted: No Respiratory Status: Room air History of Recent Intubation: No Behavior/Cognition: Alert;Cooperative;Pleasant mood Oral Cavity - Dentition: Adequate natural dentition Self-Feeding Abilities: Able to feed self Patient Positioning: Upright in bed Baseline Vocal Quality: Clear Volitional Cough: Strong Volitional Swallow: Able to elicit    Oral/Motor/Sensory Function Overall Oral Motor/Sensory Function: Appears within functional limits for tasks assessed   Ice Chips     Thin Liquid Thin Liquid: Within functional limits    Nectar Thick Nectar Thick Liquid: Not tested   Honey Thick Honey Thick Liquid: Not tested   Puree Puree: Within functional limits   Solid   GO    Solid: Within functional limits       Yvonnie Schinke, Riley Nearing 05/20/2012,9:21 AM

## 2012-05-20 NOTE — Plan of Care (Signed)
Problem: Phase II Progression Outcomes Goal: Discharge plan established Recommend no further OT follow up after acute care d/c at this time

## 2012-05-20 NOTE — Evaluation (Signed)
Physical Therapy Evaluation Patient Details Name: Teresa Richards MRN: [id number] DOB: August 26, 1918 Today's Date: 05/20/2012 Time: 4098-1191 PT Time Calculation (min): 22 min  PT Assessment / Plan / Recommendation Clinical Impression  Pt adm with R sided weakness and AMS.  Although still confused, she participated well and shows no s/s of R sided weakness/HP.  Pt is like at or approaching baseline function.  She needs min guard to supervision when up in RW.  No further PT needs at this time.    PT Assessment  Patent does not need any further PT services    Follow Up Recommendations  No PT follow up;Other (comment) (can be managed with nursing mobility)    Does the patient have the potential to tolerate intense rehabilitation      Barriers to Discharge        Equipment Recommendations  None recommended by PT    Recommendations for Other Services     Frequency      Precautions / Restrictions Precautions Precautions: Fall Restrictions Weight Bearing Restrictions: No   Pertinent Vitals/Pain       Mobility  Bed Mobility Bed Mobility: Supine to Sit;Sitting - Scoot to Edge of Bed Supine to Sit: 5: Supervision;With rails;Other (comment) (HOB slightly elevated) Sitting - Scoot to Edge of Bed: 5: Supervision Details for Bed Mobility Assistance: used rail, but sat up safely to EOB with ease otherwise. Transfers Transfers: Sit to Stand;Stand to Sit Sit to Stand: 4: Min guard;With upper extremity assist;From bed;From chair/3-in-1 Stand to Sit: 4: Min guard;With upper extremity assist;To chair/3-in-1 Details for Transfer Assistance: steadied herself against the bed and use the rail, but stood otherwise without assist Ambulation/Gait Ambulation/Gait Assistance: 4: Min guard Ambulation Distance (Feet): 15 Feet (to BR and the 18 feet back to chair) Assistive device: Rolling walker Ambulation/Gait Assistance Details: guarded use of the RW, requiring min guard assist; generally steady  with the RW Gait Pattern: Step-through pattern Gait velocity: slowed Stairs: No    Exercises     PT Diagnosis:    PT Problem List: Decreased strength;Decreased balance;Decreased cognition PT Treatment Interventions:     PT Goals    Visit Information  Last PT Received On: 05/20/12 Assistance Needed: +1    Subjective Data  Subjective: We don't live in the same room, but on the same floor...Marland KitchenMarland KitchenIt's A living... Patient Stated Goal: Get back home   Prior Functioning  Home Living Lives With: Other (Comment) Available Help at Discharge: Skilled Nursing Facility;Available 24 hours/day Type of Home: Assisted living Home Access: Level entry Home Layout: One level Bathroom Shower/Tub: Health visitor: Handicapped height Bathroom Accessibility: Yes How Accessible: Accessible via wheelchair;Accessible via walker Home Adaptive Equipment: Walker - rolling;Walker - four wheeled Prior Function Level of Independence: Independent with assistive device(s) Able to Take Stairs?: No Vocation: Retired Musician: No difficulties Dominant Hand: Right    Cognition  Cognition Arousal/Alertness: Awake/alert Behavior During Therapy: WFL for tasks assessed/performed Overall Cognitive Status: History of cognitive impairments - at baseline    Extremity/Trunk Assessment Right Lower Extremity Assessment RLE ROM/Strength/Tone: [facility] for tasks assessed Left Lower Extremity Assessment LLE ROM/Strength/Tone: WFL for tasks assessed Trunk Assessment Trunk Assessment: Normal   Balance Balance Balance Assessed: Yes Static Sitting Balance Static Sitting - Balance Support: Feet supported;No upper extremity supported Static Sitting - Level of Assistance: 5: Stand by assistance  End of Session PT - End of Session Activity Tolerance: Patient tolerated treatment well Patient left: in chair;with call bell/phone within reach;with chair alarm  set Nurse Communication: Mobility  status  GP     Sadi Arave, Eliseo Gum 05/20/2012, 12:34 PM 05/20/2012  Noble Bing, PT [phone] (pager)

## 2012-05-20 NOTE — Evaluation (Addendum)
Occupational Therapy Evaluation Patient Details Name: KYLII ENNIS MRN: [id number] DOB: 05-06-1918 Today's Date: 05/20/2012 Time: 9562-1308 OT Time Calculation (min): 21 min  OT Assessment / Plan / Recommendation Clinical Impression  Pt hospitalized due to R sided weakness and AMS. Pt participated well and shows no s/s of R sided weakness/HP.  Pt demos set up/sup - min guard A with UB/LB ADLs and SBA/min guard A with ADL mobility. All education completed and no further acute or follow up OT required at this time    OT Assessment  Patient does not need any further OT services;All further OT needs can be met in the next venue of care    Follow Up Recommendations   Nursing staff contiune to assist pt with ADLs and ADL mobility   Barriers to Discharge  None    Equipment Recommendations   None   Recommendations for Other Services    Frequency       Precautions / Restrictions Precautions Precautions: Fall Restrictions Weight Bearing Restrictions: No   Pertinent Vitals/Pain     ADL  Grooming: Performed;Wash/dry hands;Wash/dry face;Teeth care;Supervision/safety;Set up Where Assessed - Grooming: Supported standing Upper Body Bathing: Simulated;Set up;Supervision/safety Lower Body Bathing: Min guard;Simulated Upper Body Dressing: Performed;Supervision/safety;Set up Lower Body Dressing: Performed;Min guard;Supervision/safety Toilet Transfer: Performed;Min guard Toilet Transfer Method: Sit to Barista: Regular height toilet;Grab bars Toileting - Clothing Manipulation and Hygiene: Performed;Min guard;Supervision/safety Where Assessed - Engineer, mining and Hygiene: Standing Equipment Used: Rolling walker;Gait belt Transfers/Ambulation Related to ADLs: verbal cues to keep walker close to body    OT Diagnosis:    OT Problem List:   OT Treatment Interventions:     OT Goals    Visit Information  Last OT Received On: 05/20/12 Assistance  Needed: +1    Subjective Data  Subjective: " I feel much better now, I passed out at [facility] " Patient Stated Goal: To return toALF   Prior Functioning     Home Living Lives With: Other (Comment) (ALF/SNF) Available Help at Discharge: Skilled Nursing Facility;Available 24 hours/day Type of Home: Assisted living Home Access: Level entry Home Layout: One level Bathroom Shower/Tub: Health visitor: Handicapped height Bathroom Accessibility: Yes How Accessible: Accessible via wheelchair;Accessible via walker Home Adaptive Equipment: Walker - rolling;Walker - four wheeled Prior Function Level of Independence: Independent with assistive device(s) Able to Take Stairs?: No Driving: No Vocation: Retired Musician: No difficulties Dominant Hand: Right         Vision/Perception Vision - History Baseline Vision: Wears glasses only for reading Patient Visual Report: No change from baseline Perception Perception: Within Functional Limits   Cognition  Cognition Arousal/Alertness: Awake/alert Behavior During Therapy: WFL for tasks assessed/performed Overall Cognitive Status: History of cognitive impairments - at baseline    Extremity/Trunk Assessment Right Upper Extremity Assessment RUE ROM/Strength/Tone: WFL for tasks assessed RUE Sensation: WFL - Light Touch RUE Coordination: WFL - gross/fine motor Left Upper Extremity Assessment LUE ROM/Strength/Tone: WFL for tasks assessed LUE Sensation: WFL - Light Touch LUE Coordination: WFL - gross/fine motor Right Lower Extremity Assessment RLE ROM/Strength/Tone: WFL for tasks assessed Left Lower Extremity Assessment LLE ROM/Strength/Tone: WFL for tasks assessed Trunk Assessment Trunk Assessment: Normal     Mobility Bed Mobility Bed Mobility: Supine to Sit;Sitting - Scoot to Edge of Bed Supine to Sit: 5: Supervision;With rails;Other (comment) (HOB slightly elevated) Sitting - Scoot to  Edge of Bed: 5: Supervision Details for Bed Mobility Assistance: used rail, but sat up safely to EOB  with ease otherwise. Transfers Transfers: Sit to Stand;Stand to Sit Sit to Stand: 4: Min guard;With upper extremity assist;From bed;From chair/3-in-1 Stand to Sit: 4: Min guard;With upper extremity assist;To chair/3-in-1 Details for Transfer Assistance: steadied herself against the bed and use the rail, but stood otherwise without assist     Exercise     Balance Balance Balance Assessed: Yes Static Sitting Balance Static Sitting - Balance Support: Feet supported;No upper extremity supported Static Sitting - Level of Assistance: 5: Stand by assistance Dynamic Standing Balance Dynamic Standing - Balance Support: During functional activity;Right upper extremity supported Dynamic Standing - Level of Assistance: 5: Stand by assistance   End of Session OT - End of Session Equipment Utilized During Treatment: Gait belt;Other (comment) (RW) Activity Tolerance: Patient tolerated treatment well Patient left: in chair;with call bell/phone within reach;with chair alarm set  GO     Galen Manila 05/20/2012, 2:00 PM

## 2012-05-20 NOTE — Evaluation (Signed)
Speech Language Pathology Evaluation Patient Details Name: Teresa Richards MRN: [id number] DOB: Sep 16, 1918 Today's Date: 05/20/2012 Time: 0830-0906 SLP Time Calculation (min): 36 min  Problem List:  Patient Active Problem List   Diagnosis Date Noted  . Stroke 05/19/2012  . HTN (hypertension) 05/19/2012  . Depression 05/19/2012  . 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 Anxiety state, unspecified 05/13/2012  . Unspecified hereditary and idiopathic peripheral neuropathy 04/25/2012  . Lumbago 04/25/2012  . Abnormality of gait 04/25/2012  . Fall at nursing home 03/08/2012  . Subdural hematoma 03/08/2012  . Malignant hypertension 03/08/2012  . Diabetes mellitus 03/08/2012  . Laceration- left eye/face 03/08/2012  . Dementia 03/08/2012   Past Medical History:  Past Medical History  Diagnosis Date  . Diabetes mellitus without complication   . Hypertension   . Melanoma   . Dementia    Past Surgical History: History reviewed. No pertinent past surgical history. HPI:  Teresa Richards is a 79 y.o. female with a past medical history significant for diabetes, hypertension, severe dementia and depression; came to the hospital from her skilled nursing facility secondary to worsening of her mental status and decreased movement on her right side. Patient was last seen normal around 3 PM on 05/18/2012. Around 7 AM patient was found by nursing staff to be less communicative than a baseline and unable to move her right side. Per family information at baseline patient is very talkative and he is able to move right arm and legs without difficulty, in fact she stays on a wheelchair but is able to transfer herself back on forth as needed. MD suspects left CVA. Ct negative.    Assessment / Plan / Recommendation Clinical Impression  Pt appears close to baseline cognitive funciton. Able to express needs and participate in conversation despite hearing loss. Mild memory impairment and mild difficulty with basic  problem solving with familiar and unfamiliar tasks, responsive to minimal cues. Pt is minimally disoriented due to unfamiliar surroundings. Do not feel there is any significant acute change. Dtr agrees. No SLP f/u needed at this time.     SLP Assessment  Patient does not need any further Speech Lanaguage Pathology Services    Follow Up Recommendations       Frequency and Duration        Pertinent Vitals/Pain NA   SLP Goals     SLP Evaluation Prior Functioning  Cognitive/Linguistic Baseline: Baseline deficits Baseline deficit details: mild dementia, balance, memory, able to do many self care ADL's independently Type of Home: Skilled Nursing Facility Vocation: Retired   IT consultant  Overall Cognitive Status: History of cognitive impairments - at baseline Arousal/Alertness: Awake/alert Orientation Level: Oriented to person;Oriented to place;Oriented to situation;Disoriented to time Attention: Focused;Sustained;Selective Focused Attention: Appears intact Sustained Attention: Appears intact Selective Attention: Appears intact Memory: Impaired Memory Impairment: Retrieval deficit;Decreased recall of new information;Decreased short term memory Decreased Short Term Memory: Verbal complex;Functional complex Awareness: Impaired Problem Solving: Impaired Problem Solving Impairment: Verbal complex;Functional basic Executive Function: Reasoning Safety/Judgment: Appears intact    Comprehension  Auditory Comprehension Overall Auditory Comprehension: Appears within functional limits for tasks assessed Interfering Components: Hearing    Expression Verbal Expression Overall Verbal Expression: Appears within functional limits for tasks assessed   Oral / Motor Oral Motor/Sensory Function Overall Oral Motor/Sensory Function: Appears within functional limits for tasks assessed Motor Speech Overall Motor Speech: Appears within functional limits for tasks assessed   GO    Harlon Ditty,  MA CCC-SLP [phone]  Claudine Mouton 05/20/2012,  9:26 AM

## 2012-05-20 NOTE — Progress Notes (Signed)
Stroke Team Progress Note  HISTORY Teresa Richards is an 79 y.o. female who lives in assisted living. She was last seen normal at 3 pm yesterday 05/18/2012. This AM 05/19/2012 around 0700 patient was found by staff to be less communicative and not moving her right side. She was brought to the ED to further evaluate for CVA. Daughter states at baseline she is very talkative and was able to move right arm and legs without difficulty. Patient was not a TPA candidate secondary to delay in arrival. She was admitted for further evaluation and treatment.  SUBJECTIVE   OBJECTIVE Most recent Vital Signs: Filed Vitals:   05/20/12 0030 05/20/12 0230 05/20/12 0600 05/20/12 1019  BP: 111/47 90/52 117/54 111/51  Pulse: 71 72 70 79  Temp: 97.3 F (36.3 C) 97.3 F (36.3 C) 97.4 F (36.3 C) 97.6 F (36.4 C)  TempSrc:    Oral  Resp: 18 18 18 18   SpO2: 95% 92% 94% 95%   CBG (last 3)   Recent Labs  05/19/12 2141 05/20/12 0653 05/20/12 1130  GLUCAP 247* 154* 229*    IV Fluid Intake:   . sodium chloride 75 mL/hr at 05/20/12 1022    MEDICATIONS  . aspirin  300 mg Rectal Daily   Or  . aspirin  325 mg Oral Daily  . atorvastatin  20 mg Oral q1800  . heparin  5,000 Units Subcutaneous Q8H  . insulin aspart  0-9 Units Subcutaneous TID WC   PRN:  hydrALAZINE, ondansetron (ZOFRAN) IV  Diet:  Carb Control thin liquids Activity:  OOB wwith assistance DVT Prophylaxis:  Heparin 5000 units sq tid  CLINICALLY SIGNIFICANT STUDIES Basic Metabolic Panel:  Recent Labs Lab 05/19/12 0958 05/19/12 1509 05/19/12 1510 05/20/12 0550  NA 134*  --   --  135  K 3.1*  --   --  3.8  CL 93*  --   --  95*  CO2 28  --   --  27  GLUCOSE 247*  --   --  154*  BUN 10  --   --  24*  CREATININE 0.53 0.61  --  1.29*  CALCIUM 10.1  --   --  9.3  MG  --   --  1.6  --    Liver Function Tests:  Recent Labs Lab 05/19/12 0958  AST 21  ALT 15  ALKPHOS 90  BILITOT 0.5  PROT 7.9  ALBUMIN 4.3   CBC:  Recent  Labs Lab 05/19/12 0958 05/19/12 1509 05/20/12 0550  WBC 7.5 9.2 5.6  NEUTROABS 6.6  --   --   HGB 14.8 14.7 12.7  HCT 42.0 41.5 36.5  MCV 79.5 79.7 79.9  PLT 232 239 232   Coagulation:  Recent Labs Lab 05/19/12 0958  LABPROT 13.1  INR 1.00   Cardiac Enzymes:  Recent Labs Lab 05/19/12 0958  TROPONINI <0.30   Urinalysis:  Recent Labs Lab 05/19/12 1235  COLORURINE YELLOW  LABSPEC 1.013  PHURINE 7.5  GLUCOSEU 250*  HGBUR TRACE*  BILIRUBINUR NEGATIVE  KETONESUR 15*  PROTEINUR >300*  UROBILINOGEN 0.2  NITRITE NEGATIVE  LEUKOCYTESUR NEGATIVE   Lipid Panel    Component Value Date/Time   CHOL 191 05/20/2012 0550   TRIG 174* 05/20/2012 0550   HDL 38* 05/20/2012 0550   CHOLHDL 5.0 05/20/2012 0550   VLDL 35 05/20/2012 0550   LDLCALC 118* 05/20/2012 0550   HgbA1C  Lab Results  Component Value Date   HGBA1C 6.5* 05/20/2012  Urine Drug Screen:   No results found for this basename: labopia, cocainscrnur, labbenz, amphetmu, thcu, labbarb    Alcohol Level: No results found for this basename: ETH,  in the last 168 hours  CT of the brain  05/19/2012   No acute intracranial abnormality.  Atrophy and evidence for chronic small vessel ischemic changes.     MRI of the brain  05/19/2012   No acute infarct.  Small vessel disease type changes.  Atrophy.  No intracranial hemorrhage (clearing of the previously MR detected left-sided subdural hematoma).   MRA of the brain  05/19/2012   Intracranial atherosclerotic type changes  2D Echocardiogram    Carotid Doppler  No evidence of hemodynamically significant internal carotid artery stenosis. Vertebral artery flow is antegrade.   CXR   05/19/2012  Low lung volumes, otherwise no acute cardiopulmonary abnormality.   05/19/2012  No active disease.    EKG  normal sinus rhythm.   Therapy Recommendations none  Physical Exam   Pleasant elderly lady not in distress.Awake alert. Afebrile. Head is nontraumatic. Neck is supple without  bruit. Hearing is normal. Cardiac exam no murmur or gallop. Lungs are clear to auscultation. Distal pulses are well felt. Neurological Exam ;  Awake  Alert disoriented x 3. Fluent speech and language..Diminished attention, registration and recall.eye movements full without nystagmus.fundi were not visualized. Vision acuity and fields appear normal. Hearing is normal. Palatal movements are normal. Face symmetric. Tongue midline. Normal strength, tone, reflexes and coordination. Normal sensation. Gait deferred. ASSESSMENT Teresa Richards is a 79 y.o. female presenting with decreased speech and not moving her right side. Imaging confirms a no acute  infarct. Dx:  Left brain TIA vs syncope. On aspirin 81 mg orally every day prior to admission. Now on aspirin 325 mg orally every day for secondary stroke prevention. Patient with no resultant neuro deficitis. Work up underway.  Diabetes, HgbA1c 6.5, at goal < 7.0 Hypertension Hyperlipidemia, LDL 118, not on statin PTA, on statin now, goal LDL < 100 (< 70 with diabetes) Baseline dementia  Hospital day # 1  TREATMENT/PLAN  Continue aspirin 325 mg orally every day for secondary stroke prevention.  F/u 2D echo  Annie Main, MSN, RN, ANVP-BC, ANP-BC, GNP-BC [facility] Pager: [phone] 05/20/2012 12:07 PM  I have personally obtained a history, examined the patient, evaluated imaging results, and formulated the assessment and plan of care. I agree with the above.   Delia Heady, MD

## 2012-05-20 NOTE — Progress Notes (Signed)
Patient ID: Teresa Richards  female  XBJ:[id number]    DOB: [birth year]    DOA: 05/19/2012  PCP: Kimber Relic, MD  Assessment/Plan: Principal Problem:   Stroke/TIA: Patient had presented with acute encephalopathy, right-sided hemiparesis. Per daughter, she was not recognizing family last night, currently mental status significantly improved. - Symptoms significantly improved today during examination, daughter at the bedside - MRI of the head was obtained which showed no acute infarct, global atrophy without hydrocephalus - MR A. showed intracranial atherosclerotic type changes -Continue full stroke workup, 2-D echo, carotid Dopplers, lipid panel - Continue telemetry, rule out any Afib or arrhythmias.  -Patient had failed RN swallow evaluation last night however she had repeat Swallow evaluation done by speech therapy and passed, started on carb modified diet.   Active Problems:   Diabetes mellitus - Continue carb modified diet, sliding scale insulin    Dementia: - Monitor closely, has a history of falls    HTN (hypertension)-currently stable   DVT Prophylaxis:  Code Status: DNR   Disposition: will DC to SNF/ALF in AM pending stability and w/u complete, d/w patient's daughter in detail at bed side    Subjective:  overall better today, no significant dysarthria noted, weakness improving   Objective: Weight change:  No intake or output data in the 24 hours ending 05/20/12 1354 Blood pressure 111/51, pulse 79, temperature 97.6 F (36.4 C), temperature source Oral, resp. rate 18, SpO2 95.00%.  Physical Exam: General: Alert and awake, oriented x3, not in any acute distress. CVS: S1-S2 clear, no murmur rubs or gallops Chest: clear to auscultation bilaterally, no wheezing, rales or rhonchi Abdomen: soft nontender, nondistended, normal bowel sounds Extremities: no cyanosis, clubbing or edema noted bilaterally Neuro: Cranial nerves II-XII intact, no focal neurological deficits  Lab  Results: Basic Metabolic Panel:  Recent Labs Lab 05/19/12 0958 05/19/12 1509 05/19/12 1510 05/20/12 0550  NA 134*  --   --  135  K 3.1*  --   --  3.8  CL 93*  --   --  95*  CO2 28  --   --  27  GLUCOSE 247*  --   --  154*  BUN 10  --   --  24*  CREATININE 0.53 0.61  --  1.29*  CALCIUM 10.1  --   --  9.3  MG  --   --  1.6  --    Liver Function Tests:  Recent Labs Lab 05/19/12 0958  AST 21  ALT 15  ALKPHOS 90  BILITOT 0.5  PROT 7.9  ALBUMIN 4.3   No results found for this basename: LIPASE, AMYLASE,  in the last 168 hours No results found for this basename: AMMONIA,  in the last 168 hours CBC:  Recent Labs Lab 05/19/12 0958 05/19/12 1509 05/20/12 0550  WBC 7.5 9.2 5.6  NEUTROABS 6.6  --   --   HGB 14.8 14.7 12.7  HCT 42.0 41.5 36.5  MCV 79.5 79.7 79.9  PLT 232 239 232   Cardiac Enzymes:  Recent Labs Lab 05/19/12 0958  TROPONINI <0.30   BNP: No components found with this basename: POCBNP,  CBG:  Recent Labs Lab 05/19/12 1608 05/19/12 2141 05/20/12 0653 05/20/12 1130  GLUCAP 217* 247* 154* 229*     Micro Results: No results found for this or any previous visit (from the past 240 hour(s)).  Studies/Results: Dg Chest 1 View  05/19/2012  *RADIOLOGY REPORT*  Clinical Data: 79 year old female with weakness.  Stroke.  CHEST - 1 VIEW  Comparison: 1138 hours the same day.  Findings: AP semi upright portable view 1411 hours.  Continued low lung volumes.  Stable cardiac size and mediastinal contours. Visualized tracheal air column is within normal limits.  No pneumothorax.  No pulmonary edema, pleural effusion or consolidation.  IMPRESSION: Low lung volumes, otherwise no acute cardiopulmonary abnormality.   Original Report Authenticated By: Erskine Speed, M.D.    Ct Head Wo Contrast  05/19/2012  *RADIOLOGY REPORT*  Clinical Data: Altered mental status.  CT HEAD WITHOUT CONTRAST  Technique:  Contiguous axial images were obtained from the base of the skull  through the vertex without contrast.  Comparison: 04/29/2012  Findings: There is a cerebral atrophy and diffuse low density throughout the periventricular and subcortical white matter.  There is no evidence for acute hemorrhage, mass lesion, midline shift, hydrocephalus or large infarct.  No acute bony abnormality.  IMPRESSION: No acute intracranial abnormality.  Atrophy and evidence for chronic small vessel ischemic changes.   Original Report Authenticated By: Richarda Overlie, M.D.    Ct Head Wo Contrast  04/29/2012  *RADIOLOGY REPORT*  Clinical Data: Fall 03/07/2012.  Followup left-sided subdural hematoma.  CT HEAD WITHOUT CONTRAST  Technique:  Contiguous axial images were obtained from the base of the skull through the vertex without contrast.  Comparison: 03/09/2012.  Findings: Interval clearing of previously noted left frontal subdural hematoma.  No new intracranial hemorrhage noted.  Global atrophy.  Ventricular prominence unchanged.  Small vessel disease type changes without CT evidence of large acute infarct.  No intracranial mass lesion detected on this unenhanced exam.  Prominent vascular calcifications and ectasia.  IMPRESSION: Interval clearing of the left frontal subdural hematoma.   Original Report Authenticated By: Lacy Duverney, M.D.    Mr Brain Wo Contrast  05/19/2012  *RADIOLOGY REPORT*  Clinical Data:  Diabetic hypertensive patient.  Dementia.  Change in mental status with decreased movement on the right side.  MRI BRAIN WITHOUT CONTRAST MRA HEAD WITHOUT CONTRAST  Technique: Multiplanar, multiecho pulse sequences of the brain and surrounding structures were obtained according to standard protocol without intravenous contrast.  Angiographic images of the head were obtained using MRA technique without contrast.  Comparison: 03/08/2012 MR.  05/19/2012 CT.  MRI HEAD  Findings:  No acute infarct.  No intracranial hemorrhage.  Moderate small vessel disease type changes.  Global atrophy without  hydrocephalus.  No intracranial mass lesion detected on this unenhanced exam.  Minimal partial opacification inferior aspect mastoid air cells.  Ectatic intracranial vasculature.  IMPRESSION: No acute infarct.  Small vessel disease type changes.  Atrophy.  No intracranial hemorrhage (clearing of the previously MR detected left-sided subdural hematoma).  MRA HEAD  Findings: Complex bulbous appearance surrounding the right carotid terminus may represent result of atherosclerotic type changes with a bilobed aneurysm a secondary consideration.  Anterior circulation without to medium or large size vessel significant stenosis or occlusion.  Branch vessel irregularity.  Markedly ectatic vertebral arteries and basilar artery with irregularity and regions of mild narrowing.  Irregularity and narrowing of the PICAs more notable on the left.  Nonvisualization AICAs.  Mild narrowing superior cerebellar artery bilaterally.  Moderate tandem stenosis posterior cerebral arteries bilaterally.  IMPRESSION: Intracranial atherosclerotic type changes as detailed above.   Original Report Authenticated By: Lacy Duverney, M.D.    Dg Chest Port 1 View  05/19/2012  *RADIOLOGY REPORT*  Clinical Data: Altered mental status, pain  PORTABLE CHEST - 1 VIEW  Comparison: None.  Findings: Cardiomediastinal  silhouette is unremarkable. Atherosclerotic calcifications of thoracic aorta.  No acute infiltrate or pleural effusion.  No pulmonary edema.  Mild elevation of the right hemidiaphragm.  IMPRESSION: No active disease.   Original Report Authenticated By: Natasha Mead, M.D.    Mr Mra Head/brain Wo Cm  05/19/2012  *RADIOLOGY REPORT*  Clinical Data:  Diabetic hypertensive patient.  Dementia.  Change in mental status with decreased movement on the right side.  MRI BRAIN WITHOUT CONTRAST MRA HEAD WITHOUT CONTRAST  Technique: Multiplanar, multiecho pulse sequences of the brain and surrounding structures were obtained according to standard protocol  without intravenous contrast.  Angiographic images of the head were obtained using MRA technique without contrast.  Comparison: 03/08/2012 MR.  05/19/2012 CT.  MRI HEAD  Findings:  No acute infarct.  No intracranial hemorrhage.  Moderate small vessel disease type changes.  Global atrophy without hydrocephalus.  No intracranial mass lesion detected on this unenhanced exam.  Minimal partial opacification inferior aspect mastoid air cells.  Ectatic intracranial vasculature.  IMPRESSION: No acute infarct.  Small vessel disease type changes.  Atrophy.  No intracranial hemorrhage (clearing of the previously MR detected left-sided subdural hematoma).  MRA HEAD  Findings: Complex bulbous appearance surrounding the right carotid terminus may represent result of atherosclerotic type changes with a bilobed aneurysm a secondary consideration.  Anterior circulation without to medium or large size vessel significant stenosis or occlusion.  Branch vessel irregularity.  Markedly ectatic vertebral arteries and basilar artery with irregularity and regions of mild narrowing.  Irregularity and narrowing of the PICAs more notable on the left.  Nonvisualization AICAs.  Mild narrowing superior cerebellar artery bilaterally.  Moderate tandem stenosis posterior cerebral arteries bilaterally.  IMPRESSION: Intracranial atherosclerotic type changes as detailed above.   Original Report Authenticated By: Lacy Duverney, M.D.     Medications: Scheduled Meds: . aspirin  300 mg Rectal Daily   Or  . aspirin  325 mg Oral Daily  . atorvastatin  20 mg Oral q1800  . heparin  5,000 Units Subcutaneous Q8H  . insulin aspart  0-9 Units Subcutaneous TID WC      LOS: 1 day   RAI,RIPUDEEP M.D. [facility] 05/20/2012, 1:54 PM Pager: [phone]  If 7PM-7AM, please contact night-coverage [email or web address] Password [email or web address]

## 2012-05-21 DIAGNOSIS — W19XXXA Unspecified fall, initial encounter: Secondary | ICD-10-CM

## 2012-05-21 LAB — BASIC METABOLIC PANEL
CO2: 27 mEq/L (ref 19–32)
Chloride: 102 mEq/L (ref 96–112)
Glucose, Bld: 116 mg/dL — ABNORMAL HIGH (ref 70–99)
Sodium: 138 mEq/L (ref 135–145)

## 2012-05-21 LAB — GLUCOSE, CAPILLARY
Glucose-Capillary: 157 mg/dL — ABNORMAL HIGH (ref 70–99)
Glucose-Capillary: 124 mg/dL — ABNORMAL HIGH (ref 70–99)

## 2012-05-21 MED ORDER — ASPIRIN 81 MG PO CHEW
81.0000 mg | CHEWABLE_TABLET | Freq: Once | ORAL | Status: AC
  Administered 2012-05-21: 81 mg via ORAL
  Filled 2012-05-21: qty 1

## 2012-05-21 MED ORDER — SODIUM CHLORIDE 0.9 % IV BOLUS (SEPSIS)
500.0000 mL | Freq: Once | INTRAVENOUS | Status: AC
  Administered 2012-05-21: 500 mL via INTRAVENOUS

## 2012-05-21 NOTE — Clinical Social Work Note (Signed)
Clinical Social Work   Pt is ready for discharge to [facility]. Facility has received discharge summary and is ready accept pt. Blue Medicare Berkley Harvey has been obtained. Pt and daughter are agreeable to discharge plan. [facility] will provide transportation. CSW is signing off as no further needs identified.   Dede Query, MSW, LCSW [phone]

## 2012-05-21 NOTE — Progress Notes (Signed)
Stroke Team Progress Note  HISTORY REVE Teresa Richards is an 79 y.o. female who lives in assisted living. She was last seen normal at 3 pm yesterday 05/18/2012. This AM 05/19/2012 around 0700 patient was found by staff to be less communicative and not moving her right side. She was brought to the ED to further evaluate for CVA. Daughter states at baseline she is very talkative and was able to move right arm and legs without difficulty. Patient was not a TPA candidate secondary to delay in arrival. She was admitted for further evaluation and treatment.  SUBJECTIVE Daughter at the bedside. Patient is eating lunch. She walked with a walker until Feb when she had a concussion. She has been up with assistance only since then. She has an outpatient neurological consult visit with Dr. Marjory Lies on 05/23/12  OBJECTIVE Most recent Vital Signs: Filed Vitals:   05/21/12 0539 05/21/12 0545 05/21/12 0623 05/21/12 0944  BP: 139/87   153/56  Pulse: 75   79  Temp: 98.2 F (36.8 C)   98.4 F (36.9 C)  TempSrc: Oral   Oral  Resp: 18   18  Height:   5\' 2"  (1.575 m)   Weight:  51.1 kg (112 lb 10.5 oz)    SpO2: 97%   97%   CBG (last 3)   Recent Labs  05/20/12 2142 05/21/12 0717 05/21/12 1137  GLUCAP 136* 157* 124*    IV Fluid Intake:   . sodium chloride 75 mL/hr at 05/21/12 0120    MEDICATIONS  . aspirin  300 mg Rectal Daily   Or  . aspirin  325 mg Oral Daily  . atorvastatin  20 mg Oral q1800  . heparin  5,000 Units Subcutaneous Q8H  . insulin aspart  0-9 Units Subcutaneous TID WC  . sodium chloride  500 mL Intravenous Once   PRN:  hydrALAZINE, ondansetron (ZOFRAN) IV  Diet:  Carb Control thin liquids Activity:  OOB wwith assistance DVT Prophylaxis:  Heparin 5000 units sq tid  CLINICALLY SIGNIFICANT STUDIES Basic Metabolic Panel:   Recent Labs Lab 05/19/12 0958 05/19/12 1509 05/19/12 1510 05/20/12 0550  NA 134*  --   --  135  K 3.1*  --   --  3.8  CL 93*  --   --  95*  CO2 28  --   --   27  GLUCOSE 247*  --   --  154*  BUN 10  --   --  24*  CREATININE 0.53 0.61  --  1.29*  CALCIUM 10.1  --   --  9.3  MG  --   --  1.6  --    Liver Function Tests:   Recent Labs Lab 05/19/12 0958  AST 21  ALT 15  ALKPHOS 90  BILITOT 0.5  PROT 7.9  ALBUMIN 4.3   CBC:   Recent Labs Lab 05/19/12 0958 05/19/12 1509 05/20/12 0550  WBC 7.5 9.2 5.6  NEUTROABS 6.6  --   --   HGB 14.8 14.7 12.7  HCT 42.0 41.5 36.5  MCV 79.5 79.7 79.9  PLT 232 239 232   Coagulation:   Recent Labs Lab 05/19/12 0958  LABPROT 13.1  INR 1.00   Cardiac Enzymes:   Recent Labs Lab 05/19/12 0958  TROPONINI <0.30   Urinalysis:   Recent Labs Lab 05/19/12 1235  COLORURINE YELLOW  LABSPEC 1.013  PHURINE 7.5  GLUCOSEU 250*  HGBUR TRACE*  BILIRUBINUR NEGATIVE  KETONESUR 15*  PROTEINUR >300*  UROBILINOGEN 0.2  NITRITE NEGATIVE  LEUKOCYTESUR NEGATIVE   Lipid Panel    Component Value Date/Time   CHOL 191 05/20/2012 0550   TRIG 174* 05/20/2012 0550   HDL 38* 05/20/2012 0550   CHOLHDL 5.0 05/20/2012 0550   VLDL 35 05/20/2012 0550   LDLCALC 118* 05/20/2012 0550   HgbA1C  Lab Results  Component Value Date   HGBA1C 6.5* 05/20/2012    Urine Drug Screen:   No results found for this basename: labopia,  cocainscrnur,  labbenz,  amphetmu,  thcu,  labbarb    Alcohol Level: No results found for this basename: ETH,  in the last 168 hours  CT of the brain  05/19/2012   No acute intracranial abnormality.  Atrophy and evidence for chronic small vessel ischemic changes.     MRI of the brain  05/19/2012   No acute infarct.  Small vessel disease type changes.  Atrophy.  No intracranial hemorrhage (clearing of the previously MR detected left-sided subdural hematoma).   MRA of the brain  05/19/2012   Intracranial atherosclerotic type changes  2D Echocardiogram    Carotid Doppler  No evidence of hemodynamically significant internal carotid artery stenosis. Vertebral artery flow is antegrade.   CXR    05/19/2012  Low lung volumes, otherwise no acute cardiopulmonary abnormality.   05/19/2012  No active disease.    EKG  normal sinus rhythm.   Therapy Recommendations none  Physical Exam   Pleasant elderly lady not in distress.Awake alert. Afebrile. Head is nontraumatic. Neck is supple without bruit. Hearing is normal. Cardiac exam no murmur or gallop. Lungs are clear to auscultation. Distal pulses are well felt. Neurological Exam ;  Awake  Alert disoriented x 3. Fluent speech and language..Diminished attention, registration and recall.eye movements full without nystagmus.fundi were not visualized. Vision acuity and fields appear normal. Hearing is normal. Palatal movements are normal. Face symmetric. Tongue midline. Normal strength, tone, reflexes and coordination. Normal sensation. Gait deferred.  ASSESSMENT Teresa Richards is a 79 y.o. female presenting with decreased speech and not moving her right side. Imaging confirms a no acute  infarct. Dx:  doubt Left brain TIA, most likely syncope. On aspirin 81 mg orally every day prior to admission. Now on aspirin 325 mg orally every day for secondary stroke prevention. Patient with no resultant neuro deficitis. Work up completed.  Diabetes, HgbA1c 6.5, at goal < 7.0 Hypertension Hyperlipidemia, LDL 118, not on statin PTA, on statin now, goal LDL < 100 (< 70 with diabetes) Baseline dementia Slow progress with PT and OT at SNF, they requested neuro evaluation and appt arranged for Friday at [facility]  Hospital day # 2  TREATMENT/PLAN  Continue aspirin 325 mg orally every day for secondary stroke prevention. No need to change to plavix as suspicion for TIA is low.  F/u 2D echo  Keep previously scheduled neuro evaluation and [facility] on Friday No further stroke workup indicated. Stroke Service will sign off. Please call should any needs arise.  Annie Main, MSN, RN, ANVP-BC, ANP-BC, [facility] Pager: [phone] 05/21/2012 11:54 AM  I have personally obtained a history, examined the patient, evaluated imaging results, and formulated the assessment and plan of care. I agree with the above.  Delia Heady, MD

## 2012-05-21 NOTE — Progress Notes (Signed)
[facility] PROGRESS NOTE  Assessment/Plan: Stroke: - MRI of the head 4.29.2014:was obtained which showed no acute infarct - MR A. showed intracranial atherosclerotic type changes - Echo pending - Carotid doppler pending - PT rec can be managed at SNF. - was on ASA at SNF, statin. Neurology on board  Diabetes mellitus  - Continue carb modified diet, sliding scale insulin  Dementia:  - Monitor closely, has a history of falls   HTN (hypertension)-currently stable   Code Status: DNR Family Communication: none  Disposition Plan: SNF   Consultants:  neuro  Procedures:  MRI/MRA  ECHO  Carotid  Antibiotics:  none (indicate start date, and stop date if known)  HPI/Subjective: No complains  Objective: Filed Vitals:   05/21/12 0539 05/21/12 0545 05/21/12 0623 05/21/12 0944  BP: 139/87   153/56  Pulse: 75   79  Temp: 98.2 F (36.8 C)   98.4 F (36.9 C)  TempSrc: Oral   Oral  Resp: 18   18  Height:   5\' 2"  (1.575 m)   Weight:  51.1 kg (112 lb 10.5 oz)    SpO2: 97%   97%   No intake or output data in the 24 hours ending 05/21/12 1025 Filed Weights   05/21/12 0545  Weight: 51.1 kg (112 lb 10.5 oz)    Exam:  General: Alert, awake, oriented x3, in no acute distress.  HEENT: No bruits, no goiter.  Heart: Regular rate and rhythm, without murmurs, rubs, gallops.  Lungs: Good air movement, clear to auscultation Abdomen: Soft, nontender, nondistended, positive bowel sounds.  Neuro: Grossly intact, nonfocal.   Data Reviewed: Basic Metabolic Panel:  Recent Labs Lab 05/19/12 0958 05/19/12 1509 05/19/12 1510 05/20/12 0550  NA 134*  --   --  135  K 3.1*  --   --  3.8  CL 93*  --   --  95*  CO2 28  --   --  27  GLUCOSE 247*  --   --  154*  BUN 10  --   --  24*  CREATININE 0.53 0.61  --  1.29*  CALCIUM 10.1  --   --  9.3  MG  --   --  1.6  --    Liver Function Tests:  Recent Labs Lab 05/19/12 0958  AST 21  ALT 15  ALKPHOS 90  BILITOT  0.5  PROT 7.9  ALBUMIN 4.3   No results found for this basename: LIPASE, AMYLASE,  in the last 168 hours No results found for this basename: AMMONIA,  in the last 168 hours CBC:  Recent Labs Lab 05/19/12 0958 05/19/12 1509 05/20/12 0550  WBC 7.5 9.2 5.6  NEUTROABS 6.6  --   --   HGB 14.8 14.7 12.7  HCT 42.0 41.5 36.5  MCV 79.5 79.7 79.9  PLT 232 239 232   Cardiac Enzymes:  Recent Labs Lab 05/19/12 0958  TROPONINI <0.30   BNP (last 3 results) No results found for this basename: PROBNP,  in the last 8760 hours CBG:  Recent Labs Lab 05/20/12 0653 05/20/12 1130 05/20/12 1644 05/20/12 2142 05/21/12 0717  GLUCAP 154* 229* 136* 136* 157*    No results found for this or any previous visit (from the past 240 hour(s)).   Studies: Dg Chest 1 View  05/19/2012  *RADIOLOGY REPORT*  Clinical Data: 79 year old female with weakness.  Stroke.  CHEST - 1 VIEW  Comparison: 1138 hours the same day.  Findings: AP semi upright portable view 1411  hours.  Continued low lung volumes.  Stable cardiac size and mediastinal contours. Visualized tracheal air column is within normal limits.  No pneumothorax.  No pulmonary edema, pleural effusion or consolidation.  IMPRESSION: Low lung volumes, otherwise no acute cardiopulmonary abnormality.   Original Report Authenticated By: Erskine Speed, M.D.    Mr Brain Wo Contrast  05/19/2012  *RADIOLOGY REPORT*  Clinical Data:  Diabetic hypertensive patient.  Dementia.  Change in mental status with decreased movement on the right side.  MRI BRAIN WITHOUT CONTRAST MRA HEAD WITHOUT CONTRAST  Technique: Multiplanar, multiecho pulse sequences of the brain and surrounding structures were obtained according to standard protocol without intravenous contrast.  Angiographic images of the head were obtained using MRA technique without contrast.  Comparison: 03/08/2012 MR.  05/19/2012 CT.  MRI HEAD  Findings:  No acute infarct.  No intracranial hemorrhage.  Moderate small  vessel disease type changes.  Global atrophy without hydrocephalus.  No intracranial mass lesion detected on this unenhanced exam.  Minimal partial opacification inferior aspect mastoid air cells.  Ectatic intracranial vasculature.  IMPRESSION: No acute infarct.  Small vessel disease type changes.  Atrophy.  No intracranial hemorrhage (clearing of the previously MR detected left-sided subdural hematoma).  MRA HEAD  Findings: Complex bulbous appearance surrounding the right carotid terminus may represent result of atherosclerotic type changes with a bilobed aneurysm a secondary consideration.  Anterior circulation without to medium or large size vessel significant stenosis or occlusion.  Branch vessel irregularity.  Markedly ectatic vertebral arteries and basilar artery with irregularity and regions of mild narrowing.  Irregularity and narrowing of the PICAs more notable on the left.  Nonvisualization AICAs.  Mild narrowing superior cerebellar artery bilaterally.  Moderate tandem stenosis posterior cerebral arteries bilaterally.  IMPRESSION: Intracranial atherosclerotic type changes as detailed above.   Original Report Authenticated By: Lacy Duverney, M.D.    Dg Chest Port 1 View  05/19/2012  *RADIOLOGY REPORT*  Clinical Data: Altered mental status, pain  PORTABLE CHEST - 1 VIEW  Comparison: None.  Findings: Cardiomediastinal silhouette is unremarkable. Atherosclerotic calcifications of thoracic aorta.  No acute infiltrate or pleural effusion.  No pulmonary edema.  Mild elevation of the right hemidiaphragm.  IMPRESSION: No active disease.   Original Report Authenticated By: Natasha Mead, M.D.    Mr Mra Head/brain Wo Cm  05/19/2012  *RADIOLOGY REPORT*  Clinical Data:  Diabetic hypertensive patient.  Dementia.  Change in mental status with decreased movement on the right side.  MRI BRAIN WITHOUT CONTRAST MRA HEAD WITHOUT CONTRAST  Technique: Multiplanar, multiecho pulse sequences of the brain and surrounding  structures were obtained according to standard protocol without intravenous contrast.  Angiographic images of the head were obtained using MRA technique without contrast.  Comparison: 03/08/2012 MR.  05/19/2012 CT.  MRI HEAD  Findings:  No acute infarct.  No intracranial hemorrhage.  Moderate small vessel disease type changes.  Global atrophy without hydrocephalus.  No intracranial mass lesion detected on this unenhanced exam.  Minimal partial opacification inferior aspect mastoid air cells.  Ectatic intracranial vasculature.  IMPRESSION: No acute infarct.  Small vessel disease type changes.  Atrophy.  No intracranial hemorrhage (clearing of the previously MR detected left-sided subdural hematoma).  MRA HEAD  Findings: Complex bulbous appearance surrounding the right carotid terminus may represent result of atherosclerotic type changes with a bilobed aneurysm a secondary consideration.  Anterior circulation without to medium or large size vessel significant stenosis or occlusion.  Branch vessel irregularity.  Markedly ectatic vertebral arteries and  basilar artery with irregularity and regions of mild narrowing.  Irregularity and narrowing of the PICAs more notable on the left.  Nonvisualization AICAs.  Mild narrowing superior cerebellar artery bilaterally.  Moderate tandem stenosis posterior cerebral arteries bilaterally.  IMPRESSION: Intracranial atherosclerotic type changes as detailed above.   Original Report Authenticated By: Lacy Duverney, M.D.     Scheduled Meds: . aspirin  300 mg Rectal Daily   Or  . aspirin  325 mg Oral Daily  . atorvastatin  20 mg Oral q1800  . heparin  5,000 Units Subcutaneous Q8H  . insulin aspart  0-9 Units Subcutaneous TID WC   Continuous Infusions: . sodium chloride 75 mL/hr at 05/21/12 0120     Marinda Elk  [facility] Pager [phone]. If 8PM-8AM, please contact night-coverage at [email or web address], password [facility] 05/21/2012, 10:25 AM  LOS: 2 days

## 2012-05-21 NOTE — Clinical Social Work Psychosocial (Signed)
Clinical Social Work Department BRIEF PSYCHOSOCIAL ASSESSMENT 05/21/2012  Patient:  Teresa Richards, Teresa Richards     Account Number:  [id number]     Admit date:  05/19/2012  Clinical Social Worker:  Demetrios Loll  Date/Time:  05/21/2012 12:17 PM  Referred by:  Physician  Date Referred:  05/20/2012 Referred for  Other - See comment   Other Referral:   Pt was admitted from facility   Interview type:  Patient Other interview type:   Pt's daughter, Teresa Richards, was present.    PSYCHOSOCIAL DATA Living Status:  FACILITY Admitted from facility:  [facility] Level of care:  Skilled Nursing Facility Primary support name:  Teresa Richards,Teresa Richards Primary support relationship to patient:  CHILD, ADULT Degree of support available:   Very supportive.    CURRENT CONCERNS Current Concerns  Post-Acute Placement   Other Concerns:    SOCIAL WORK ASSESSMENT / PLAN Clinical Social Worker (CSW) met with pt and daughter to address consult. Pt was admitted from [facility], where she was in the rehab facility. CSW introduced herself and explained role of social work. CSW also explained the process of discharging and returning to SNF. Pt shared that she would like to return to facility. CSW will initiate [employer] auth. CSW spoke with admissions coordinator at [facility] and pt is able to return, as pt has a bed hold. CSW will continue to follow.   Assessment/plan status:  Psychosocial Support/Ongoing Assessment of Needs Other assessment/ plan:   Information/referral to community resources:   Pt is a long time resident of [facility] at the ALF and SNF level.    [name]/FAMILY'S RESPONSE TO PLAN OF CARE: Pt is alert and very pleasant. Pt and daughter are agreeable to return to [facility] SNF.   Dede Query, MSW, LCSW [phone]

## 2012-05-21 NOTE — Discharge Summary (Addendum)
Physician Discharge Summary  Teresa Richards WUJ:[id number] DOB: [birth year] DOA: 05/19/2012  PCP: Kimber Relic, MD  Admit date: 05/19/2012 Discharge date: 06/03/2012  Time spent: 35  minutes  Recommendations for Outpatient Follow-up:  1. Follow up with [facility]  Discharge Diagnoses:  Principal Problem:   Stroke Active Problems:   Diabetes mellitus   Dementia   HTN (hypertension)   Depression   Discharge Condition: stable  Diet recommendation: carb modified  Filed Weights   05/21/12 0545  Weight: 51.1 kg (112 lb 10.5 oz)    History of present illness:  79 y.o. female with a past medical history significant for diabetes, hypertension, severe dementia and depression; came to the hospital from [facility] secondary to worsening of her mental status and decreased movement on her right side. Patient was last seen normal around 3 PM on 05/18/2012. Around 7 AM patient was found by nursing staff to be less communicative than a baseline and unable to move her right side. Per family information at baseline patient is very talkative and he is able to move right arm and legs without difficulty, in fact she stays on a wheelchair but is able to transfer herself back on forth as needed.  There has not been any complaints of fever, chills, chest pain, cough, abdominal discomfort, incontinence, diarrhea, nausea or vomiting.  In the ED a CT scan of her head failed to demonstrate any acute infarct in her blood work was essentially normal. [facility] hospitalist has been called to me the patient for further evaluation and treatment.   Hospital Course:  Gait imbalance: - MRI of the head 4.29.2014:was obtained which showed no acute infarct  - MR A. showed intracranial atherosclerotic type changes  - Echo follow up as an outpatient. - Carotid doppler: no stenosis - PT rec can be managed at SNF.  - Was on ASA at SNF, statin.  - Will follow up with [facility]. - most  likely cause of her symptoms was due to decrease intravascular volume.  Diabetes mellitus  - Continue carb modified diet, sliding scale insulin   Dementia:  - Monitor closely, has a history of falls.  HTN (hypertension)-currently stable   Procedures:  MRI/MRA  Carotid doppler  echo  Consultations:  NEurology  Discharge Exam: Filed Vitals:   05/21/12 0545 05/21/12 0623 05/21/12 0944 05/21/12 1400  BP:   153/56 149/53  Pulse:   79 76  Temp:   98.4 F (36.9 C) 98.7 F (37.1 C)  TempSrc:   Oral Oral  Resp:   18 18  Height:  5\' 2"  (1.575 m)    Weight: 51.1 kg (112 lb 10.5 oz)     SpO2:   97% 97%    General: see progress note Discharge Instructions      Discharge Orders   Future Orders Complete By Expires     Diet - low sodium heart healthy  As directed     Increase activity slowly  As directed         Medication List    TAKE these medications       aspirin 81 MG chewable tablet  Chew 81 mg by mouth daily.     atorvastatin 20 MG tablet  Commonly known as:  LIPITOR  Take 1 tablet (20 mg total) by mouth daily.     CERTAVITE/ANTIOXIDANTS PO  Take 1 tablet by mouth daily.     DULoxetine 30 MG capsule  Commonly known as:  CYMBALTA  Take  30 mg by mouth daily.     HYDROcodone-acetaminophen 5-325 MG per tablet  Commonly known as:  NORCO/VICODIN  Take 1 tablet by mouth every 4 (four) hours as needed for pain (severe pain).     ibuprofen 200 MG tablet  Commonly known as:  ADVIL,MOTRIN  Take 200 mg by mouth every 6 (six) hours as needed. Pain     lisinopril 40 MG tablet  Commonly known as:  PRINIVIL,ZESTRIL  Take 40 mg by mouth daily.     metoprolol succinate 25 MG 24 hr tablet  Commonly known as:  TOPROL-XL  Take 50 mg by mouth daily.       Follow-up Information   Follow up with [facility]. (Keep appt already scheduled for Fri of this week)        The results of significant diagnostics from this hospitalization (including imaging,  microbiology, ancillary and laboratory) are listed below for reference.    Significant Diagnostic Studies: Dg Chest 1 View  05/19/2012  *RADIOLOGY REPORT*  Clinical Data: 79 year old female with weakness.  Stroke.  CHEST - 1 VIEW  Comparison: 1138 hours the same day.  Findings: AP semi upright portable view 1411 hours.  Continued low lung volumes.  Stable cardiac size and mediastinal contours. Visualized tracheal air column is within normal limits.  No pneumothorax.  No pulmonary edema, pleural effusion or consolidation.  IMPRESSION: Low lung volumes, otherwise no acute cardiopulmonary abnormality.   Original Report Authenticated By: Erskine Speed, M.D.    Ct Head Wo Contrast  05/19/2012  *RADIOLOGY REPORT*  Clinical Data: Altered mental status.  CT HEAD WITHOUT CONTRAST  Technique:  Contiguous axial images were obtained from the base of the skull through the vertex without contrast.  Comparison: 04/29/2012  Findings: There is a cerebral atrophy and diffuse low density throughout the periventricular and subcortical white matter.  There is no evidence for acute hemorrhage, mass lesion, midline shift, hydrocephalus or large infarct.  No acute bony abnormality.  IMPRESSION: No acute intracranial abnormality.  Atrophy and evidence for chronic small vessel ischemic changes.   Original Report Authenticated By: Richarda Overlie, M.D.    Ct Head Wo Contrast  04/29/2012  *RADIOLOGY REPORT*  Clinical Data: Fall 03/07/2012.  Followup left-sided subdural hematoma.  CT HEAD WITHOUT CONTRAST  Technique:  Contiguous axial images were obtained from the base of the skull through the vertex without contrast.  Comparison: 03/09/2012.  Findings: Interval clearing of previously noted left frontal subdural hematoma.  No new intracranial hemorrhage noted.  Global atrophy.  Ventricular prominence unchanged.  Small vessel disease type changes without CT evidence of large acute infarct.  No intracranial mass lesion detected on this  unenhanced exam.  Prominent vascular calcifications and ectasia.  IMPRESSION: Interval clearing of the left frontal subdural hematoma.   Original Report Authenticated By: Lacy Duverney, M.D.    Mr Brain Wo Contrast  05/19/2012  *RADIOLOGY REPORT*  Clinical Data:  Diabetic hypertensive patient.  Dementia.  Change in mental status with decreased movement on the right side.  MRI BRAIN WITHOUT CONTRAST MRA HEAD WITHOUT CONTRAST  Technique: Multiplanar, multiecho pulse sequences of the brain and surrounding structures were obtained according to standard protocol without intravenous contrast.  Angiographic images of the head were obtained using MRA technique without contrast.  Comparison: 03/08/2012 MR.  05/19/2012 CT.  MRI HEAD  Findings:  No acute infarct.  No intracranial hemorrhage.  Moderate small vessel disease type changes.  Global atrophy without hydrocephalus.  No intracranial mass lesion detected  on this unenhanced exam.  Minimal partial opacification inferior aspect mastoid air cells.  Ectatic intracranial vasculature.  IMPRESSION: No acute infarct.  Small vessel disease type changes.  Atrophy.  No intracranial hemorrhage (clearing of the previously MR detected left-sided subdural hematoma).  MRA HEAD  Findings: Complex bulbous appearance surrounding the right carotid terminus may represent result of atherosclerotic type changes with a bilobed aneurysm a secondary consideration.  Anterior circulation without to medium or large size vessel significant stenosis or occlusion.  Branch vessel irregularity.  Markedly ectatic vertebral arteries and basilar artery with irregularity and regions of mild narrowing.  Irregularity and narrowing of the PICAs more notable on the left.  Nonvisualization AICAs.  Mild narrowing superior cerebellar artery bilaterally.  Moderate tandem stenosis posterior cerebral arteries bilaterally.  IMPRESSION: Intracranial atherosclerotic type changes as detailed above.   Original Report  Authenticated By: Lacy Duverney, M.D.    Dg Chest Port 1 View  05/19/2012  *RADIOLOGY REPORT*  Clinical Data: Altered mental status, pain  PORTABLE CHEST - 1 VIEW  Comparison: None.  Findings: Cardiomediastinal silhouette is unremarkable. Atherosclerotic calcifications of thoracic aorta.  No acute infiltrate or pleural effusion.  No pulmonary edema.  Mild elevation of the right hemidiaphragm.  IMPRESSION: No active disease.   Original Report Authenticated By: Natasha Mead, M.D.    Mr Mra Head/brain Wo Cm  05/19/2012  *RADIOLOGY REPORT*  Clinical Data:  Diabetic hypertensive patient.  Dementia.  Change in mental status with decreased movement on the right side.  MRI BRAIN WITHOUT CONTRAST MRA HEAD WITHOUT CONTRAST  Technique: Multiplanar, multiecho pulse sequences of the brain and surrounding structures were obtained according to standard protocol without intravenous contrast.  Angiographic images of the head were obtained using MRA technique without contrast.  Comparison: 03/08/2012 MR.  05/19/2012 CT.  MRI HEAD  Findings:  No acute infarct.  No intracranial hemorrhage.  Moderate small vessel disease type changes.  Global atrophy without hydrocephalus.  No intracranial mass lesion detected on this unenhanced exam.  Minimal partial opacification inferior aspect mastoid air cells.  Ectatic intracranial vasculature.  IMPRESSION: No acute infarct.  Small vessel disease type changes.  Atrophy.  No intracranial hemorrhage (clearing of the previously MR detected left-sided subdural hematoma).  MRA HEAD  Findings: Complex bulbous appearance surrounding the right carotid terminus may represent result of atherosclerotic type changes with a bilobed aneurysm a secondary consideration.  Anterior circulation without to medium or large size vessel significant stenosis or occlusion.  Branch vessel irregularity.  Markedly ectatic vertebral arteries and basilar artery with irregularity and regions of mild narrowing.  Irregularity  and narrowing of the PICAs more notable on the left.  Nonvisualization AICAs.  Mild narrowing superior cerebellar artery bilaterally.  Moderate tandem stenosis posterior cerebral arteries bilaterally.  IMPRESSION: Intracranial atherosclerotic type changes as detailed above.   Original Report Authenticated By: Lacy Duverney, M.D.     Microbiology: No results found for this or any previous visit (from the past 240 hour(s)).   Labs: Basic Metabolic Panel: No results found for this basename: NA, K, CL, CO2, GLUCOSE, BUN, CREATININE, CALCIUM, MG, PHOS,  in the last 168 hours Liver Function Tests: No results found for this basename: AST, ALT, ALKPHOS, BILITOT, PROT, ALBUMIN,  in the last 168 hours No results found for this basename: LIPASE, AMYLASE,  in the last 168 hours No results found for this basename: AMMONIA,  in the last 168 hours CBC: No results found for this basename: WBC, NEUTROABS, HGB, HCT, MCV, PLT,  in the last 168 hours Cardiac Enzymes: No results found for this basename: CKTOTAL, CKMB, CKMBINDEX, TROPONINI,  in the last 168 hours BNP: BNP (last 3 results) No results found for this basename: PROBNP,  in the last 8760 hours CBG: No results found for this basename: GLUCAP,  in the last 168 hours  Signed:  Marinda Elk  [facility] 06/03/2012, 10:28 AM

## 2012-05-22 NOTE — Care Management Note (Signed)
    Page 1 of 1   05/22/2012     11:22:03 AM   CARE MANAGEMENT NOTE 05/22/2012  Patient:  Teresa Richards, Teresa Richards   Account Number:  [id number]  Date Initiated:  05/22/2012  Documentation initiated by:  [facility]  Subjective/Objective Assessment:   admitted with CVA     Action/Plan:   return to SNF   Anticipated DC Date:     Anticipated DC Plan:    In-house referral  Clinical Social Worker      DC Planning Services  CM consult      Choice offered to / List presented to:             Status of service:  Completed, signed off Medicare Important Message given?   (If response is "NO", the following Medicare IM given date fields will be blank) Date Medicare IM given:   Date Additional Medicare IM given:    Discharge Disposition:  SKILLED NURSING FACILITY  Per UR Regulation:  Reviewed for med. necessity/level of care/duration of stay  If discussed at Long Length of Stay Meetings, dates discussed:    Comments:

## 2012-05-23 ENCOUNTER — Non-Acute Institutional Stay (SKILLED_NURSING_FACILITY): Payer: Medicare Other | Admitting: Nurse Practitioner

## 2012-05-23 ENCOUNTER — Ambulatory Visit (INDEPENDENT_AMBULATORY_CARE_PROVIDER_SITE_OTHER): Payer: Medicare Other | Admitting: Diagnostic Neuroimaging

## 2012-05-23 ENCOUNTER — Encounter: Payer: Self-pay | Admitting: Diagnostic Neuroimaging

## 2012-05-23 VITALS — BP 167/83 | HR 77 | Temp 98.5°F

## 2012-05-23 DIAGNOSIS — I6200 Nontraumatic subdural hemorrhage, unspecified: Secondary | ICD-10-CM

## 2012-05-23 DIAGNOSIS — S065XAA Traumatic subdural hemorrhage with loss of consciousness status unknown, initial encounter: Secondary | ICD-10-CM

## 2012-05-23 DIAGNOSIS — F0390 Unspecified dementia without behavioral disturbance: Secondary | ICD-10-CM

## 2012-05-23 DIAGNOSIS — E876 Hypokalemia: Secondary | ICD-10-CM

## 2012-05-23 DIAGNOSIS — E119 Type 2 diabetes mellitus without complications: Secondary | ICD-10-CM

## 2012-05-23 DIAGNOSIS — I10 Essential (primary) hypertension: Secondary | ICD-10-CM

## 2012-05-23 DIAGNOSIS — G459 Transient cerebral ischemic attack, unspecified: Secondary | ICD-10-CM | POA: Insufficient documentation

## 2012-05-23 DIAGNOSIS — S065X9A Traumatic subdural hemorrhage with loss of consciousness of unspecified duration, initial encounter: Secondary | ICD-10-CM

## 2012-05-23 DIAGNOSIS — F411 Generalized anxiety disorder: Secondary | ICD-10-CM

## 2012-05-23 DIAGNOSIS — R269 Unspecified abnormalities of gait and mobility: Secondary | ICD-10-CM

## 2012-05-23 DIAGNOSIS — N318 Other neuromuscular dysfunction of bladder: Secondary | ICD-10-CM

## 2012-05-23 NOTE — Assessment & Plan Note (Signed)
Previously on Metformin, will monitor fasting CBG and resume Metformin as indicated.    

## 2012-05-23 NOTE — Assessment & Plan Note (Addendum)
Stable on Cymbalta 30mg --hold Cymbalta and monitor gait .

## 2012-05-23 NOTE — Progress Notes (Signed)
[facility]  PATIENT: Teresa Richards DOB: 08-07-1918  REFERRING CLINICIAN:  HISTORY FROM: patient and daughter REASON FOR VISIT: gait difficulty, weakness   HISTORICAL  CHIEF COMPLAINT:  Chief Complaint  Patient presents with  . Gait Problem    Np in room 7    HISTORY OF PRESENT ILLNESS: 79 year old right-handed female here for follow-up of recent syncopal episode/possible TIA  Feb 2014 in which she was admitted to the hospital.  Pt. States she is doing good but "just can't walk".  Subdural hematoma was found in left frontal region.  Pt experienced right sided weakness and slight right facial droop.  Patient denies numbness or tingling in extremities today. She was walking with a 4-wheeled walker before this episode.  She started using the 4-wheeled walker in Nov 2013.  She was still driving in Oct. 2013 and had been in independent living.  She resides now at the [facility].  Her husband is in Hospice.   REVIEW OF SYSTEMS: Full 14 system review of systems performed and notable only for fatigue blurred vision double vision loss of vision easy bruising memory loss confusion balance difficulty decreased energy incontinence.  ALLERGIES: Allergies  Allergen Reactions  . Codeine Other (See Comments)    Per [facility]    HOME MEDICATIONS: Outpatient Prescriptions Prior to Visit  Medication Sig Dispense Refill  . aspirin 81 MG chewable tablet Chew 81 mg by mouth daily.      . DULoxetine (CYMBALTA) 30 MG capsule Take 30 mg by mouth daily.      Marland Kitchen HYDROcodone-acetaminophen (NORCO/VICODIN) 5-325 MG per tablet Take 1 tablet by mouth every 4 (four) hours as needed for pain (severe pain).      Marland Kitchen ibuprofen (ADVIL,MOTRIN) 200 MG tablet Take 200 mg by mouth every 6 (six) hours as needed. Pain      . lisinopril (PRINIVIL,ZESTRIL) 40 MG tablet Take 40 mg by mouth daily.      . metoprolol succinate (TOPROL-XL) 25 MG 24 hr tablet Take 50 mg by mouth daily.       . Multiple  Vitamins-Minerals (CERTAVITE/ANTIOXIDANTS PO) Take 1 tablet by mouth daily.      Marland Kitchen docusate (COLACE) 50 MG/5ML liquid Take by mouth See admin instructions. INSTILL 3-5 DROPS INTO EAR AT BEDTIME ON SATURDAY AND SUNDAY       No facility-administered medications prior to visit.    PAST MEDICAL HISTORY: Past Medical History  Diagnosis Date  . Diabetes mellitus without complication   . Hypertension   . Melanoma   . Dementia     PAST SURGICAL HISTORY: No past surgical history on file.  FAMILY HISTORY: Family History  Problem Relation Age of Onset  . Leukemia Father     SOCIAL HISTORY:  History   Social History  . Marital Status: Married    Spouse Name: N/A    Number of Children: 1  . Years of Education: 12   Occupational History  . Not on file.   Social History Main Topics  . Smoking status: Never Smoker   . Smokeless tobacco: Never Used  . Alcohol Use: No     Comment: Patient drinks coffee occasional.  . Drug Use: No  . Sexually Active: No   Other Topics Concern  . Not on file   Social History Narrative  . No narrative on file     PHYSICAL EXAM  Filed Vitals:   05/23/12 0924  BP: 167/83  Pulse: 77  Temp: 98.5  F (36.9 C)  TempSrc: Oral   There is no weight on file to calculate BMI.  GENERAL EXAM: Patient is in no distress  CARDIOVASCULAR: Regular rate and rhythm, no murmurs, no carotid bruits  NEUROLOGIC: MENTAL STATUS: awake, alert, language fluent, comprehension intact, naming intact CRANIAL NERVE: no papilledema on fundoscopic exam, pupils equal and reactive to light, visual fields full to confrontation, extraocular muscles intact, no nystagmus, facial sensation and strength symmetric, uvula midline, shoulder shrug symmetric, tongue midline. MOTOR: normal bulk and tone, full strength in the BUE, BLE SENSORY: RIGHT TOE VIB 4 SEC, LEFT TOE VIB 8 SEC, COORDINATION: finger-nose-finger, fine finger movements normal REFLEXES: BUE 3, KNEES 2, ANKLES  1. GAIT/STATION: STOOPED POSTURE. SLOW GAIT. UNSTEADY.   DIAGNOSTIC DATA (LABS, IMAGING, TESTING) - I reviewed patient records, labs, notes, testing and imaging myself where available.  Lab Results  Component Value Date   WBC 5.6 05/20/2012   HGB 12.7 05/20/2012   HCT 36.5 05/20/2012   MCV 79.9 05/20/2012   PLT 232 05/20/2012      Component Value Date/Time   NA 138 05/21/2012 1513   NA 139 03/31/2012   K 3.0* 05/21/2012 1513   CL 102 05/21/2012 1513   CO2 27 05/21/2012 1513   GLUCOSE 116* 05/21/2012 1513   BUN 18 05/21/2012 1513   BUN 9 03/31/2012   CREATININE 0.82 05/21/2012 1513   CREATININE 0.7 03/31/2012   CALCIUM 9.2 05/21/2012 1513   PROT 7.9 05/19/2012 0958   ALBUMIN 4.3 05/19/2012 0958   AST 21 05/19/2012 0958   ALT 15 05/19/2012 0958   ALKPHOS 90 05/19/2012 0958   BILITOT 0.5 05/19/2012 0958   GFRNONAA 61* 05/21/2012 1513   GFRAA 70* 05/21/2012 1513   Lab Results  Component Value Date   CHOL 191 05/20/2012   HDL 38* 05/20/2012   LDLCALC 118* 05/20/2012   TRIG 174* 05/20/2012   CHOLHDL 5.0 05/20/2012   Lab Results  Component Value Date   HGBA1C 6.5* 05/20/2012   Lab Results  Component Value Date   VITAMINB12 751 05/19/2012   Lab Results  Component Value Date   TSH 0.332* 05/19/2012    05/19/12 MRI brain - moderate-severe perisylvian and mesial temporal atrophy; moderate chronic small vessel ischemic disease; no subdural hematoma   ASSESSMENT AND PLAN  79 y.o. year old female  has a past medical history of Diabetes mellitus without complication; Hypertension; Melanoma; and Dementia. here with weakness and gait difficulty. Multi-factorial (mild dementia, neurodegenerative, hyperreflexia and possible cervical spinal stenosis, diabetic neuropathy).  Given patient's advanced age and other medical conditions I do not think is worthwhile to pursue aggressive intervention. I suggest conservative management with physical therapy to maintain flexibility, conditioning and strength. We  discussed MRI of the cervical spine, however patient is not a surgical candidate at this time so we'll hold off.  Suanne Marker, MD (with LYNN LAM NP-C 05/23/2012, 9:55 AM) Certified in Neurology, Neurophysiology and Neuroimaging  [facility] [address] [place], [place] [address] [phone]

## 2012-05-23 NOTE — Assessment & Plan Note (Addendum)
ASA 81mg  started. MRI head 4/281/4 no intracranial hemorrhage(clearing of the previously MR detected left sided subdural hematoma). MRA 05/19/12 intracranial atherosclerotic type changes. Carotid Doppler showed no stenosis. F/u Neurology. Her right sided weakness was resolved

## 2012-05-23 NOTE — Progress Notes (Signed)
Patient ID: Teresa Richards, female   DOB: 1918-03-11, 79 y.o.   MRN: [id number] Code Status:  Chief Complaint  Patient presents with  . Hospitalization Follow-up    AMS, TIA, subdural hematoma.      Allergies  Allergen Reactions  . Codeine Other (See Comments)    Per [facility]    Chief Complaint  Patient presents with  . Hospitalization Follow-up    AMS, TIA, subdural hematoma.     HPI: Patient is a 79 y.o. female seen in the SNF at [facility] today for f/u hospitalization 05/19/12-05/21/12 TIA with AMS and right sided weakness--resolved and MRI/MRA/Carotid study with negative workups. Symptoms resolved and ASA 81mg  started. Her gait remained unsteady and memory seems get worse.  Problem List Items Addressed This Visit     ICD-9-CM   Subdural hematoma     Has repeated CT head w/o CM 04/29/12 stated interval clearing of the left frontal subdural hematoma. Again MRI 05/19/12 while in hospital for evaluation of s/p fall and TIA: clearing of the previously MR detected left sided subdural hematoma.       Malignant hypertension     Controlled on Lisinopril 40mg  and Metoprolol 50mg        Diabetes mellitus     Previously on Metformin, will monitor fasting CBG and resume Metformin as indicated.     Dementia     Will obtain MMSE    Abnormality of gait     Therapy and staff reported the patient's poor balance, leaning to her side when she walks.  May consider holding Cymbalta for 2 weeks then re-evaluate her gait/balance/mood. Negative findings of MRI, MRI, and Carotid study while in hospital.       Hypertonicity of bladder     No c/o today, off Myrbetriq    Anxiety state, unspecified     Stable on Cymbalta 30mg --hold Cymbalta and monitor gait .      RESOLVED: TIA (transient ischemic attack) - Primary     ASA 81mg  started. MRI head 4/281/4 no intracranial hemorrhage(clearing of the previously MR detected left sided subdural hematoma). MRA 05/19/12 intracranial atherosclerotic type  changes. Carotid Doppler showed no stenosis. F/u Neurology. Her right sided weakness was resolved       Review of Systems:  Review of Systems  Constitutional: Negative for fever, chills, weight loss and malaise/fatigue.  HENT: Positive for hearing loss. Negative for ear pain, nosebleeds, congestion, sore throat and neck pain.   Eyes: Negative for pain, discharge and redness.  Respiratory: Negative for cough, sputum production and wheezing.   Cardiovascular: Negative for chest pain, palpitations, orthopnea, claudication, leg swelling and PND.  Gastrointestinal: Negative for heartburn, nausea, vomiting, diarrhea and constipation.  Genitourinary: Positive for frequency. Negative for dysuria and urgency.  Musculoskeletal: Positive for back pain, joint pain and falls. Negative for myalgias.  Skin: Negative for itching and rash.  Neurological: Positive for weakness (generalized. ). Negative for dizziness, tingling, tremors, speech change, focal weakness, seizures, loss of consciousness and headaches.       Unsteady gait.   Endo/Heme/Allergies: Negative for environmental allergies and polydipsia. Does not bruise/bleed easily.  Psychiatric/Behavioral: Positive for memory loss. Negative for depression and hallucinations. The patient does not have insomnia.      Past Medical History  Diagnosis Date  . Diabetes mellitus without complication   . Hypertension   . Melanoma   . Dementia    No past surgical history on file. Social History:   reports that she has never  smoked. She has never used smokeless tobacco. She reports that she does not drink alcohol or use illicit drugs.  Family History  Problem Relation Age of Onset  . Leukemia Father     Medications: [name] Medications  New Prescriptions   No medications on file  Previous Medications   ASPIRIN 81 MG CHEWABLE TABLET    Chew 81 mg by mouth daily.   DULOXETINE (CYMBALTA) 30 MG CAPSULE    Take 30 mg by mouth daily.    HYDROCODONE-ACETAMINOPHEN (NORCO/VICODIN) 5-325 MG PER TABLET    Take 1 tablet by mouth every 4 (four) hours as needed for pain (severe pain).   IBUPROFEN (ADVIL,MOTRIN) 200 MG TABLET    Take 200 mg by mouth every 6 (six) hours as needed. Pain   LISINOPRIL (PRINIVIL,ZESTRIL) 40 MG TABLET    Take 40 mg by mouth daily.   METOPROLOL SUCCINATE (TOPROL-XL) 25 MG 24 HR TABLET    Take 50 mg by mouth daily.    MULTIPLE VITAMINS-MINERALS (CERTAVITE/ANTIOXIDANTS PO)    Take 1 tablet by mouth daily.  Modified Medications   No medications on file  Discontinued Medications   No medications on file     Physical Exam: Physical Exam  Constitutional: She is oriented to person, place, and time. She appears well-developed and well-nourished.  HENT:  Head: Normocephalic and atraumatic.  Right Ear: External ear normal.  Left Ear: External ear normal.  Eyes: Conjunctivae and EOM are normal. Pupils are equal, round, and reactive to light.  Neck: Normal range of motion. Neck supple. No JVD present. No thyromegaly present.  Cardiovascular: Normal rate and normal heart sounds.   No murmur heard. Pulmonary/Chest: Effort normal. She has rales (left basilar ).  Abdominal: Soft. Bowel sounds are normal. There is no tenderness.  Musculoskeletal: She exhibits edema (right forefoot tendernss, redness, fever, swelling, no decreased ROM) and tenderness.  Lymphadenopathy:    She has no cervical adenopathy.  Neurological: She is alert and oriented to person, place, and time. She has normal reflexes. No cranial nerve deficit. She exhibits normal muscle tone. Coordination normal.  Skin: Skin is warm and dry. There is erythema (right forefoot).  Psychiatric: She has a normal mood and affect. Her behavior is normal. Judgment and thought content normal. Her speech is not delayed and not slurred. She is not agitated, not aggressive, not hyperactive, not slowed, not withdrawn, not actively hallucinating and not combative. Thought  content is not paranoid and not delusional. Cognition and memory are impaired. She exhibits abnormal recent memory.    Filed Vitals:   05/23/12 1030  BP: 148/68  Pulse: 67  Temp: 97.3 F (36.3 C)  TempSrc: Tympanic  Resp: 20  SpO2: 91%      Labs reviewed: Basic Metabolic Panel:  Recent Labs  16/10/96 0958 05/19/12 1509 05/19/12 1510 05/20/12 0550 05/21/12 1513  NA 134*  --   --  135 138  K 3.1*  --   --  3.8 3.0*  CL 93*  --   --  95* 102  CO2 28  --   --  27 27  GLUCOSE 247*  --   --  154* 116*  BUN 10  --   --  24* 18  CREATININE 0.53 0.61  --  1.29* 0.82  CALCIUM 10.1  --   --  9.3 9.2  MG  --   --  1.6  --   --   TSH  --  0.332*  --   --   --  Liver Function Tests:  Recent Labs  03/08/12 1420 05/19/12 0958  AST 21 21  ALT 14 15  ALKPHOS 83 90  BILITOT 0.5 0.5  PROT 7.5 7.9  ALBUMIN 4.2 4.3   No results found for this basename: LIPASE, AMYLASE,  in the last 8760 hours No results found for this basename: AMMONIA,  in the last 8760 hours CBC:  Recent Labs  03/08/12 1420  05/19/12 0958 05/19/12 1509 05/20/12 0550  WBC 5.6  < > 7.5 9.2 5.6  NEUTROABS 4.7  --  6.6  --   --   HGB 14.3  < > 14.8 14.7 12.7  HCT 41.4  < > 42.0 41.5 36.5  MCV 83.3  < > 79.5 79.7 79.9  PLT 179  < > 232 239 232  < > = values in this interval not displayed. Lipid Panel:  Recent Labs  05/20/12 0550  CHOL 191  HDL 38*  LDLCALC 118*  TRIG 174*  CHOLHDL 5.0   Anemia Panel:  Recent Labs  05/19/12 1509  VITAMINB12 751    Past Procedures:  05/19/12 CT head  IMPRESSION: No acute infarct.   05/19/12 MRI head  IMPRESSION: No acute intracranial abnormality.   Atrophy and evidence for chronic small vessel ischemic changes.  05/20/12 Echocardiogram   LV EF: 60% -   65%  05/13/12 CT head  IMPRESSION: Interval clearing of the left frontal subdural hematoma.    Assessment/Plan TIA (transient ischemic attack) ASA 81mg  started. MRI head 4/281/4 no  intracranial hemorrhage(clearing of the previously MR detected left sided subdural hematoma). MRA 05/19/12 intracranial atherosclerotic type changes. Carotid Doppler showed no stenosis. F/u Neurology. Her right sided weakness was resolved  Subdural hematoma Has repeated CT head w/o CM 04/29/12 stated interval clearing of the left frontal subdural hematoma. Again MRI 05/19/12 while in hospital for evaluation of s/p fall and TIA: clearing of the previously MR detected left sided subdural hematoma.     Malignant hypertension Controlled on Lisinopril 40mg  and Metoprolol 50mg      Diabetes mellitus Previously on Metformin, will monitor fasting CBG and resume Metformin as indicated.   Anxiety state, unspecified Stable on Cymbalta 30mg --hold Cymbalta and monitor gait .    Hypertonicity of bladder No c/o today, off Myrbetriq  Abnormality of gait Therapy and staff reported the patient's poor balance, leaning to her side when she walks.  May consider holding Cymbalta for 2 weeks then re-evaluate her gait/balance/mood. Negative findings of MRI, MRI, and Carotid study while in hospital.     Dementia Will obtain MMSE    Family/ Staff Communication:   Goals of Care: AL  Labs/tests ordered: none

## 2012-05-23 NOTE — Patient Instructions (Addendum)
Patient is at high risk for falls.    No further testing recommended.  Follow up with Primary Care provider.

## 2012-05-23 NOTE — Assessment & Plan Note (Signed)
Therapy and staff reported the patient's poor balance, leaning to her side when she walks.  May consider holding Cymbalta for 2 weeks then re-evaluate her gait/balance/mood. Negative findings of MRI, MRI, and Carotid study while in hospital.

## 2012-05-23 NOTE — Assessment & Plan Note (Signed)
Will obtain MMSE 

## 2012-05-23 NOTE — Assessment & Plan Note (Signed)
Has repeated CT head w/o CM 04/29/12 stated interval clearing of the left frontal subdural hematoma. Again MRI 05/19/12 while in hospital for evaluation of s/p fall and TIA: clearing of the previously MR detected left sided subdural hematoma.

## 2012-05-23 NOTE — Assessment & Plan Note (Signed)
Controlled on Lisinopril 40mg and Metoprolol 50mg            

## 2012-05-23 NOTE — Assessment & Plan Note (Signed)
3.0 05/21/12, will add Kcl daily, f/u BMP next lab.

## 2012-05-23 NOTE — Assessment & Plan Note (Signed)
No c/o today, off Myrbetriq   

## 2012-05-26 ENCOUNTER — Encounter: Payer: Self-pay | Admitting: Diagnostic Neuroimaging

## 2012-05-26 LAB — BASIC METABOLIC PANEL
BUN: 11 mg/dL (ref 4–21)
Creatinine: 0.7 mg/dL (ref 0.5–1.1)
Glucose: 118 mg/dL
Potassium: 3.9 mmol/L (ref 3.4–5.3)
Sodium: 139 mmol/L (ref 137–147)

## 2012-06-03 MED ORDER — ATORVASTATIN CALCIUM 20 MG PO TABS: 20.0000 mg | ORAL_TABLET | Freq: Every day | ORAL | Status: DC

## 2012-06-13 ENCOUNTER — Non-Acute Institutional Stay (SKILLED_NURSING_FACILITY): Payer: Medicare Other | Admitting: Nurse Practitioner

## 2012-06-13 DIAGNOSIS — J069 Acute upper respiratory infection, unspecified: Secondary | ICD-10-CM | POA: Insufficient documentation

## 2012-06-13 DIAGNOSIS — I10 Essential (primary) hypertension: Secondary | ICD-10-CM

## 2012-06-13 DIAGNOSIS — R269 Unspecified abnormalities of gait and mobility: Secondary | ICD-10-CM

## 2012-06-13 DIAGNOSIS — F329 Major depressive disorder, single episode, unspecified: Secondary | ICD-10-CM

## 2012-06-13 DIAGNOSIS — F0390 Unspecified dementia without behavioral disturbance: Secondary | ICD-10-CM

## 2012-06-13 DIAGNOSIS — N318 Other neuromuscular dysfunction of bladder: Secondary | ICD-10-CM

## 2012-06-13 DIAGNOSIS — E119 Type 2 diabetes mellitus without complications: Secondary | ICD-10-CM

## 2012-06-13 NOTE — Assessment & Plan Note (Signed)
improving. Negative findings of MRI, MRI, and Carotid study while in hospital.

## 2012-06-13 NOTE — Assessment & Plan Note (Signed)
No change in mood and her gait has been improved since Cymbalta on hold--dc Cymbalta.

## 2012-06-13 NOTE — Assessment & Plan Note (Signed)
Raspy voice and non productive cough, CXR 06/11/12 no evidence of infiltrate, nodular lesion, or pleural effusion. Augmentin 875mg  and Mucinex 600mg  bid for 7 days.

## 2012-06-13 NOTE — Assessment & Plan Note (Signed)
No c/o today, off Myrbetriq   

## 2012-06-13 NOTE — Assessment & Plan Note (Signed)
Controlled on Lisinopril 40mg and Metoprolol 50mg            

## 2012-06-13 NOTE — Assessment & Plan Note (Signed)
MMSE 22/30 05/29/12--Aricept started and tolerated.

## 2012-06-13 NOTE — Assessment & Plan Note (Signed)
Previously on Metformin, will monitor fasting CBG and resume Metformin as indicated.

## 2012-06-13 NOTE — Progress Notes (Signed)
Patient ID: Teresa Richards, female   DOB: 18-Aug-1918, 79 y.o.   MRN: [id number]  Chief Complaint:  Chief Complaint  Patient presents with  . Medical Managment of Chronic Issues    raspy voice and non productive cough     HPI:   Problem List Items Addressed This Visit   HTN (hypertension) - Primary (Chronic)     Controlled on Lisinopril 40mg  and Metoprolol 50mg          Depression (Chronic)     No change in mood and her gait has been improved since Cymbalta on hold--dc Cymbalta.         Diabetes mellitus     Previously on Metformin, will monitor fasting CBG and resume Metformin as indicated.       Dementia      MMSE 22/30 05/29/12--Aricept started and tolerated.       Abnormality of gait     improving. Negative findings of MRI, MRI, and Carotid study while in hospital.         Hypertonicity of bladder     No c/o today, off Myrbetriq      Acute upper respiratory infections of unspecified site     Raspy voice and non productive cough, CXR 06/11/12 no evidence of infiltrate, nodular lesion, or pleural effusion. Augmentin 875mg  and Mucinex 600mg  bid for 7 days.        Review of Systems:  Review of Systems  Constitutional: Negative for fever, chills, weight loss and malaise/fatigue.  HENT: Positive for hearing loss. Negative for ear pain, nosebleeds, congestion, sore throat and neck pain.   Eyes: Negative for pain, discharge and redness.  Respiratory: Positive for cough. Negative for sputum production and wheezing.   Cardiovascular: Negative for chest pain, palpitations, orthopnea, claudication, leg swelling and PND.  Gastrointestinal: Negative for heartburn, nausea, vomiting, diarrhea and constipation.  Genitourinary: Positive for frequency. Negative for dysuria and urgency.  Musculoskeletal: Positive for back pain, joint pain and falls. Negative for myalgias.  Skin: Negative for itching and rash.  Neurological: Positive for weakness (generalized. ). Negative for  dizziness, tingling, tremors, speech change, focal weakness, seizures, loss of consciousness and headaches.       Unsteady gait.   Endo/Heme/Allergies: Negative for environmental allergies and polydipsia. Does not bruise/bleed easily.  Psychiatric/Behavioral: Positive for memory loss. Negative for depression and hallucinations. The patient does not have insomnia.      Medications: [name] Medications  New Prescriptions   No medications on file  Previous Medications   ASPIRIN 81 MG CHEWABLE TABLET    Chew 81 mg by mouth daily.   ATORVASTATIN (LIPITOR) 20 MG TABLET    Take 1 tablet (20 mg total) by mouth daily.   DULOXETINE (CYMBALTA) 30 MG CAPSULE    Take 30 mg by mouth daily.   HYDROCODONE-ACETAMINOPHEN (NORCO/VICODIN) 5-325 MG PER TABLET    Take 1 tablet by mouth every 4 (four) hours as needed for pain (severe pain).   IBUPROFEN (ADVIL,MOTRIN) 200 MG TABLET    Take 200 mg by mouth every 6 (six) hours as needed. Pain   LISINOPRIL (PRINIVIL,ZESTRIL) 40 MG TABLET    Take 40 mg by mouth daily.   METOPROLOL SUCCINATE (TOPROL-XL) 25 MG 24 HR TABLET    Take 50 mg by mouth daily.    MULTIPLE VITAMINS-MINERALS (CERTAVITE/ANTIOXIDANTS PO)    Take 1 tablet by mouth daily.  Modified Medications   No medications on file  Discontinued Medications   No medications on file  Physical Exam: Physical Exam  Constitutional: She is oriented to person, place, and time. She appears well-developed and well-nourished.  HENT:  Head: Normocephalic and atraumatic.  Right Ear: External ear normal.  Left Ear: External ear normal.  Eyes: Conjunctivae and EOM are normal. Pupils are equal, round, and reactive to light.  Neck: Normal range of motion. Neck supple. No JVD present. No thyromegaly present.  Cardiovascular: Normal rate and normal heart sounds.   No murmur heard. Pulmonary/Chest: Effort normal. She has rales (left basilar ).  Abdominal: Soft. Bowel sounds are normal. There is no tenderness.   Musculoskeletal: She exhibits edema (right forefoot tendernss, redness, fever, swelling, no decreased ROM) and tenderness.  Lymphadenopathy:    She has no cervical adenopathy.  Neurological: She is alert and oriented to person, place, and time. She has normal reflexes. No cranial nerve deficit. She exhibits normal muscle tone. Coordination normal.  Skin: Skin is warm and dry. There is erythema (right forefoot).  Psychiatric: She has a normal mood and affect. Her behavior is normal. Judgment and thought content normal. Her speech is not delayed and not slurred. She is not agitated, not aggressive, not hyperactive, not slowed, not withdrawn, not actively hallucinating and not combative. Thought content is not paranoid and not delusional. Cognition and memory are impaired. She exhibits abnormal recent memory.     Filed Vitals:   06/13/12 1208  BP: 142/70  Pulse: 76  Temp: 99.1 F (37.3 C)  TempSrc: Tympanic  Resp: 18  SpO2: 96%      Labs reviewed: Basic Metabolic Panel:  Recent Labs  16/10/96 0958 05/19/12 1509 05/19/12 1510 05/20/12 0550 05/21/12 1513 05/26/12  NA 134*  --   --  135 138 139  K 3.1*  --   --  3.8 3.0* 3.9  CL 93*  --   --  95* 102  --   CO2 28  --   --  27 27  --   GLUCOSE 247*  --   --  154* 116*  --   BUN 10  --   --  24* 18 11  CREATININE 0.53 0.61  --  1.29* 0.82 0.7  CALCIUM 10.1  --   --  9.3 9.2  --   MG  --   --  1.6  --   --   --   TSH  --  0.332*  --   --   --   --     Liver Function Tests:  Recent Labs  03/08/12 1420 05/19/12 0958  AST 21 21  ALT 14 15  ALKPHOS 83 90  BILITOT 0.5 0.5  PROT 7.5 7.9  ALBUMIN 4.2 4.3    CBC:  Recent Labs  03/08/12 1420  05/19/12 0958 05/19/12 1509 05/20/12 0550  WBC 5.6  < > 7.5 9.2 5.6  NEUTROABS 4.7  --  6.6  --   --   HGB 14.3  < > 14.8 14.7 12.7  HCT 41.4  < > 42.0 41.5 36.5  MCV 83.3  < > 79.5 79.7 79.9  PLT 179  < > 232 239 232  < > = values in this interval not displayed.  Anemia  Panel:  Recent Labs  05/19/12 1509  VITAMINB12 751    Significant Diagnostic Results:   06/11/12 CXr no evidence of infiltrate, nodular lesion, or pleural effusion.   Assessment/Plan HTN (hypertension) Controlled on Lisinopril 40mg  and Metoprolol 50mg        Depression No change in mood  and her gait has been improved since Cymbalta on hold--dc Cymbalta.       Diabetes mellitus Previously on Metformin, will monitor fasting CBG and resume Metformin as indicated.     Abnormality of gait improving. Negative findings of MRI, MRI, and Carotid study while in hospital.       Hypertonicity of bladder No c/o today, off Myrbetriq    Dementia  MMSE 22/30 05/29/12--Aricept started and tolerated.     Acute upper respiratory infections of unspecified site Raspy voice and non productive cough, CXR 06/11/12 no evidence of infiltrate, nodular lesion, or pleural effusion. Augmentin 875mg  and Mucinex 600mg  bid for 7 days.       Family/ staff Communication: monitor the patient.    Goals of care: Al  Labs/tests ordered none.

## 2012-06-17 ENCOUNTER — Non-Acute Institutional Stay (SKILLED_NURSING_FACILITY): Payer: Medicare Other | Admitting: Nurse Practitioner

## 2012-06-17 DIAGNOSIS — F0390 Unspecified dementia without behavioral disturbance: Secondary | ICD-10-CM

## 2012-06-17 DIAGNOSIS — R269 Unspecified abnormalities of gait and mobility: Secondary | ICD-10-CM

## 2012-06-17 DIAGNOSIS — E119 Type 2 diabetes mellitus without complications: Secondary | ICD-10-CM

## 2012-06-17 DIAGNOSIS — I10 Essential (primary) hypertension: Secondary | ICD-10-CM

## 2012-06-17 NOTE — Progress Notes (Signed)
Patient ID: Teresa Richards, female   DOB: 06/15/1918, 79 y.o.   MRN: [id number]  Chief Complaint:  Chief Complaint  Patient presents with  . Medical Managment of Chronic Issues    blood sugar.      HPI:    Problem List Items Addressed This Visit   Diabetes mellitus - Primary (Chronic)     Previously on Metformin, fasting CBG 121-244--will resume Metformin 850mg  po daily, continue to monitor blood sugar.         HTN (hypertension) (Chronic)     Controlled on Lisinopril 40mg  and Metoprolol 50mg            Dementia      MMSE 22/30 05/29/12--Aricept started and tolerated.         Abnormality of gait     improving. Negative findings of MRI, MRI, and Carotid study while in hospital.              Review of Systems:  Review of Systems  Constitutional: Negative for fever, chills, weight loss and malaise/fatigue.  HENT: Positive for hearing loss. Negative for ear pain, nosebleeds, congestion, sore throat and neck pain.   Eyes: Negative for pain, discharge and redness.  Respiratory: Positive for cough. Negative for sputum production and wheezing.   Cardiovascular: Negative for chest pain, palpitations, orthopnea, claudication, leg swelling and PND.  Gastrointestinal: Negative for heartburn, nausea, vomiting, diarrhea and constipation.  Genitourinary: Positive for frequency. Negative for dysuria and urgency.  Musculoskeletal: Positive for back pain, joint pain and falls. Negative for myalgias.  Skin: Negative for itching and rash.  Neurological: Positive for weakness (generalized. ). Negative for dizziness, tingling, tremors, speech change, focal weakness, seizures, loss of consciousness and headaches.       Unsteady gait.   Endo/Heme/Allergies: Negative for environmental allergies and polydipsia. Does not bruise/bleed easily.  Psychiatric/Behavioral: Positive for memory loss. Negative for depression and hallucinations. The patient does not have insomnia.       Medications: [name] Medications  New Prescriptions   No medications on file  Previous Medications   ASPIRIN 81 MG CHEWABLE TABLET    Chew 81 mg by mouth daily.   ATORVASTATIN (LIPITOR) 20 MG TABLET    Take 1 tablet (20 mg total) by mouth daily.   DULOXETINE (CYMBALTA) 30 MG CAPSULE    Take 30 mg by mouth daily.   HYDROCODONE-ACETAMINOPHEN (NORCO/VICODIN) 5-325 MG PER TABLET    Take 1 tablet by mouth every 4 (four) hours as needed for pain (severe pain).   IBUPROFEN (ADVIL,MOTRIN) 200 MG TABLET    Take 200 mg by mouth every 6 (six) hours as needed. Pain   LISINOPRIL (PRINIVIL,ZESTRIL) 40 MG TABLET    Take 40 mg by mouth daily.   METOPROLOL SUCCINATE (TOPROL-XL) 25 MG 24 HR TABLET    Take 50 mg by mouth daily.    MULTIPLE VITAMINS-MINERALS (CERTAVITE/ANTIOXIDANTS PO)    Take 1 tablet by mouth daily.  Modified Medications   No medications on file  Discontinued Medications   No medications on file     Physical Exam: Physical Exam  Constitutional: She is oriented to person, place, and time. She appears well-developed and well-nourished.  HENT:  Head: Normocephalic and atraumatic.  Right Ear: External ear normal.  Left Ear: External ear normal.  Eyes: Conjunctivae and EOM are normal. Pupils are equal, round, and reactive to light.  Neck: Normal range of motion. Neck supple. No JVD present. No thyromegaly present.  Cardiovascular: Normal rate and normal heart sounds.  No murmur heard. Pulmonary/Chest: Effort normal. She has rales (left basilar ).  Abdominal: Soft. Bowel sounds are normal. There is no tenderness.  Musculoskeletal: She exhibits edema (right forefoot tendernss, redness, fever, swelling, no decreased ROM) and tenderness.  Lymphadenopathy:    She has no cervical adenopathy.  Neurological: She is alert and oriented to person, place, and time. She has normal reflexes. No cranial nerve deficit. She exhibits normal muscle tone. Coordination normal.  Skin: Skin is  warm and dry. There is erythema (right forefoot).  Psychiatric: She has a normal mood and affect. Her behavior is normal. Judgment and thought content normal. Her speech is not delayed and not slurred. She is not agitated, not aggressive, not hyperactive, not slowed, not withdrawn, not actively hallucinating and not combative. Thought content is not paranoid and not delusional. Cognition and memory are impaired. She exhibits abnormal recent memory.     Filed Vitals:   06/17/12 1233  BP: 142/88  Pulse: 74  Temp: 97.5 F (36.4 C)  TempSrc: Tympanic  Resp: 20  SpO2: 95%      Labs reviewed: Basic Metabolic Panel:  Recent Labs  30/86/57 0958 05/19/12 1509 05/19/12 1510 05/20/12 0550 05/21/12 1513 05/26/12  NA 134*  --   --  135 138 139  K 3.1*  --   --  3.8 3.0* 3.9  CL 93*  --   --  95* 102  --   CO2 28  --   --  27 27  --   GLUCOSE 247*  --   --  154* 116*  --   BUN 10  --   --  24* 18 11  CREATININE 0.53 0.61  --  1.29* 0.82 0.7  CALCIUM 10.1  --   --  9.3 9.2  --   MG  --   --  1.6  --   --   --   TSH  --  0.332*  --   --   --   --     Liver Function Tests:  Recent Labs  03/08/12 1420 05/19/12 0958  AST 21 21  ALT 14 15  ALKPHOS 83 90  BILITOT 0.5 0.5  PROT 7.5 7.9  ALBUMIN 4.2 4.3    CBC:  Recent Labs  03/08/12 1420  05/19/12 0958 05/19/12 1509 05/20/12 0550  WBC 5.6  < > 7.5 9.2 5.6  NEUTROABS 4.7  --  6.6  --   --   HGB 14.3  < > 14.8 14.7 12.7  HCT 41.4  < > 42.0 41.5 36.5  MCV 83.3  < > 79.5 79.7 79.9  PLT 179  < > 232 239 232  < > = values in this interval not displayed.  Anemia Panel:  Recent Labs  05/19/12 1509  VITAMINB12 751    Significant Diagnostic Results:     Assessment/Plan Diabetes mellitus Previously on Metformin, fasting CBG 121-244--will resume Metformin 850mg  po daily, continue to monitor blood sugar.       HTN (hypertension) Controlled on Lisinopril 40mg  and Metoprolol 50mg          Dementia  MMSE  22/30 05/29/12--Aricept started and tolerated.       Abnormality of gait improving. Negative findings of MRI, MRI, and Carotid study while in hospital.             Family/ staff Communication: observe the patient and closely monitor her blood sugar.    Goals of care: SNF for now and my be AL in  future.    Labs/tests ordered none

## 2012-06-17 NOTE — Assessment & Plan Note (Signed)
improving. Negative findings of MRI, MRI, and Carotid study while in hospital.      

## 2012-06-17 NOTE — Assessment & Plan Note (Signed)
Previously on Metformin, fasting CBG 121-244--will resume Metformin 850mg  po daily, continue to monitor blood sugar.

## 2012-06-17 NOTE — Assessment & Plan Note (Signed)
MMSE 22/30 05/29/12--Aricept started and tolerated.              

## 2012-06-17 NOTE — Assessment & Plan Note (Signed)
Controlled on Lisinopril 40mg  and Metoprolol 50mg 

## 2012-06-23 ENCOUNTER — Encounter: Payer: Self-pay | Admitting: Internal Medicine

## 2012-07-04 ENCOUNTER — Non-Acute Institutional Stay (SKILLED_NURSING_FACILITY): Payer: Medicare Other | Admitting: Nurse Practitioner

## 2012-07-04 DIAGNOSIS — R269 Unspecified abnormalities of gait and mobility: Secondary | ICD-10-CM

## 2012-07-04 DIAGNOSIS — I6350 Cerebral infarction due to unspecified occlusion or stenosis of unspecified cerebral artery: Secondary | ICD-10-CM

## 2012-07-04 DIAGNOSIS — Z66 Do not resuscitate: Secondary | ICD-10-CM

## 2012-07-04 DIAGNOSIS — E119 Type 2 diabetes mellitus without complications: Secondary | ICD-10-CM

## 2012-07-04 DIAGNOSIS — I10 Essential (primary) hypertension: Secondary | ICD-10-CM

## 2012-07-04 DIAGNOSIS — I639 Cerebral infarction, unspecified: Secondary | ICD-10-CM

## 2012-07-04 DIAGNOSIS — E876 Hypokalemia: Secondary | ICD-10-CM

## 2012-07-04 DIAGNOSIS — N318 Other neuromuscular dysfunction of bladder: Secondary | ICD-10-CM

## 2012-07-04 NOTE — Assessment & Plan Note (Signed)
MMSE 22/30 05/29/12--Aricept started and tolerated.              

## 2012-07-04 NOTE — Assessment & Plan Note (Signed)
improving. Negative findings of MRI, MRI, and Carotid study while in hospital.      

## 2012-07-04 NOTE — Assessment & Plan Note (Signed)
3.0 05/21/12, added Kcl daily, f/u BMP next lab.

## 2012-07-04 NOTE — Assessment & Plan Note (Signed)
Controlled on Lisinopril 40mg and Metoprolol 50mg            

## 2012-07-04 NOTE — Assessment & Plan Note (Signed)
On Metformin 850mg  po daily, continue to monitor blood sugar.

## 2012-07-04 NOTE — Assessment & Plan Note (Addendum)
stable °

## 2012-07-04 NOTE — Assessment & Plan Note (Signed)
No c/o today, off Myrbetriq   

## 2012-07-04 NOTE — Progress Notes (Signed)
Patient ID: Teresa Richards, female   DOB: [birth year], 79 y.o.   MRN: [id number]  Chief Complaint:  Chief Complaint  Patient presents with  . Medical Managment of Chronic Issues     HPI:    Problem List Items Addressed This Visit   Diabetes mellitus (Chronic)      On Metformin 850mg  po daily, continue to monitor blood sugar.           HTN (hypertension) (Chronic)     Controlled on Lisinopril 40mg  and Metoprolol 50mg              Abnormality of gait     improving. Negative findings of MRI, MRI, and Carotid study while in hospital.             Hypertonicity of bladder     No c/o today, off Myrbetriq        Stroke     stable            Hypokalemia     3.0 05/21/12, added Kcl daily, f/u BMP next lab.       DNR (do not resuscitate) - Primary      Review of Systems:  Review of Systems  Constitutional: Negative for fever, chills, weight loss and malaise/fatigue.  HENT: Positive for hearing loss. Negative for ear pain, nosebleeds, congestion, sore throat and neck pain.   Eyes: Negative for pain, discharge and redness.  Respiratory: Positive for cough. Negative for sputum production and wheezing.   Cardiovascular: Negative for chest pain, palpitations, orthopnea, claudication, leg swelling and PND.  Gastrointestinal: Negative for heartburn, nausea, vomiting, diarrhea and constipation.  Genitourinary: Positive for frequency. Negative for dysuria and urgency.  Musculoskeletal: Positive for back pain, joint pain and falls. Negative for myalgias.  Skin: Negative for itching and rash.  Neurological: Positive for weakness (generalized. ). Negative for dizziness, tingling, tremors, speech change, focal weakness, seizures, loss of consciousness and headaches.       Unsteady gait.   Endo/Heme/Allergies: Negative for environmental allergies and polydipsia. Does not bruise/bleed easily.  Psychiatric/Behavioral: Positive for memory loss. Negative for  depression and hallucinations. The patient does not have insomnia.      Medications: [name] Medications  New Prescriptions   No medications on file  Previous Medications   ASPIRIN 81 MG CHEWABLE TABLET    Chew 81 mg by mouth daily.   ATORVASTATIN (LIPITOR) 20 MG TABLET    Take 1 tablet (20 mg total) by mouth daily.   DULOXETINE (CYMBALTA) 30 MG CAPSULE    Take 30 mg by mouth daily.   HYDROCODONE-ACETAMINOPHEN (NORCO/VICODIN) 5-325 MG PER TABLET    Take 1 tablet by mouth every 4 (four) hours as needed for pain (severe pain).   IBUPROFEN (ADVIL,MOTRIN) 200 MG TABLET    Take 200 mg by mouth every 6 (six) hours as needed. Pain   LISINOPRIL (PRINIVIL,ZESTRIL) 40 MG TABLET    Take 40 mg by mouth daily.   METOPROLOL SUCCINATE (TOPROL-XL) 25 MG 24 HR TABLET    Take 50 mg by mouth daily.    MULTIPLE VITAMINS-MINERALS (CERTAVITE/ANTIOXIDANTS PO)    Take 1 tablet by mouth daily.  Modified Medications   No medications on file  Discontinued Medications   No medications on file     Physical Exam: Physical Exam  Constitutional: She is oriented to person, place, and time. She appears well-developed and well-nourished.  HENT:  Head: Normocephalic and atraumatic.  Right Ear: External ear normal.  Left Ear: External ear  normal.  Eyes: Conjunctivae and EOM are normal. Pupils are equal, round, and reactive to light.  Neck: Normal range of motion. Neck supple. No JVD present. No thyromegaly present.  Cardiovascular: Normal rate and normal heart sounds.   No murmur heard. Pulmonary/Chest: Effort normal. She has rales (left basilar ).  Abdominal: Soft. Bowel sounds are normal. There is no tenderness.  Musculoskeletal: She exhibits edema (right forefoot tendernss, redness, fever, swelling, no decreased ROM) and tenderness.  Lymphadenopathy:    She has no cervical adenopathy.  Neurological: She is alert and oriented to person, place, and time. She has normal reflexes. No cranial nerve deficit. She  exhibits normal muscle tone. Coordination normal.  Skin: Skin is warm and dry. There is erythema (right forefoot).  Psychiatric: She has a normal mood and affect. Her behavior is normal. Judgment and thought content normal. Her speech is not delayed and not slurred. She is not agitated, not aggressive, not hyperactive, not slowed, not withdrawn, not actively hallucinating and not combative. Thought content is not paranoid and not delusional. Cognition and memory are impaired. She exhibits abnormal recent memory.     Filed Vitals:   07/04/12 1648  BP: 132/70  Pulse: 70  Temp: 97.6 F (36.4 C)  TempSrc: Tympanic  Resp: 18      Labs reviewed: Basic Metabolic Panel:  Recent Labs  16/10/96 0958 05/19/12 1509 05/19/12 1510 05/20/12 0550 05/21/12 1513 05/26/12  NA 134*  --   --  135 138 139  K 3.1*  --   --  3.8 3.0* 3.9  CL 93*  --   --  95* 102  --   CO2 28  --   --  27 27  --   GLUCOSE 247*  --   --  154* 116*  --   BUN 10  --   --  24* 18 11  CREATININE 0.53 0.61  --  1.29* 0.82 0.7  CALCIUM 10.1  --   --  9.3 9.2  --   MG  --   --  1.6  --   --   --   TSH  --  0.332*  --   --   --   --     Liver Function Tests:  Recent Labs  03/08/12 1420 05/19/12 0958  AST 21 21  ALT 14 15  ALKPHOS 83 90  BILITOT 0.5 0.5  PROT 7.5 7.9  ALBUMIN 4.2 4.3    CBC:  Recent Labs  03/08/12 1420  05/19/12 0958 05/19/12 1509 05/20/12 0550  WBC 5.6  < > 7.5 9.2 5.6  NEUTROABS 4.7  --  6.6  --   --   HGB 14.3  < > 14.8 14.7 12.7  HCT 41.4  < > 42.0 41.5 36.5  MCV 83.3  < > 79.5 79.7 79.9  PLT 179  < > 232 239 232  < > = values in this interval not displayed.  Anemia Panel:  Recent Labs  05/19/12 1509  VITAMINB12 751    Significant Diagnostic Results:     Assessment/Plan Abnormality of gait improving. Negative findings of MRI, MRI, and Carotid study while in hospital.           Diabetes mellitus  On Metformin 850mg  po daily, continue to monitor blood  sugar.         Dementia  MMSE 22/30 05/29/12--Aricept started and tolerated.         Stroke stable  HTN (hypertension) Controlled on Lisinopril 40mg  and Metoprolol 50mg            Hypertonicity of bladder No c/o today, off Myrbetriq      Hypokalemia 3.0 05/21/12, added Kcl daily, f/u BMP next lab.         Family/ staff Communication: observe the patient.    Goals of care: SNF   Labs/tests ordered BMP

## 2012-07-07 LAB — BASIC METABOLIC PANEL: Sodium: 138 mmol/L (ref 137–147)

## 2012-08-12 ENCOUNTER — Non-Acute Institutional Stay (SKILLED_NURSING_FACILITY): Payer: Medicare Other | Admitting: Nurse Practitioner

## 2012-08-12 ENCOUNTER — Encounter: Payer: Self-pay | Admitting: Nurse Practitioner

## 2012-08-12 DIAGNOSIS — I10 Essential (primary) hypertension: Secondary | ICD-10-CM

## 2012-08-12 DIAGNOSIS — F0390 Unspecified dementia without behavioral disturbance: Secondary | ICD-10-CM

## 2012-08-12 DIAGNOSIS — E876 Hypokalemia: Secondary | ICD-10-CM

## 2012-08-12 DIAGNOSIS — E119 Type 2 diabetes mellitus without complications: Secondary | ICD-10-CM

## 2012-08-12 NOTE — Assessment & Plan Note (Signed)
On Metformin 850mg  po daily, continue to monitor blood sugar and update Hgb A1c

## 2012-08-12 NOTE — Assessment & Plan Note (Signed)
Controlled on Lisinopril 40mg  and Metoprolol 50mg 

## 2012-08-12 NOTE — Assessment & Plan Note (Signed)
3.0 05/21/12-4.2 07/07/12 since Kcl daily.

## 2012-08-12 NOTE — Assessment & Plan Note (Signed)
MMSE 22/30 05/29/12--Aricept started and tolerated.              

## 2012-08-12 NOTE — Progress Notes (Signed)
Patient ID: Teresa Richards, female   DOB: Feb 17, 1918, 79 y.o.   MRN: [id number] Code Status: DNR  Allergies  Allergen Reactions  . Codeine Other (See Comments)    Per [facility]    Chief Complaint  Patient presents with  . Medical Managment of Chronic Issues    blood sugar    HPI: Patient is a 79 y.o. female seen in the SNF at [facility] today for evaluation of her chronic medical conditions.  Problem List Items Addressed This Visit   Dementia      MMSE 22/30 05/29/12--Aricept started and tolerated.             Diabetes mellitus - Primary (Chronic)      On Metformin 850mg  po daily, continue to monitor blood sugar and update Hgb A1c            HTN (hypertension) (Chronic)     Controlled on Lisinopril 40mg  and Metoprolol 50mg                Hypokalemia     3.0 05/21/12-4.2 07/07/12 since Kcl daily.           Review of Systems:  Review of Systems  Constitutional: Negative for fever, chills, weight loss and malaise/fatigue.  HENT: Positive for hearing loss. Negative for ear pain, nosebleeds, congestion, sore throat and neck pain.   Eyes: Negative for pain, discharge and redness.  Respiratory: Negative for cough, sputum production and wheezing.   Cardiovascular: Negative for chest pain, palpitations, orthopnea, claudication, leg swelling and PND.  Gastrointestinal: Negative for heartburn, nausea, vomiting, diarrhea and constipation.  Genitourinary: Positive for frequency. Negative for dysuria and urgency.  Musculoskeletal: Positive for back pain. Negative for myalgias, joint pain and falls.  Skin: Negative for itching and rash.  Neurological: Negative for dizziness, tingling, tremors, speech change, focal weakness, seizures, loss of consciousness, weakness and headaches.       Unsteady gait.   Endo/Heme/Allergies: Negative for environmental allergies and polydipsia. Does not bruise/bleed easily.  Psychiatric/Behavioral: Positive for memory loss.  Negative for depression and hallucinations. The patient does not have insomnia.      Past Medical History  Diagnosis Date  . Diabetes mellitus without complication   . Hypertension   . Melanoma   . Dementia    History reviewed. No pertinent past surgical history. Social History:   reports that she has never smoked. She has never used smokeless tobacco. She reports that she does not drink alcohol or use illicit drugs.  Family History  Problem Relation Age of Onset  . Leukemia Father     Medications: Reviewed at [facility]   Physical Exam: Physical Exam  Constitutional: She is oriented to person, place, and time. She appears well-developed and well-nourished.  HENT:  Head: Normocephalic and atraumatic.  Right Ear: External ear normal.  Left Ear: External ear normal.  Eyes: Conjunctivae and EOM are normal. Pupils are equal, round, and reactive to light.  Neck: Normal range of motion. Neck supple. No JVD present. No thyromegaly present.  Cardiovascular: Normal rate and normal heart sounds.   No murmur heard. Pulmonary/Chest: Effort normal. She has rales (left basilar ).  Abdominal: Soft. Bowel sounds are normal. There is no tenderness.  Musculoskeletal: She exhibits no edema and no tenderness.  Lymphadenopathy:    She has no cervical adenopathy.  Neurological: She is alert and oriented to person, place, and time. She has normal reflexes. No cranial nerve deficit. She exhibits normal muscle tone. Coordination normal.  Skin: Skin is warm and dry. No rash noted. No erythema.  Psychiatric: She has a normal mood and affect. Her behavior is normal. Judgment and thought content normal. Her speech is not delayed and not slurred. She is not agitated, not aggressive, not hyperactive, not slowed, not withdrawn, not actively hallucinating and not combative. Thought content is not paranoid and not delusional. Cognition and memory are impaired. She exhibits abnormal recent memory.    Filed  Vitals:   08/12/12 1231  BP: 144/60  Pulse: 71  Temp: 98.1 F (36.7 C)  TempSrc: Tympanic  Resp: 16      Labs reviewed: Basic Metabolic Panel:  Recent Labs  47/82/95 0958 05/19/12 1509 05/19/12 1510 05/20/12 0550 05/21/12 1513 05/26/12 07/07/12  NA 134*  --   --  135 138 139 138  K 3.1*  --   --  3.8 3.0* 3.9 4.2  CL 93*  --   --  95* 102  --   --   CO2 28  --   --  27 27  --   --   GLUCOSE 247*  --   --  154* 116*  --   --   BUN 10  --   --  24* 18 11 12   CREATININE 0.53 0.61  --  1.29* 0.82 0.7 0.7  CALCIUM 10.1  --   --  9.3 9.2  --   --   MG  --   --  1.6  --   --   --   --   TSH  --  0.332*  --   --   --   --   --    Liver Function Tests:  Recent Labs  03/08/12 1420 05/19/12 0958  AST 21 21  ALT 14 15  ALKPHOS 83 90  BILITOT 0.5 0.5  PROT 7.5 7.9  ALBUMIN 4.2 4.3   No results found for this basename: LIPASE, AMYLASE,  in the last 8760 hours No results found for this basename: AMMONIA,  in the last 8760 hours CBC:  Recent Labs  03/08/12 1420  05/19/12 0958 05/19/12 1509 05/20/12 0550  WBC 5.6  < > 7.5 9.2 5.6  NEUTROABS 4.7  --  6.6  --   --   HGB 14.3  < > 14.8 14.7 12.7  HCT 41.4  < > 42.0 41.5 36.5  MCV 83.3  < > 79.5 79.7 79.9  PLT 179  < > 232 239 232  < > = values in this interval not displayed. Lipid Panel:  Recent Labs  05/20/12 0550  CHOL 191  HDL 38*  LDLCALC 118*  TRIG 174*  CHOLHDL 5.0   Anemia Panel:  Recent Labs  05/19/12 1509  VITAMINB12 751         Assessment/Plan Diabetes mellitus  On Metformin 850mg  po daily, continue to monitor blood sugar and update Hgb A1c          Dementia  MMSE 22/30 05/29/12--Aricept started and tolerated.           HTN (hypertension) Controlled on Lisinopril 40mg  and Metoprolol 50mg              Hypokalemia 3.0 05/21/12-4.2 07/07/12 since Kcl daily.        Family/ Staff: communication: observe the patient.   Goals of Care:  SNF  Labs/tests ordered: Hgb A1c

## 2012-08-14 LAB — HEMOGLOBIN A1C: Hgb A1c MFr Bld: 6.2 % — AB (ref 4.0–6.0)

## 2012-08-26 ENCOUNTER — Non-Acute Institutional Stay (SKILLED_NURSING_FACILITY): Payer: Medicare Other | Admitting: Nurse Practitioner

## 2012-08-26 DIAGNOSIS — I10 Essential (primary) hypertension: Secondary | ICD-10-CM

## 2012-08-26 DIAGNOSIS — E119 Type 2 diabetes mellitus without complications: Secondary | ICD-10-CM

## 2012-08-26 DIAGNOSIS — F0390 Unspecified dementia without behavioral disturbance: Secondary | ICD-10-CM

## 2012-08-26 DIAGNOSIS — R197 Diarrhea, unspecified: Secondary | ICD-10-CM

## 2012-08-26 DIAGNOSIS — E876 Hypokalemia: Secondary | ICD-10-CM

## 2012-08-26 NOTE — Assessment & Plan Note (Signed)
3.0 05/21/12, added Kcl daily, serum K 4.2 07/07/12

## 2012-08-26 NOTE — Assessment & Plan Note (Signed)
Controlled, Hgb A1c 6.2 08/14/12, dc  Metformin 850mg  po daily-experienced explosive loose stools associated with Metformin.  Try Glipizide 2.5mg  po daily. Monitor CBGs

## 2012-08-26 NOTE — Assessment & Plan Note (Signed)
Controlled on Lisinopril 40mg and Metoprolol 50mg            

## 2012-08-26 NOTE — Progress Notes (Signed)
Patient ID: Teresa Richards, female   DOB: 1918-09-01, 79 y.o.   MRN: [id number] Code Status: DNR  Allergies  Allergen Reactions  . Codeine Other (See Comments)    Per [facility]    Chief Complaint  Patient presents with  . Medical Managment of Chronic Issues    explosive loose stools     HPI: Patient is a 79 y.o. female seen in the SNF at [facility] today for evaluation of her explosive loose stools and other chronic medical conditions.  Problem List Items Addressed This Visit   Dementia      MMSE 22/30 05/29/12--Aricept started and tolerated.               Diabetes mellitus - Primary (Chronic)     Controlled, Hgb A1c 6.2 08/14/12, dc  Metformin 850mg  po daily-experienced explosive loose stools associated with Metformin.  Try Glipizide 2.5mg  po daily. Monitor CBGs             Diarrhea     Ashby Dawes is explosive loose stools--will check BMP, switch from Metformin-? Culprit to Glipizide for blood sugar control. Aricept may have contributed to her loose stools as well. Otherwise may check C-diff.     HTN (hypertension) (Chronic)     Controlled on Lisinopril 40mg  and Metoprolol 50mg                  Hypokalemia     3.0 05/21/12, added Kcl daily, serum K 4.2 07/07/12           Review of Systems: Review of Systems  Constitutional: Negative for fever, chills, weight loss and malaise/fatigue.  HENT: Positive for hearing loss. Negative for ear pain, nosebleeds, congestion, sore throat and neck pain.   Eyes: Negative for pain, discharge and redness.  Respiratory: Negative for cough, sputum production and wheezing.   Cardiovascular: Negative for chest pain, palpitations, orthopnea, claudication, leg swelling and PND.  Gastrointestinal: Negative for heartburn, nausea, vomiting, diarrhea and constipation.       Explosive loose stools.   Genitourinary: Positive for frequency. Negative for dysuria and urgency.  Musculoskeletal: Positive for back pain. Negative  for myalgias, joint pain and falls.  Skin: Negative for itching and rash.  Neurological: Negative for dizziness, tingling, tremors, speech change, focal weakness, seizures, loss of consciousness, weakness and headaches.       Unsteady gait.   Endo/Heme/Allergies: Negative for environmental allergies and polydipsia. Does not bruise/bleed easily.  Psychiatric/Behavioral: Positive for memory loss. Negative for depression and hallucinations. The patient does not have insomnia.      Past Medical History  Diagnosis Date  . Diabetes mellitus without complication   . Hypertension   . Melanoma   . Dementia    No past surgical history on file. Social History:   reports that she has never smoked. She has never used smokeless tobacco. She reports that she does not drink alcohol or use illicit drugs.  Family History  Problem Relation Age of Onset  . Leukemia Father     Medications: Reviewed at [facility]   Physical Exam: Physical Exam  Constitutional: She is oriented to person, place, and time. She appears well-developed and well-nourished.  HENT:  Head: Normocephalic and atraumatic.  Right Ear: External ear normal.  Left Ear: External ear normal.  Eyes: Conjunctivae and EOM are normal. Pupils are equal, round, and reactive to light.  Neck: Neck supple. No JVD present. No thyromegaly present.  Cardiovascular: Normal rate and normal heart sounds.   No  murmur heard. Pulmonary/Chest: Effort normal. She has rales (left basilar ).  Abdominal: Soft. Bowel sounds are normal. There is no tenderness.  Musculoskeletal: She exhibits no edema and no tenderness.  Lymphadenopathy:    She has no cervical adenopathy.  Neurological: She is alert and oriented to person, place, and time. She has normal reflexes. No cranial nerve deficit. She exhibits normal muscle tone. Coordination normal.  Skin: Skin is warm and dry. No rash noted. No erythema.  Psychiatric: She has a normal mood and affect. Her behavior  is normal. Judgment and thought content normal. Her speech is not delayed and not slurred. She is not agitated, not aggressive, not hyperactive, not slowed, not withdrawn, not actively hallucinating and not combative. Thought content is not paranoid and not delusional. Cognition and memory are impaired. She exhibits abnormal recent memory.    Filed Vitals:   08/26/12 1556  BP: 141/78  Pulse: 81  Temp: 98.7 F (37.1 C)  TempSrc: Tympanic  Resp: 21      Labs reviewed: Basic Metabolic Panel:  Recent Labs  16/10/96 0958 05/19/12 1509 05/19/12 1510 05/20/12 0550 05/21/12 1513 05/26/12 07/07/12  NA 134*  --   --  135 138 139 138  K 3.1*  --   --  3.8 3.0* 3.9 4.2  CL 93*  --   --  95* 102  --   --   CO2 28  --   --  27 27  --   --   GLUCOSE 247*  --   --  154* 116*  --   --   BUN 10  --   --  24* 18 11 12   CREATININE 0.53 0.61  --  1.29* 0.82 0.7 0.7  CALCIUM 10.1  --   --  9.3 9.2  --   --   MG  --   --  1.6  --   --   --   --   TSH  --  0.332*  --   --   --   --   --     Recent Labs  03/08/12 1420 05/19/12 0958  AST 21 21  ALT 14 15  ALKPHOS 83 90  BILITOT 0.5 0.5  PROT 7.5 7.9  ALBUMIN 4.2 4.3    CBC:  Recent Labs  03/08/12 1420  05/19/12 0958 05/19/12 1509 05/20/12 0550  WBC 5.6  < > 7.5 9.2 5.6  NEUTROABS 4.7  --  6.6  --   --   HGB 14.3  < > 14.8 14.7 12.7  HCT 41.4  < > 42.0 41.5 36.5  MCV 83.3  < > 79.5 79.7 79.9  PLT 179  < > 232 239 232  < > = values in this interval not displayed.   Recent Labs  05/20/12 0550  CHOL 191  HDL 38*  LDLCALC 118*  TRIG 174*  CHOLHDL 5.0   Anemia Panel:  Recent Labs  05/19/12 1509  VITAMINB12 751    Assessment/Plan Diabetes mellitus Controlled, Hgb A1c 6.2 08/14/12, dc  Metformin 850mg  po daily-experienced explosive loose stools associated with Metformin.  Try Glipizide 2.5mg  po daily. Monitor CBGs           Dementia  MMSE 22/30 05/29/12--Aricept started and tolerated.              HTN (hypertension) Controlled on Lisinopril 40mg  and Metoprolol 50mg                Hypokalemia 3.0 05/21/12, added Kcl  daily, serum K 4.2 07/07/12      Diarrhea Ashby Dawes is explosive loose stools--will check BMP, switch from Metformin-? Culprit to Glipizide for blood sugar control. Aricept may have contributed to her loose stools as well. Otherwise may check C-diff.     Family/ Staff Communication: observe the patient and monitor blood sugar  Goals of Care: SNF  Labs/tests ordered: none

## 2012-08-26 NOTE — Assessment & Plan Note (Signed)
MMSE 22/30 05/29/12--Aricept started and tolerated.              

## 2012-08-26 NOTE — Assessment & Plan Note (Signed)
Teresa Richards is explosive loose stools--will check BMP, switch from Metformin-? Culprit to Glipizide for blood sugar control. Aricept may have contributed to her loose stools as well. Otherwise may check C-diff.

## 2012-08-28 LAB — BASIC METABOLIC PANEL
BUN: 8 mg/dL (ref 4–21)
Creatinine: 0.6 mg/dL (ref 0.5–1.1)
Glucose: 89 mg/dL
Potassium: 4.2 mmol/L (ref 3.4–5.3)

## 2012-09-04 ENCOUNTER — Encounter: Payer: Self-pay | Admitting: Internal Medicine

## 2012-09-05 ENCOUNTER — Encounter: Payer: Self-pay | Admitting: Nurse Practitioner

## 2012-09-05 ENCOUNTER — Non-Acute Institutional Stay (SKILLED_NURSING_FACILITY): Payer: Medicare Other | Admitting: Nurse Practitioner

## 2012-09-05 DIAGNOSIS — F0390 Unspecified dementia without behavioral disturbance: Secondary | ICD-10-CM

## 2012-09-05 DIAGNOSIS — I10 Essential (primary) hypertension: Secondary | ICD-10-CM

## 2012-09-05 DIAGNOSIS — R269 Unspecified abnormalities of gait and mobility: Secondary | ICD-10-CM

## 2012-09-05 DIAGNOSIS — E876 Hypokalemia: Secondary | ICD-10-CM

## 2012-09-05 DIAGNOSIS — E119 Type 2 diabetes mellitus without complications: Secondary | ICD-10-CM

## 2012-09-05 DIAGNOSIS — I6350 Cerebral infarction due to unspecified occlusion or stenosis of unspecified cerebral artery: Secondary | ICD-10-CM

## 2012-09-05 DIAGNOSIS — I639 Cerebral infarction, unspecified: Secondary | ICD-10-CM

## 2012-09-05 NOTE — Assessment & Plan Note (Signed)
3.0 05/21/12, added Kcl daily, serum K 4.2 07/07/12 and 4.2 08/28/12--dc Kcl, update BMP in one week.

## 2012-09-05 NOTE — Assessment & Plan Note (Signed)
Elevated, continue Lisinopril 40mg  and increase Metoprolol succinate to 100mg -monitor Bp/P

## 2012-09-05 NOTE — Assessment & Plan Note (Signed)
stable °

## 2012-09-05 NOTE — Progress Notes (Signed)
Patient ID: Teresa Richards, female   DOB: 1918/08/04, 79 y.o.   MRN: [id number] Code Status: DNR  Allergies  Allergen Reactions  . Codeine Other (See Comments)    Per [facility]    Chief Complaint  Patient presents with  . Medical Managment of Chronic Issues    elevated Bps    HPI: Patient is a 79 y.o. female seen in the SNF at [facility] today for evaluation of elevated BPs and other chronic medical conditions.  Problem List Items Addressed This Visit   Abnormality of gait - Primary     improving. Negative findings of MRI, MRI, and Carotid study while in hospital. W/c for mobility.             Dementia      MMSE 22/30 05/29/12--Aricept started and tolerated.                 Relevant Medications      donepezil (ARICEPT) 10 MG tablet   Diabetes mellitus (Chronic)     Controlled, Hgb A1c 6.2 08/14/12, dc  Metformin 850mg  po daily-experienced explosive loose stools associated with Metformin.  Better with Glipizide 2.5mg  po daily. Monitor CBGs               Relevant Medications      aspirin 81 MG tablet   HTN (hypertension) (Chronic)     Elevated, continue Lisinopril 40mg  and increase Metoprolol succinate to 100mg -monitor Bp/P                  Relevant Medications      aspirin 81 MG tablet   Hypokalemia     3.0 05/21/12, added Kcl daily, serum K 4.2 07/07/12 and 4.2 08/28/12--dc Kcl, update BMP in one week.           Stroke     stable              Relevant Medications      aspirin 81 MG tablet      Review of Systems:  Review of Systems  Constitutional: Negative for fever, chills, weight loss and malaise/fatigue.  HENT: Positive for hearing loss. Negative for ear pain, nosebleeds, congestion, sore throat and neck pain.   Eyes: Negative for pain, discharge and redness.  Respiratory: Negative for cough, sputum production and wheezing.   Cardiovascular: Negative for chest pain, palpitations, orthopnea, claudication,  leg swelling and PND.  Gastrointestinal: Negative for heartburn, nausea, vomiting, diarrhea and constipation.       Explosive loose stools.   Genitourinary: Positive for frequency. Negative for dysuria and urgency.  Musculoskeletal: Positive for back pain. Negative for myalgias, joint pain and falls.  Skin: Negative for itching and rash.  Neurological: Negative for dizziness, tingling, tremors, speech change, focal weakness, seizures, loss of consciousness, weakness and headaches.       Unsteady gait.   Endo/Heme/Allergies: Negative for environmental allergies and polydipsia. Does not bruise/bleed easily.  Psychiatric/Behavioral: Positive for memory loss. Negative for depression and hallucinations. The patient does not have insomnia.      Past Medical History  Diagnosis Date  . Diabetes mellitus without complication   . Hypertension   . Melanoma   . Dementia    History reviewed. No pertinent past surgical history. Social History:   reports that she has never smoked. She has never used smokeless tobacco. She reports that she does not drink alcohol or use illicit drugs.  Family History  Problem Relation Age of Onset  .  Leukemia Father     Medications: [name] Medications  New Prescriptions   No medications on file  Previous Medications   ASPIRIN 81 MG TABLET    Take 81 mg by mouth daily.   DONEPEZIL (ARICEPT) 10 MG TABLET    Take 10 mg by mouth at bedtime as needed.   IBUPROFEN (ADVIL,MOTRIN) 200 MG TABLET    Take 200 mg by mouth every 6 (six) hours as needed. Pain   LISINOPRIL (PRINIVIL,ZESTRIL) 40 MG TABLET    Take 40 mg by mouth daily.   METOPROLOL SUCCINATE (TOPROL-XL) 25 MG 24 HR TABLET    Take 100 mg by mouth daily.    MULTIPLE VITAMINS-MINERALS (CERTAVITE/ANTIOXIDANTS PO)    Take 1 tablet by mouth daily.  Modified Medications   No medications on file  Discontinued Medications   ASPIRIN 81 MG CHEWABLE TABLET    Chew 81 mg by mouth daily.   ATORVASTATIN (LIPITOR) 20  MG TABLET    Take 1 tablet (20 mg total) by mouth daily.   DULOXETINE (CYMBALTA) 30 MG CAPSULE    Take 30 mg by mouth daily.   HYDROCODONE-ACETAMINOPHEN (NORCO/VICODIN) 5-325 MG PER TABLET    Take 1 tablet by mouth every 4 (four) hours as needed for pain (severe pain).     Physical Exam: Physical Exam  Constitutional: She is oriented to person, place, and time. She appears well-developed and well-nourished.  HENT:  Head: Normocephalic and atraumatic.  Right Ear: External ear normal.  Left Ear: External ear normal.  Eyes: Conjunctivae and EOM are normal. Pupils are equal, round, and reactive to light.  Neck: Neck supple. No JVD present. No thyromegaly present.  Cardiovascular: Normal rate.   Murmur heard.  Systolic murmur is present with a grade of 2/6  Pulmonary/Chest: Effort normal. She has rales (left basilar ).  Abdominal: Soft. Bowel sounds are normal. There is no tenderness.  Musculoskeletal: She exhibits no edema and no tenderness.  Lymphadenopathy:    She has no cervical adenopathy.  Neurological: She is alert and oriented to person, place, and time. She has normal reflexes. No cranial nerve deficit. She exhibits normal muscle tone. Coordination normal.  Skin: Skin is warm and dry. No rash noted. No erythema.  Psychiatric: She has a normal mood and affect. Her behavior is normal. Judgment and thought content normal. Her speech is not delayed and not slurred. She is not agitated, not aggressive, not hyperactive, not slowed, not withdrawn, not actively hallucinating and not combative. Thought content is not paranoid and not delusional. Cognition and memory are impaired. She exhibits abnormal recent memory.    Filed Vitals:   09/05/12 1407  BP: 165/90  Pulse: 65  Temp: 98.2 F (36.8 C)  TempSrc: Tympanic  Resp: 16      Labs reviewed: Basic Metabolic Panel:  Recent Labs  16/10/96 0958 05/19/12 1509 05/19/12 1510 05/20/12 0550 05/21/12 1513 05/26/12 07/07/12  08/28/12  NA 134*  --   --  135 138 139 138 140  K 3.1*  --   --  3.8 3.0* 3.9 4.2 4.2  CL 93*  --   --  95* 102  --   --   --   CO2 28  --   --  27 27  --   --   --   GLUCOSE 247*  --   --  154* 116*  --   --   --   BUN 10  --   --  24* 18 11 12  8  CREATININE 0.53 0.61  --  1.29* 0.82 0.7 0.7 0.6  CALCIUM 10.1  --   --  9.3 9.2  --   --   --   MG  --   --  1.6  --   --   --   --   --   TSH  --  0.332*  --   --   --   --   --   --    Liver Function Tests:  Recent Labs  03/08/12 1420 05/19/12 0958  AST 21 21  ALT 14 15  ALKPHOS 83 90  BILITOT 0.5 0.5  PROT 7.5 7.9  ALBUMIN 4.2 4.3   CBC:  Recent Labs  03/08/12 1420  05/19/12 0958 05/19/12 1509 05/20/12 0550  WBC 5.6  < > 7.5 9.2 5.6  NEUTROABS 4.7  --  6.6  --   --   HGB 14.3  < > 14.8 14.7 12.7  HCT 41.4  < > 42.0 41.5 36.5  MCV 83.3  < > 79.5 79.7 79.9  PLT 179  < > 232 239 232  < > = values in this interval not displayed. Lipid Panel:  Recent Labs  05/20/12 0550  CHOL 191  HDL 38*  LDLCALC 118*  TRIG 174*  CHOLHDL 5.0    Past Procedures:  05/19/12 MRI brain w/o CM  IMPRESSION:  No acute infarct   Assessment/Plan Abnormality of gait improving. Negative findings of MRI, MRI, and Carotid study while in hospital. W/c for mobility.           Stroke stable            Dementia  MMSE 22/30 05/29/12--Aricept started and tolerated.               Diabetes mellitus Controlled, Hgb A1c 6.2 08/14/12, dc  Metformin 850mg  po daily-experienced explosive loose stools associated with Metformin.  Better with Glipizide 2.5mg  po daily. Monitor CBGs             HTN (hypertension) Elevated, continue Lisinopril 40mg  and increase Metoprolol succinate to 100mg -monitor Bp/P                Hypokalemia 3.0 05/21/12, added Kcl daily, serum K 4.2 07/07/12 and 4.2 08/28/12--dc Kcl, update BMP in one week.           Family/ Staff Communication: observe the  patient.   Goals of Care: SNF  Labs/tests ordered:  BMP in one week.

## 2012-09-05 NOTE — Assessment & Plan Note (Signed)
MMSE 22/30 05/29/12--Aricept started and tolerated.

## 2012-09-05 NOTE — Assessment & Plan Note (Signed)
Controlled, Hgb A1c 6.2 08/14/12, dc  Metformin 850mg  po daily-experienced explosive loose stools associated with Metformin.  Better with Glipizide 2.5mg  po daily. Monitor CBGs

## 2012-09-05 NOTE — Assessment & Plan Note (Signed)
improving. Negative findings of MRI, MRI, and Carotid study while in hospital. W/c for mobility.

## 2012-09-11 LAB — BASIC METABOLIC PANEL
BUN: 8 mg/dL (ref 4–21)
Sodium: 139 mmol/L (ref 137–147)

## 2012-09-17 NOTE — Progress Notes (Signed)
  Subjective:    Patient ID: Teresa Richards, female    DOB: 1918-05-24, 79 y.o.   MRN: [id number]  HPI    Review of Systems     Objective:   Physical Exam   LAB REVIEW: 08/04/12 A1c 6.2   08/28/12 BMP normal      Assessment & Plan:   This encounter was created in error - please disregard.

## 2012-10-17 ENCOUNTER — Non-Acute Institutional Stay: Payer: Medicare Other | Admitting: Nurse Practitioner

## 2012-10-17 DIAGNOSIS — I6350 Cerebral infarction due to unspecified occlusion or stenosis of unspecified cerebral artery: Secondary | ICD-10-CM

## 2012-10-17 DIAGNOSIS — I10 Essential (primary) hypertension: Secondary | ICD-10-CM

## 2012-10-17 DIAGNOSIS — E119 Type 2 diabetes mellitus without complications: Secondary | ICD-10-CM

## 2012-10-17 DIAGNOSIS — I639 Cerebral infarction, unspecified: Secondary | ICD-10-CM

## 2012-10-17 DIAGNOSIS — F0390 Unspecified dementia without behavioral disturbance: Secondary | ICD-10-CM

## 2012-10-19 ENCOUNTER — Encounter: Payer: Self-pay | Admitting: Nurse Practitioner

## 2012-10-19 NOTE — Assessment & Plan Note (Addendum)
Elevated SBP, continue Lisinopril 40mg  and increased Metoprolol succinate to 100mg -monitor Bp/P

## 2012-10-19 NOTE — Assessment & Plan Note (Signed)
Controlled, Hgb A1c 6.2 08/14/12, dc  Metformin 850mg  po daily-experienced explosive loose stools associated with Metformin.  Better with Glipizide 2.5mg  po daily. Monitor CBGs-98 09/11/12

## 2012-10-19 NOTE — Assessment & Plan Note (Addendum)
Stable and no apparent focal neurological residual. 05/19/12  MRI brain Wo contrast: MIMPRESSION: No acute infarct. Small vessel disease type changes. Atrophy. No intracranial hemorrhage (clearing of the previously MR detected left-sided subdural hematoma).

## 2012-10-19 NOTE — Assessment & Plan Note (Signed)
MMSE 22/30 05/29/12--Aricept started and tolerated. Stabilized and able to return to AL

## 2012-10-19 NOTE — Progress Notes (Signed)
Patient ID: Teresa Richards, female   DOB: 1919-01-04, 79 y.o.   MRN: [id number] Code Status: DNR  Allergies  Allergen Reactions  . Codeine Other (See Comments)    Per [facility]    Chief Complaint  Patient presents with  . Medical Managment of Chronic Issues    "right ear canal bleed"    HPI: Patient is a 79 y.o. female seen in the AL at [facility] today for evaluation of "the right external ear canal bleed(was found dark brown color ear wax" and other chronic medical conditions.  Problem List Items Addressed This Visit   Dementia      MMSE 22/30 05/29/12--Aricept started and tolerated. Stabilized and able to return to AL                   Diabetes mellitus - Primary (Chronic)     Controlled, Hgb A1c 6.2 08/14/12, dc  Metformin 850mg  po daily-experienced explosive loose stools associated with Metformin.  Better with Glipizide 2.5mg  po daily. Monitor CBGs-98 09/11/12                 HTN (hypertension) (Chronic)     Elevated SBP, continue Lisinopril 40mg  and increased Metoprolol succinate to 100mg -monitor Bp/P                    Stroke     Stable and no apparent focal neurological residual. 05/19/12  MRI brain Wo contrast: MIMPRESSION: No acute infarct. Small vessel disease type changes. Atrophy. No intracranial hemorrhage (clearing of the previously MR detected left-sided subdural hematoma).                    Review of Systems:  Review of Systems  Constitutional: Negative for fever, chills, weight loss and malaise/fatigue.  HENT: Positive for hearing loss. Negative for ear pain, nosebleeds, congestion, sore throat, neck pain and ear discharge.        Staff reported "bleed" the right external ear canal.   Eyes: Negative for pain, discharge and redness.  Respiratory: Negative for cough, sputum production and wheezing.   Cardiovascular: Negative for chest pain, palpitations, orthopnea, claudication, leg swelling and PND.   Gastrointestinal: Negative for heartburn, nausea, vomiting, diarrhea and constipation.       Explosive loose stools resolved after Metformin discontinued.   Genitourinary: Positive for frequency. Negative for dysuria and urgency.  Musculoskeletal: Positive for back pain. Negative for myalgias, joint pain and falls.  Skin: Negative for itching and rash.  Neurological: Negative for dizziness, tingling, tremors, speech change, focal weakness, seizures, loss of consciousness, weakness and headaches.       Unsteady gait.   Endo/Heme/Allergies: Negative for environmental allergies and polydipsia. Does not bruise/bleed easily.  Psychiatric/Behavioral: Positive for memory loss. Negative for depression and hallucinations. The patient does not have insomnia.      Past Medical History  Diagnosis Date  . Diabetes mellitus without complication   . Hypertension   . Melanoma   . Dementia    Social History:   reports that she has never smoked. She has never used smokeless tobacco. She reports that she does not drink alcohol or use illicit drugs.  Family History  Problem Relation Age of Onset  . Leukemia Father     Medications: [name] Medications  New Prescriptions   No medications on file  Previous Medications   ASPIRIN 81 MG TABLET    Take 81 mg by mouth daily.   DONEPEZIL (ARICEPT) 10 MG TABLET  Take 10 mg by mouth at bedtime as needed.   IBUPROFEN (ADVIL,MOTRIN) 200 MG TABLET    Take 200 mg by mouth every 6 (six) hours as needed. Pain   LISINOPRIL (PRINIVIL,ZESTRIL) 40 MG TABLET    Take 40 mg by mouth daily.   METOPROLOL SUCCINATE (TOPROL-XL) 25 MG 24 HR TABLET    Take 100 mg by mouth daily.    MULTIPLE VITAMINS-MINERALS (CERTAVITE/ANTIOXIDANTS PO)    Take 1 tablet by mouth daily.  Modified Medications   No medications on file  Discontinued Medications   No medications on file     Physical Exam: Physical Exam  Constitutional: She is oriented to person, place, and time. She  appears well-developed and well-nourished.  HENT:  Head: Normocephalic and atraumatic.  Right Ear: External ear normal.  Left Ear: External ear normal.  The right external ear canal dark brown colored ear wax seen.   Eyes: Conjunctivae and EOM are normal. Pupils are equal, round, and reactive to light.  Neck: Neck supple. No JVD present. No thyromegaly present.  Cardiovascular: Normal rate.   Murmur heard.  Systolic murmur is present with a grade of 2/6  Pulmonary/Chest: Effort normal. She has rales (left basilar ).  Abdominal: Soft. Bowel sounds are normal. There is no tenderness.  Musculoskeletal: She exhibits no edema and no tenderness.  Lymphadenopathy:    She has no cervical adenopathy.  Neurological: She is alert and oriented to person, place, and time. She has normal reflexes. No cranial nerve deficit. She exhibits normal muscle tone. Coordination normal.  Skin: Skin is warm and dry. No rash noted. No erythema.  Psychiatric: She has a normal mood and affect. Her behavior is normal. Judgment and thought content normal. Her speech is not delayed and not slurred. She is not agitated, not aggressive, not hyperactive, not slowed, not withdrawn, not actively hallucinating and not combative. Thought content is not paranoid and not delusional. Cognition and memory are impaired. She exhibits abnormal recent memory.    Filed Vitals:   10/17/12 1453  BP: 170/80  Pulse: 72  Temp: 96.7 F (35.9 C)  TempSrc: Tympanic  Resp: 18   Labs reviewed: Basic Metabolic Panel:  Recent Labs  16/10/96 0958 05/19/12 1509 05/19/12 1510 05/20/12 0550 05/21/12 1513  07/07/12 08/28/12 09/11/12  NA 134*  --   --  135 138  < > 138 140 139  K 3.1*  --   --  3.8 3.0*  < > 4.2 4.2 3.6  CL 93*  --   --  95* 102  --   --   --   --   CO2 28  --   --  27 27  --   --   --   --   GLUCOSE 247*  --   --  154* 116*  --   --   --   --   BUN 10  --   --  24* 18  < > 12 8 8   CREATININE 0.53 0.61  --  1.29* 0.82   < > 0.7 0.6 0.7  CALCIUM 10.1  --   --  9.3 9.2  --   --   --   --   MG  --   --  1.6  --   --   --   --   --   --   TSH  --  0.332*  --   --   --   --   --   --   --   < > =  values in this interval not displayed. Liver Function Tests:  Recent Labs  03/08/12 1420 05/19/12 0958  AST 21 21  ALT 14 15  ALKPHOS 83 90  BILITOT 0.5 0.5  PROT 7.5 7.9  ALBUMIN 4.2 4.3   CBC:  Recent Labs  03/08/12 1420  05/19/12 0958 05/19/12 1509 05/20/12 0550  WBC 5.6  < > 7.5 9.2 5.6  NEUTROABS 4.7  --  6.6  --   --   HGB 14.3  < > 14.8 14.7 12.7  HCT 41.4  < > 42.0 41.5 36.5  MCV 83.3  < > 79.5 79.7 79.9  PLT 179  < > 232 239 232  < > = values in this interval not displayed. Lipid Panel:  Recent Labs  05/20/12 0550  CHOL 191  HDL 38*  LDLCALC 118*  TRIG 174*  CHOLHDL 5.0    Past Procedures:  03/08/12 MR brain Wo contrast:    IMPRESSION:   There is too much motion degradation to exclude a small cerebral  infarction. No large vessel cortical infarct is observed however.  5 mm thick left frontotemporal and interhemispheric subdural  hematoma appears acute and slightly increased in size when compared  with earlier CT. Because of the degree of atrophy and chronic  microvascular ischemic change, there is no significant mass effect  or midline shift.  05/19/12 MRI brain w/o CM  IMPRESSION:  No acute infarct   Assessment/Plan Diabetes mellitus Controlled, Hgb A1c 6.2 08/14/12, dc  Metformin 850mg  po daily-experienced explosive loose stools associated with Metformin.  Better with Glipizide 2.5mg  po daily. Monitor CBGs-98 09/11/12               Dementia  MMSE 22/30 05/29/12--Aricept started and tolerated. Stabilized and able to return to AL                 HTN (hypertension) Elevated SBP, continue Lisinopril 40mg  and increased Metoprolol succinate to 100mg -monitor Bp/P                  Stroke Stable and no apparent focal neurological  residual. 05/19/12  MRI brain Wo contrast: MIMPRESSION: No acute infarct. Small vessel disease type changes. Atrophy. No intracranial hemorrhage (clearing of the previously MR detected left-sided subdural hematoma).                 Family/ Staff Communication: observe the patient.   Goals of Care: AL  Labs/tests ordered:  none

## 2012-11-11 ENCOUNTER — Non-Acute Institutional Stay: Payer: Medicare Other | Admitting: Nurse Practitioner

## 2012-11-11 ENCOUNTER — Encounter: Payer: Self-pay | Admitting: Nurse Practitioner

## 2012-11-11 DIAGNOSIS — IMO0002 Reserved for concepts with insufficient information to code with codable children: Secondary | ICD-10-CM | POA: Insufficient documentation

## 2012-11-11 DIAGNOSIS — F0390 Unspecified dementia without behavioral disturbance: Secondary | ICD-10-CM

## 2012-11-11 DIAGNOSIS — E119 Type 2 diabetes mellitus without complications: Secondary | ICD-10-CM

## 2012-11-11 DIAGNOSIS — M545 Low back pain: Secondary | ICD-10-CM

## 2012-11-11 DIAGNOSIS — R269 Unspecified abnormalities of gait and mobility: Secondary | ICD-10-CM

## 2012-11-11 NOTE — Progress Notes (Signed)
Patient ID: Teresa Richards, female   DOB: January 14, 1919, 79 y.o.   MRN: [id number]  Code Status: DNR  Allergies  Allergen Reactions  . Codeine Other (See Comments)    Per [facility]    Chief Complaint  Patient presents with  . Medical Managment of Chronic Issues    lower back pain.   . Acute Visit    HPI: Patient is a 79 y.o. female seen in the AL at [facility] today for evaluation of lower back pain and other chronic medical conditions.  Problem List Items Addressed This Visit   Abnormality of gait - Primary     Worse since her lower back pain. Ambulates with walker for short distance and w/c to go further.     Dementia      MMSE 22/30 05/29/12--Aricept started and tolerated. Functioning well in AL                    Diabetes mellitus (Chronic)     Controlled, Hgb A1c 6.2 08/14/12, dc  Metformin 850mg  po daily-experienced explosive loose stools associated with Metformin.  Better with Glipizide 2.5mg  po daily. Monitor CBGs-98 09/11/12                   Lumbago     The patient stated it has been on and off in the past. Worsened in the past a week or two. Pain in her lower back is localized and aggravated with walking/weight bearing. Will obtain Lumbar spine X-ray. The patient desires Tylenol for pain for now. Will Schedule Tylenol 1000mg  tid. Observe the patient. Unremarkable urine culture 11/03/12       Review of Systems:  Review of Systems  Constitutional: Negative for fever, chills, weight loss and malaise/fatigue.  HENT: Positive for hearing loss. Negative for congestion, ear discharge, ear pain, nosebleeds and sore throat.   Eyes: Negative for pain, discharge and redness.  Respiratory: Negative for cough, sputum production and wheezing.   Cardiovascular: Negative for chest pain, palpitations, orthopnea, claudication, leg swelling and PND.  Gastrointestinal: Negative for heartburn, nausea, vomiting, diarrhea and constipation.       Explosive loose  stools resolved after Metformin discontinued.   Genitourinary: Positive for frequency. Negative for dysuria and urgency.  Musculoskeletal: Positive for back pain. Negative for falls, joint pain, myalgias and neck pain.       Lower back pain worse-rides w/c more for mobility.   Skin: Negative for itching and rash.  Neurological: Negative for dizziness, tingling, tremors, speech change, focal weakness, seizures, loss of consciousness, weakness and headaches.       Unsteady gait.   Endo/Heme/Allergies: Negative for environmental allergies and polydipsia. Does not bruise/bleed easily.  Psychiatric/Behavioral: Positive for memory loss. Negative for depression and hallucinations. The patient does not have insomnia.      Past Medical History  Diagnosis Date  . Diabetes mellitus without complication   . Hypertension   . Melanoma   . Dementia    Social History:   reports that she has never smoked. She has never used smokeless tobacco. She reports that she does not drink alcohol or use illicit drugs.  Family History  Problem Relation Age of Onset  . Leukemia Father     Medications: [name] Medications  New Prescriptions   No medications on file  Previous Medications   ASPIRIN 81 MG TABLET    Take 81 mg by mouth daily.   DONEPEZIL (ARICEPT) 10 MG TABLET    Take 10  mg by mouth at bedtime as needed.   IBUPROFEN (ADVIL,MOTRIN) 200 MG TABLET    Take 200 mg by mouth every 6 (six) hours as needed. Pain   LISINOPRIL (PRINIVIL,ZESTRIL) 40 MG TABLET    Take 40 mg by mouth daily.   METOPROLOL SUCCINATE (TOPROL-XL) 25 MG 24 HR TABLET    Take 100 mg by mouth daily.    MULTIPLE VITAMINS-MINERALS (CERTAVITE/ANTIOXIDANTS PO)    Take 1 tablet by mouth daily.  Modified Medications   No medications on file  Discontinued Medications   No medications on file     Physical Exam: Physical Exam  Constitutional: She is oriented to person, place, and time. She appears well-developed and well-nourished.   HENT:  Head: Normocephalic and atraumatic.  Right Ear: External ear normal.  Left Ear: External ear normal.  Eyes: Conjunctivae and EOM are normal. Pupils are equal, round, and reactive to light.  Neck: Neck supple. No JVD present. No thyromegaly present.  Cardiovascular: Normal rate.   Murmur heard.  Systolic murmur is present with a grade of 2/6  Pulmonary/Chest: Effort normal. She has rales (left basilar ).  Abdominal: Soft. Bowel sounds are normal. There is no tenderness.  Musculoskeletal: She exhibits tenderness. She exhibits no edema.  Lower back, localized, aggravated with weight bearing, positional.   Lymphadenopathy:    She has no cervical adenopathy.  Neurological: She is alert and oriented to person, place, and time. She has normal reflexes. No cranial nerve deficit. She exhibits normal muscle tone. Coordination normal.  Skin: Skin is warm and dry. No rash noted. No erythema.  Psychiatric: She has a normal mood and affect. Her behavior is normal. Judgment and thought content normal. Her speech is not delayed and not slurred. She is not agitated, not aggressive, not hyperactive, not slowed, not withdrawn, not actively hallucinating and not combative. Thought content is not paranoid and not delusional. Cognition and memory are impaired. She exhibits abnormal recent memory.    Filed Vitals:   11/11/12 1219  BP: 190/76  Pulse: 58  Temp: 97.2 F (36.2 C)  TempSrc: Tympanic  Resp: 18   Labs reviewed: Basic Metabolic Panel:  Recent Labs  16/10/96 0958 05/19/12 1509 05/19/12 1510 05/20/12 0550 05/21/12 1513  07/07/12 08/28/12 09/11/12  NA 134*  --   --  135 138  < > 138 140 139  K 3.1*  --   --  3.8 3.0*  < > 4.2 4.2 3.6  CL 93*  --   --  95* 102  --   --   --   --   CO2 28  --   --  27 27  --   --   --   --   GLUCOSE 247*  --   --  154* 116*  --   --   --   --   BUN 10  --   --  24* 18  < > 12 8 8   CREATININE 0.53 0.61  --  1.29* 0.82  < > 0.7 0.6 0.7  CALCIUM 10.1   --   --  9.3 9.2  --   --   --   --   MG  --   --  1.6  --   --   --   --   --   --   TSH  --  0.332*  --   --   --   --   --   --   --   < > =  values in this interval not displayed. Liver Function Tests:  Recent Labs  03/08/12 1420 05/19/12 0958  AST 21 21  ALT 14 15  ALKPHOS 83 90  BILITOT 0.5 0.5  PROT 7.5 7.9  ALBUMIN 4.2 4.3   CBC:  Recent Labs  03/08/12 1420  05/19/12 0958 05/19/12 1509 05/20/12 0550  WBC 5.6  < > 7.5 9.2 5.6  NEUTROABS 4.7  --  6.6  --   --   HGB 14.3  < > 14.8 14.7 12.7  HCT 41.4  < > 42.0 41.5 36.5  MCV 83.3  < > 79.5 79.7 79.9  PLT 179  < > 232 239 232  < > = values in this interval not displayed. Lipid Panel:  Recent Labs  05/20/12 0550  CHOL 191  HDL 38*  LDLCALC 118*  TRIG 174*  CHOLHDL 5.0    Past Procedures:  03/08/12 MR brain Wo contrast:    IMPRESSION:   There is too much motion degradation to exclude a small cerebral  infarction. No large vessel cortical infarct is observed however.  5 mm thick left frontotemporal and interhemispheric subdural  hematoma appears acute and slightly increased in size when compared  with earlier CT. Because of the degree of atrophy and chronic  microvascular ischemic change, there is no significant mass effect  or midline shift.  05/19/12 MRI brain w/o CM  IMPRESSION:  No acute infarct   Assessment/Plan Abnormality of gait Worse since her lower back pain. Ambulates with walker for short distance and w/c to go further.   Dementia  MMSE 22/30 05/29/12--Aricept started and tolerated. Functioning well in AL                  Diabetes mellitus Controlled, Hgb A1c 6.2 08/14/12, dc  Metformin 850mg  po daily-experienced explosive loose stools associated with Metformin.  Better with Glipizide 2.5mg  po daily. Monitor CBGs-98 09/11/12                 Lumbago The patient stated it has been on and off in the past. Worsened in the past a week or two. Pain in her lower  back is localized and aggravated with walking/weight bearing. Will obtain Lumbar spine X-ray. The patient desires Tylenol for pain for now. Will Schedule Tylenol 1000mg  tid. Observe the patient. Unremarkable urine culture 11/03/12    Family/ Staff Communication: observe the patient.   Goals of Care: AL  Labs/tests ordered:  Lumbar X-ray.

## 2012-11-11 NOTE — Assessment & Plan Note (Signed)
The patient stated it has been on and off in the past. Worsened in the past a week or two. Pain in her lower back is localized and aggravated with walking/weight bearing. Will obtain Lumbar spine X-ray. The patient desires Tylenol for pain for now. Will Schedule Tylenol 1000mg  tid. Observe the patient. Unremarkable urine culture 11/03/12

## 2012-11-11 NOTE — Assessment & Plan Note (Signed)
Worse since her lower back pain. Ambulates with walker for short distance and w/c to go further.

## 2012-11-11 NOTE — Assessment & Plan Note (Signed)
Controlled, Hgb A1c 6.2 08/14/12, dc  Metformin 850mg  po daily-experienced explosive loose stools associated with Metformin.  Better with Glipizide 2.5mg  po daily. Monitor CBGs-98 09/11/12

## 2012-11-11 NOTE — Assessment & Plan Note (Signed)
MMSE 22/30 05/29/12--Aricept started and tolerated. Functioning well in AL   

## 2013-01-02 ENCOUNTER — Emergency Department (HOSPITAL_COMMUNITY): Payer: Medicare Other

## 2013-01-02 ENCOUNTER — Encounter (HOSPITAL_COMMUNITY): Payer: Self-pay | Admitting: Emergency Medicine

## 2013-01-02 ENCOUNTER — Emergency Department (HOSPITAL_COMMUNITY)
Admission: EM | Admit: 2013-01-02 | Discharge: 2013-01-02 | Disposition: A | Discharge status: Home / Self Care | Payer: Medicare Other | Attending: Emergency Medicine | Admitting: Emergency Medicine

## 2013-01-02 DIAGNOSIS — Y9389 Activity, other specified: Secondary | ICD-10-CM | POA: Insufficient documentation

## 2013-01-02 DIAGNOSIS — R0902 Hypoxemia: Secondary | ICD-10-CM | POA: Insufficient documentation

## 2013-01-02 DIAGNOSIS — W19XXXA Unspecified fall, initial encounter: Secondary | ICD-10-CM

## 2013-01-02 DIAGNOSIS — E119 Type 2 diabetes mellitus without complications: Secondary | ICD-10-CM | POA: Diagnosis not present

## 2013-01-02 DIAGNOSIS — Z7982 Long term (current) use of aspirin: Secondary | ICD-10-CM | POA: Insufficient documentation

## 2013-01-02 DIAGNOSIS — I10 Essential (primary) hypertension: Secondary | ICD-10-CM | POA: Insufficient documentation

## 2013-01-02 DIAGNOSIS — Z85820 Personal history of malignant melanoma of skin: Secondary | ICD-10-CM | POA: Insufficient documentation

## 2013-01-02 DIAGNOSIS — Y9210 Unspecified residential institution as the place of occurrence of the external cause: Secondary | ICD-10-CM | POA: Insufficient documentation

## 2013-01-02 DIAGNOSIS — Z79899 Other long term (current) drug therapy: Secondary | ICD-10-CM | POA: Insufficient documentation

## 2013-01-02 DIAGNOSIS — IMO0002 Reserved for concepts with insufficient information to code with codable children: Secondary | ICD-10-CM

## 2013-01-02 DIAGNOSIS — F0390 Unspecified dementia without behavioral disturbance: Secondary | ICD-10-CM | POA: Insufficient documentation

## 2013-01-02 DIAGNOSIS — W1809XA Striking against other object with subsequent fall, initial encounter: Secondary | ICD-10-CM | POA: Insufficient documentation

## 2013-01-02 DIAGNOSIS — S0180XA Unspecified open wound of other part of head, initial encounter: Secondary | ICD-10-CM | POA: Insufficient documentation

## 2013-01-02 DIAGNOSIS — W07XXXA Fall from chair, initial encounter: Secondary | ICD-10-CM | POA: Insufficient documentation

## 2013-01-02 LAB — COMPREHENSIVE METABOLIC PANEL
AST: 18 U/L (ref 0–37)
Albumin: 3.5 g/dL (ref 3.5–5.2)
BUN: 15 mg/dL (ref 6–23)
Calcium: 9.4 mg/dL (ref 8.4–10.5)
Creatinine, Ser: 0.83 mg/dL (ref 0.50–1.10)
Total Protein: 6.5 g/dL (ref 6.0–8.3)

## 2013-01-02 LAB — BLOOD GAS, ARTERIAL
Acid-Base Excess: 4.6 mmol/L — ABNORMAL HIGH (ref 0.0–2.0)
Drawn by: 235321
TCO2: 25.9 mmol/L (ref 0–100)
pCO2 arterial: 42.8 mmHg (ref 35.0–45.0)
pH, Arterial: 7.442 (ref 7.350–7.450)
pO2, Arterial: 55.1 mmHg — ABNORMAL LOW (ref 80.0–100.0)

## 2013-01-02 LAB — CBC
HCT: 35.9 % — ABNORMAL LOW (ref 36.0–46.0)
RBC: 4.35 MIL/uL (ref 3.87–5.11)
RDW: 14 % (ref 11.5–15.5)
WBC: 3.2 10*3/uL — ABNORMAL LOW (ref 4.0–10.5)

## 2013-01-02 LAB — POCT I-STAT, CHEM 8
BUN: 18 mg/dL (ref 6–23)
Chloride: 97 mEq/L (ref 96–112)
Creatinine, Ser: 0.9 mg/dL (ref 0.50–1.10)
Glucose, Bld: 156 mg/dL — ABNORMAL HIGH (ref 70–99)
HCT: 37 % (ref 36.0–46.0)
Potassium: 3.6 mEq/L (ref 3.5–5.1)
Sodium: 137 mEq/L (ref 135–145)

## 2013-01-02 MED ORDER — POTASSIUM CHLORIDE CRYS ER 20 MEQ PO TBCR: 40.0000 meq | EXTENDED_RELEASE_TABLET | Freq: Once | ORAL | Status: DC

## 2013-01-02 MED ORDER — TETANUS-DIPHTH-ACELL PERTUSSIS 5-2.5-18.5 LF-MCG/0.5 IM SUSP
0.5000 mL | Freq: Once | INTRAMUSCULAR | Status: DC
  Filled 2013-01-02: qty 0.5

## 2013-01-02 MED ORDER — POTASSIUM CHLORIDE 20 MEQ/15ML (10%) PO LIQD
40.0000 meq | Freq: Once | ORAL | Status: AC
  Administered 2013-01-02: 40 meq via ORAL
  Filled 2013-01-02: qty 30

## 2013-01-02 NOTE — ED Notes (Signed)
Tdap injection cancelled at this time. Pt's daughter at bedside said that she believes that her tetanus shot is up to date since patient has had some falls and sutures in the past several years.

## 2013-01-02 NOTE — ED Provider Notes (Signed)
CSN: [id number]     Arrival date & time 01/02/13  0055 History   First MD Initiated Contact with Patient 01/02/13 0113     Chief Complaint  Patient presents with  . Fall  . Head Laceration   (Consider location/radiation/quality/duration/timing/severity/associated sxs/prior Treatment) HPI Level V caveat applies for dementia  Hx provided by EMS and family bedside. Lives at assisted-living facility, reportedly got out of her chair and fell forward striking her head and sustained a laceration. Bleeding controlled prior to arrival. Patient is not a reliable historian, unknown LOC. Patient denies any other pain or injury. On arrival to emergency department was noted to have pulse ox in the 80s. She denies any cough or trouble breathing. Daughter bedside states with previous fall and head bleed had similar presentation. Currently baseline mentation. Past Medical History  Diagnosis Date  . Diabetes mellitus without complication   . Hypertension   . Melanoma   . Dementia    History reviewed. No pertinent past surgical history. Family History  Problem Relation Age of Onset  . Leukemia Father    History  Substance Use Topics  . Smoking status: Never Smoker   . Smokeless tobacco: Never Used  . Alcohol Use: No     Comment: Patient drinks coffee occasional.   OB History   Grav Para Term Preterm Abortions TAB SAB Ect Mult Living                 Review of Systems  Unable to perform ROS  level V caveat as above  Allergies  Codeine  Home Medications   Current Outpatient Rx  Name  Route  Sig  Dispense  Refill  . aspirin 81 MG tablet   Oral   Take 81 mg by mouth daily.         Marland Kitchen donepezil (ARICEPT) 10 MG tablet   Oral   Take 10 mg by mouth at bedtime as needed.         Marland Kitchen ibuprofen (ADVIL,MOTRIN) 200 MG tablet   Oral   Take 200 mg by mouth every 6 (six) hours as needed. Pain         . lisinopril (PRINIVIL,ZESTRIL) 40 MG tablet   Oral   Take 40 mg by mouth daily.          . metoprolol succinate (TOPROL-XL) 25 MG 24 hr tablet   Oral   Take 100 mg by mouth daily.          . Multiple Vitamins-Minerals (CERTAVITE/ANTIOXIDANTS PO)   Oral   Take 1 tablet by mouth daily.          BP 148/64  Pulse 77  Temp(Src) 98 F (36.7 C) (Oral)  Resp 16  SpO2 94% Physical Exam  Constitutional: She appears well-developed and well-nourished.  HENT:  Head: Normocephalic.  4 cm irregular and jagged laceration to mid forehead. No active bleeding. No underlying bony tenderness or deformity. No epistaxis. No trismus.   Eyes: EOM are normal. Pupils are equal, round, and reactive to light.  Neck: Neck supple.  Cardiovascular: Normal rate, regular rhythm and intact distal pulses.   Pulmonary/Chest: Effort normal and breath sounds normal. No respiratory distress. She has no wheezes. She has no rales. She exhibits no tenderness.  Abdominal: Soft. She exhibits no distension. There is no tenderness.  Musculoskeletal: Normal range of motion. She exhibits no tenderness.  Neurological: She is alert. No cranial nerve deficit. Coordination normal.  Oriented to self and place  Skin: Skin is  warm and dry.    ED Course  LACERATION REPAIR Date/Time: 01/02/2013 6:23 AM Performed by: Sunnie Nielsen Authorized by: Sunnie Nielsen Consent: Verbal consent obtained. Risks and benefits: risks, benefits and alternatives were discussed Consent given by: patient Patient understanding: patient states understanding of the procedure being performed Patient consent: the patient's understanding of the procedure matches consent given Procedure consent: procedure consent matches procedure scheduled Required items: required blood products, implants, devices, and special equipment available Patient identity confirmed: verbally with patient Time out: Immediately prior to procedure a "time out" was called to verify the correct patient, procedure, equipment, support staff and site/side marked as  required. Body area: head/neck Location details: forehead Laceration length: 4 cm Foreign bodies: no foreign bodies Anesthesia: local infiltration Local anesthetic: lidocaine 1% without epinephrine Anesthetic total: 4 ml Preparation: Patient was prepped and draped in the usual sterile fashion. Irrigation solution: saline Irrigation method: syringe Amount of cleaning: standard Skin closure: 5-0 Prolene Number of sutures: 6 Technique: simple Approximation: close Approximation difficulty: simple Dressing: antibiotic ointment and gauze roll Patient tolerance: Patient tolerated the procedure well with no immediate complications.   (including critical care time) Labs Review Labs Reviewed  CBC - Abnormal; Notable for the following:    WBC 3.2 (*)    HCT 35.9 (*)    Platelets 94 (*)    All other components within normal limits  COMPREHENSIVE METABOLIC PANEL - Abnormal; Notable for the following:    Potassium 3.2 (*)    Glucose, Bld 162 (*)    GFR calc non Af Amer 59 (*)    GFR calc Af Amer 69 (*)    All other components within normal limits  BLOOD GAS, ARTERIAL - Abnormal; Notable for the following:    pO2, Arterial 55.1 (*)    Bicarbonate 28.7 (*)    Acid-Base Excess 4.6 (*)    All other components within normal limits  POCT I-STAT, CHEM 8 - Abnormal; Notable for the following:    Glucose, Bld 156 (*)    All other components within normal limits  URINALYSIS, ROUTINE W REFLEX MICROSCOPIC   Imaging Review Dg Chest 2 View  01/02/2013   CLINICAL DATA:  Fall  EXAM: CHEST  2 VIEW  COMPARISON:  Prior radiograph from 05/19/2012  FINDINGS: The cardiac and mediastinal silhouettes are stable in size and contour, and remain within normal limits.  The lungs are hypoinflated. Associated mild bibasilar atelectasis is present. Oblique linear opacity within the left perihilar region is also most consistent with probable atelectasis. No airspace consolidation, pleural effusion, or pulmonary  edema is identified. There is no pneumothorax.  Diffuse osteopenia again noted. Multilevel degenerative disc disease within the visualized spine is present. No acute osseous abnormality identified.  IMPRESSION: Hypoinflation with mild bibasilar atelectasis. No acute cardiopulmonary process identified.   Electronically Signed   By: Rise Mu M.D.   On: 01/02/2013 02:16   Ct Head Wo Contrast  01/02/2013   CLINICAL DATA:  Status post fall; hit head on floor. Laceration to the mid forehead. Concern for cervical spine injury.  EXAM: CT HEAD WITHOUT CONTRAST  CT CERVICAL SPINE WITHOUT CONTRAST  TECHNIQUE: Multidetector CT imaging of the head and cervical spine was performed following the standard protocol without intravenous contrast. Multiplanar CT image reconstructions of the cervical spine were also generated.  COMPARISON:  CT of the head and MRI/MRA of the brain performed 05/19/2012, and CT of the cervical spine performed 03/08/2012  FINDINGS: CT HEAD FINDINGS  There is no  evidence of acute infarction, mass lesion, or intra- or extra-axial hemorrhage on CT.  Prominence of the ventricles and sulci reflects mild cortical volume loss. Mild cerebellar atrophy is noted. Scattered periventricular and subcortical white matter change likely reflects small vessel ischemic microangiopathy.  The brainstem and fourth ventricle are within normal limits. The basal ganglia are unremarkable in appearance. The cerebral hemispheres demonstrate grossly normal gray-white differentiation. No mass effect or midline shift is seen.  There is no evidence of fracture; visualized osseous structures are unremarkable in appearance. The visualized portions of the orbits are within normal limits. The paranasal sinuses and mastoid air cells are well-aerated. A focal soft tissue laceration is noted overlying the right frontal calvarium. There is dense calcification along the vertebrobasilar system and at the circle of Willis.  CT  CERVICAL SPINE FINDINGS  There is no evidence of fracture or subluxation. Vertebral bodies demonstrate normal height and alignment. Multilevel disc space narrowing is noted along the lower cervical spine, with associated anterior and posterior disc osteophyte complexes new. Underlying facet disease is seen. Prevertebral soft tissues are within normal limits.  There is vaguely decreased attenuation in the right thyroid lobe, measuring 2.4 cm in size. The visualized lung apices are clear. No additional soft tissue abnormalities are seen.  IMPRESSION: 1. No evidence of traumatic intracranial injury or fracture. 2. No evidence of fracture or subluxation along the cervical spine. 3. Focal soft tissue laceration overlying the right frontal calvarium. 4. Mild cortical volume loss and scattered small vessel ischemic microangiopathy. 5. Dense calcification incidentally noted along the vertebrobasilar system and at the circle of Willis. 6. Mild degenerative change along the lower cervical spine. 7. Vaguely decreased attenuation in the right thyroid lobe, measuring 2.4 cm in size. Consider further evaluation with thyroid ultrasound. If patient is clinically hyperthyroid, consider nuclear medicine thyroid uptake and scan.   Electronically Signed   By: Roanna Raider M.D.   On: 01/02/2013 01:59   Ct Cervical Spine Wo Contrast  01/02/2013   CLINICAL DATA:  Status post fall; hit head on floor. Laceration to the mid forehead. Concern for cervical spine injury.  EXAM: CT HEAD WITHOUT CONTRAST  CT CERVICAL SPINE WITHOUT CONTRAST  TECHNIQUE: Multidetector CT imaging of the head and cervical spine was performed following the standard protocol without intravenous contrast. Multiplanar CT image reconstructions of the cervical spine were also generated.  COMPARISON:  CT of the head and MRI/MRA of the brain performed 05/19/2012, and CT of the cervical spine performed 03/08/2012  FINDINGS: CT HEAD FINDINGS  There is no evidence of acute  infarction, mass lesion, or intra- or extra-axial hemorrhage on CT.  Prominence of the ventricles and sulci reflects mild cortical volume loss. Mild cerebellar atrophy is noted. Scattered periventricular and subcortical white matter change likely reflects small vessel ischemic microangiopathy.  The brainstem and fourth ventricle are within normal limits. The basal ganglia are unremarkable in appearance. The cerebral hemispheres demonstrate grossly normal gray-white differentiation. No mass effect or midline shift is seen.  There is no evidence of fracture; visualized osseous structures are unremarkable in appearance. The visualized portions of the orbits are within normal limits. The paranasal sinuses and mastoid air cells are well-aerated. A focal soft tissue laceration is noted overlying the right frontal calvarium. There is dense calcification along the vertebrobasilar system and at the circle of Willis.  CT CERVICAL SPINE FINDINGS  There is no evidence of fracture or subluxation. Vertebral bodies demonstrate normal height and alignment. Multilevel disc space narrowing is  noted along the lower cervical spine, with associated anterior and posterior disc osteophyte complexes new. Underlying facet disease is seen. Prevertebral soft tissues are within normal limits.  There is vaguely decreased attenuation in the right thyroid lobe, measuring 2.4 cm in size. The visualized lung apices are clear. No additional soft tissue abnormalities are seen.  IMPRESSION: 1. No evidence of traumatic intracranial injury or fracture. 2. No evidence of fracture or subluxation along the cervical spine. 3. Focal soft tissue laceration overlying the right frontal calvarium. 4. Mild cortical volume loss and scattered small vessel ischemic microangiopathy. 5. Dense calcification incidentally noted along the vertebrobasilar system and at the circle of Willis. 6. Mild degenerative change along the lower cervical spine. 7. Vaguely decreased  attenuation in the right thyroid lobe, measuring 2.4 cm in size. Consider further evaluation with thyroid ultrasound. If patient is clinically hyperthyroid, consider nuclear medicine thyroid uptake and scan.   Electronically Signed   By: Roanna Raider M.D.   On: 01/02/2013 01:59   Serial evaluations in emergency department. Patient is not having any tachypnea or respiratory distress. Her lungs are clear. Her room air pulse ox is consistently 89-91%. She denies any chest pain or difficulty breathing. Daughter bedside states with the previous head injury had similar situation with her pulse ox. She is not on home oxygen. Chest x-ray reviewed as above. Medicine consulted for further recommendations and ABG ordered.   4:10 AM d/w Dr Conley Rolls, reviewed ABG and workup as above, given no difficulty breathing, respiratory complaints, normal pulmonary exam otherwise, feels she does not require admit for inpatient workup. Plan discharge back to facility with outpatient followup. Return precautions provided for any difficulty breathing, cough, chest pain.   Discussed with [facility] assisted living and patient can have oxygen at her facility tonight if discharged. Family bedside agrees to discharge plan. Suture removal in 5 days. Infection precautions provided. MDM  Diagnosis: fall, laceration, hypokalemia, hypoxia  Evaluated with CT scans and chest x-ray reviewed as above - as some hyperexpansion no infiltrates or edema.  No fever or cough or symptoms of pneumonia. No history of COPD or previous smoking. No leg pain or leg swelling or history of DVT/PE. No tachycardia or PE symptoms.   Labs reviewed as above and potassium provided. Vital signs, nurses Notes reviewed and considered - discharged home with 2L oxygen [place]     Sunnie Nielsen, MD 01/02/13 [phone]

## 2013-01-02 NOTE — ED Notes (Signed)
Pt presents via EMS with c/o fall. Pt resides at [facility]. Pt was trying to get out of her chair and fell forward, hitting her head on the floor. Per EMS, pt has an approx 4 inch lac across the middle of her forehead. Bleeding controlled at this time. Pt is not on any blood thinners.

## 2013-01-02 NOTE — ED Notes (Signed)
Pt. Oxygen level decreased to 88%. Pt. Placed on 2 liter oxygen per Dierdre Highman, MD.

## 2013-01-02 NOTE — ED Notes (Signed)
Pt placed on O2 via [place] when she first arrived in the ER r/t sats in the high 80's. Discussed taking O2 off of pt with Dr. Dierdre Highman, pt taken off of O2 and sats again dropped to 89%. Pt is back on 2L of O2 at this time.

## 2013-01-02 NOTE — ED Notes (Signed)
Pt. Made aware for the need of urine. 

## 2013-01-02 NOTE — ED Notes (Signed)
Bed: ZO10 Expected date:  Expected time:  Means of arrival:  Comments: EMS 79yo F. Fall, Head Lac

## 2013-01-06 ENCOUNTER — Encounter: Payer: Self-pay | Admitting: Nurse Practitioner

## 2013-01-06 ENCOUNTER — Non-Acute Institutional Stay: Payer: Medicare Other | Admitting: Nurse Practitioner

## 2013-01-06 DIAGNOSIS — S0101XS Laceration without foreign body of scalp, sequela: Secondary | ICD-10-CM

## 2013-01-06 DIAGNOSIS — F329 Major depressive disorder, single episode, unspecified: Secondary | ICD-10-CM

## 2013-01-06 DIAGNOSIS — W19XXXS Unspecified fall, sequela: Secondary | ICD-10-CM

## 2013-01-06 DIAGNOSIS — R269 Unspecified abnormalities of gait and mobility: Secondary | ICD-10-CM

## 2013-01-06 DIAGNOSIS — IMO0002 Reserved for concepts with insufficient information to code with codable children: Secondary | ICD-10-CM

## 2013-01-06 DIAGNOSIS — I10 Essential (primary) hypertension: Secondary | ICD-10-CM

## 2013-01-06 DIAGNOSIS — W19XXXA Unspecified fall, initial encounter: Secondary | ICD-10-CM | POA: Insufficient documentation

## 2013-01-06 DIAGNOSIS — E119 Type 2 diabetes mellitus without complications: Secondary | ICD-10-CM

## 2013-01-06 DIAGNOSIS — F32A Depression, unspecified: Secondary | ICD-10-CM

## 2013-01-06 DIAGNOSIS — F411 Generalized anxiety disorder: Secondary | ICD-10-CM

## 2013-01-06 DIAGNOSIS — S0101XA Laceration without foreign body of scalp, initial encounter: Secondary | ICD-10-CM | POA: Insufficient documentation

## 2013-01-06 DIAGNOSIS — F0390 Unspecified dementia without behavioral disturbance: Secondary | ICD-10-CM

## 2013-01-06 NOTE — Assessment & Plan Note (Signed)
MMSE 22/30 05/29/12--Aricept started and tolerated. Functioning well in AL   

## 2013-01-06 NOTE — Assessment & Plan Note (Signed)
Increased frailty and lack of safety awareness contributed to her falling. Intensive supervision needed.

## 2013-01-06 NOTE — Assessment & Plan Note (Signed)
Elevated SBP, continue Lisinopril 40mg  and increased Metoprolol succinate to 100mg -monitor Bp/P

## 2013-01-06 NOTE — Assessment & Plan Note (Signed)
Not disabling.   

## 2013-01-06 NOTE — Assessment & Plan Note (Signed)
Controlled, Hgb A1c 6.2 08/14/12, dc  Metformin 850mg  po daily-experienced explosive loose stools associated with Metformin.  Better with Glipizide 2.5mg  po daily.

## 2013-01-06 NOTE — Progress Notes (Signed)
Patient ID: Teresa Richards, female   DOB: 12-23-1918, 79 y.o.   MRN: [id number]   Code Status: DNR  Allergies  Allergen Reactions  . Codeine Other (See Comments)    Per [facility]    Chief Complaint  Patient presents with  . Medical Managment of Chronic Issues    s/p fall, skin  laceration wtih suture closure right forehead.  . Acute Visit    HPI: Patient is a 79 y.o. female seen in the AL at [facility] today for  evaluation of the right forehead skin laceration with suture closures sustained from fall at AL [facility] and other chronic medical conditions Problem List Items Addressed This Visit   Diabetes mellitus - Primary (Chronic)     Controlled, Hgb A1c 6.2 08/14/12, dc  Metformin 850mg  po daily-experienced explosive loose stools associated with Metformin.  Better with Glipizide 2.5mg  po daily.                      HTN (hypertension) (Chronic)     Elevated SBP, continue Lisinopril 40mg  and increased Metoprolol succinate to 100mg -monitor Bp/P                      Depression (Chronic)     Flat affect as usual.     Dementia      MMSE 22/30 05/29/12--Aricept started and tolerated. Functioning well in AL                      Abnormality of gait     Worse since her lower back pain. Ambulates with walker for short distance and w/c to go further. Intensive supervision needed for safety.       Anxiety state, unspecified     Not disabling.     Laceration of skin of scalp     The right forehead skin laceration sustained from a fall at [facility] when the patient attempted to transfer herself. Suture closure is intact and will removed 01/07/13    Fall     Increased frailty and lack of safety awareness contributed to her falling. Intensive supervision needed.        Review of Systems:  Review of Systems  Constitutional: Negative for fever, chills, weight loss and malaise/fatigue.  HENT: Positive for hearing loss. Negative for congestion, ear  discharge, ear pain, nosebleeds and sore throat.   Eyes: Negative for pain, discharge and redness.  Respiratory: Negative for cough, sputum production and wheezing.   Cardiovascular: Negative for chest pain, palpitations, orthopnea, claudication, leg swelling and PND.  Gastrointestinal: Negative for heartburn, nausea, vomiting, diarrhea and constipation.       Explosive loose stools resolved after Metformin discontinued.   Genitourinary: Positive for frequency. Negative for dysuria and urgency.  Musculoskeletal: Positive for back pain. Negative for falls, joint pain, myalgias and neck pain.       Lower back pain worse-rides w/c more for mobility.   Skin: Negative for itching and rash.       The right forehead skin laceration with suture closures w/o s/s of infection.   Neurological: Negative for dizziness, tingling, tremors, speech change, focal weakness, seizures, loss of consciousness, weakness and headaches.       Unsteady gait.   Endo/Heme/Allergies: Negative for environmental allergies and polydipsia. Does not bruise/bleed easily.  Psychiatric/Behavioral: Positive for memory loss. Negative for depression and hallucinations. The patient does not have insomnia.      Past Medical History  Diagnosis  Date  . Diabetes mellitus without complication   . Hypertension   . Melanoma   . Dementia    Social History:   reports that she has never smoked. She has never used smokeless tobacco. She reports that she does not drink alcohol or use illicit drugs.  Family History  Problem Relation Age of Onset  . Leukemia Father     Medications: [name] Medications  New Prescriptions   No medications on file  Previous Medications   ACETAMINOPHEN (TYLENOL) 325 MG TABLET    Take 650 mg by mouth every 4 (four) hours as needed for moderate pain.   AMLODIPINE (NORVASC) 5 MG TABLET    Take 5 mg by mouth daily.   ASPIRIN 81 MG TABLET    Take 81 mg by mouth daily.   CELECOXIB (CELEBREX) 200 MG  CAPSULE    Take 200 mg by mouth daily.   DONEPEZIL (ARICEPT) 10 MG TABLET    Take 10 mg by mouth daily.    GLIPIZIDE (GLUCOTROL) 5 MG TABLET    Take 2.5 mg by mouth daily before breakfast.   LISINOPRIL (PRINIVIL,ZESTRIL) 40 MG TABLET    Take 40 mg by mouth daily.   METOPROLOL SUCCINATE (TOPROL-XL) 100 MG 24 HR TABLET    Take 100 mg by mouth daily. Take with or immediately following a meal.   MULTIPLE VITAMINS-MINERALS (CERTAVITE/ANTIOXIDANTS PO)    Take 1 tablet by mouth daily.  Modified Medications   No medications on file  Discontinued Medications   No medications on file     Physical Exam: Physical Exam  Constitutional: She is oriented to person, place, and time. She appears well-developed and well-nourished.  HENT:  Head: Normocephalic and atraumatic.  Right Ear: External ear normal.  Left Ear: External ear normal.  Eyes: Conjunctivae and EOM are normal. Pupils are equal, round, and reactive to light.  Neck: Neck supple. No JVD present. No thyromegaly present.  Cardiovascular: Normal rate.   Murmur heard.  Systolic murmur is present with a grade of 2/6  Pulmonary/Chest: Effort normal. She has rales (left basilar ).  Abdominal: Soft. Bowel sounds are normal. There is no tenderness.  Musculoskeletal: She exhibits tenderness. She exhibits no edema.  Lower back, localized, aggravated with weight bearing, positional.   Lymphadenopathy:    She has no cervical adenopathy.  Neurological: She is alert and oriented to person, place, and time. She has normal reflexes. No cranial nerve deficit. She exhibits normal muscle tone. Coordination normal.  Skin: Skin is warm and dry. No rash noted. No erythema.  The right forehead skin laceration with suture closures. No s/s of wound infection.   Psychiatric: She has a normal mood and affect. Her behavior is normal. Judgment and thought content normal. Her speech is not delayed and not slurred. She is not agitated, not aggressive, not hyperactive,  not slowed, not withdrawn, not actively hallucinating and not combative. Thought content is not paranoid and not delusional. Cognition and memory are impaired. She exhibits abnormal recent memory.    Filed Vitals:   01/06/13 2230  BP: 168/72  Pulse: 64  Temp: 99.1 F (37.3 C)  TempSrc: Tympanic  Resp: 18      Labs reviewed: Basic Metabolic Panel:  Recent Labs  16/10/96 0958 05/19/12 1509 05/19/12 1510 05/20/12 0550 05/21/12 1513  09/11/12 01/02/13 0210 01/02/13 0219  NA 134*  --   --  135 138  < > 139 135 137  K 3.1*  --   --  3.8  3.0*  < > 3.6 3.2* 3.6  CL 93*  --   --  95* 102  --   --  97 97  CO2 28  --   --  27 27  --   --  28  --   GLUCOSE 247*  --   --  154* 116*  --   --  162* 156*  BUN 10  --   --  24* 18  < > 8 15 18   CREATININE 0.53 0.61  --  1.29* 0.82  < > 0.7 0.83 0.90  CALCIUM 10.1  --   --  9.3 9.2  --   --  9.4  --   MG  --   --  1.6  --   --   --   --   --   --   TSH  --  0.332*  --   --   --   --   --   --   --   < > = values in this interval not displayed. Liver Function Tests:  Recent Labs  03/08/12 1420 05/19/12 0958 01/02/13 0210  AST 21 21 18   ALT 14 15 17   ALKPHOS 83 90 65  BILITOT 0.5 0.5 0.4  PROT 7.5 7.9 6.5  ALBUMIN 4.2 4.3 3.5   CBC:  Recent Labs  03/08/12 1420  05/19/12 0958 05/19/12 1509 05/20/12 0550 01/02/13 0210 01/02/13 0219  WBC 5.6  < > 7.5 9.2 5.6 3.2*  --   NEUTROABS 4.7  --  6.6  --   --   --   --   HGB 14.3  < > 14.8 14.7 12.7 12.3 12.6  HCT 41.4  < > 42.0 41.5 36.5 35.9* 37.0  MCV 83.3  < > 79.5 79.7 79.9 82.5  --   PLT 179  < > 232 239 232 94*  --   < > = values in this interval not displayed. Lipid Panel:  Recent Labs  05/20/12 0550  CHOL 191  HDL 38*  LDLCALC 118*  TRIG 174*  CHOLHDL 5.0     Past Procedures:      CT Head Wo Contrast (Final result)   Result time: 01/01/12 18:46:19    IMPRESSION:  1.  Atrophy and chronic ischemic white matter disease without acute intracranial  abnormality. 2.  Midline and left parietal scalp hematoma and contusion.  Assessment/Plan Diabetes mellitus Controlled, Hgb A1c 6.2 08/14/12, dc  Metformin 850mg  po daily-experienced explosive loose stools associated with Metformin.  Better with Glipizide 2.5mg  po daily.                    HTN (hypertension) Elevated SBP, continue Lisinopril 40mg  and increased Metoprolol succinate to 100mg -monitor Bp/P                    Depression Flat affect as usual.   Dementia  MMSE 22/30 05/29/12--Aricept started and tolerated. Functioning well in AL                    Abnormality of gait Worse since her lower back pain. Ambulates with walker for short distance and w/c to go further. Intensive supervision needed for safety.     Anxiety state, unspecified Not disabling.   Laceration of skin of scalp The right forehead skin laceration sustained from a fall at [facility] when the patient attempted to transfer herself. Suture closure is intact and will removed 01/07/13  Fall Increased frailty  and lack of safety awareness contributed to her falling. Intensive supervision needed.     Family/ Staff Communication: observe the patient.   Goals of Care: AL  Labs/tests ordered: none

## 2013-01-06 NOTE — Assessment & Plan Note (Signed)
Flat affect as usual.  

## 2013-01-06 NOTE — Assessment & Plan Note (Signed)
Worse since her lower back pain. Ambulates with walker for short distance and w/c to go further. Intensive supervision needed for safety.    

## 2013-01-06 NOTE — Assessment & Plan Note (Addendum)
The right forehead skin laceration sustained from a fall at [facility] when the patient attempted to transfer herself. Suture closure is intact and will removed 01/07/13

## 2013-02-13 ENCOUNTER — Encounter: Payer: Self-pay | Admitting: Nurse Practitioner

## 2013-02-13 NOTE — Progress Notes (Signed)
This encounter was created in error - please disregard.

## 2013-02-20 ENCOUNTER — Encounter: Payer: Self-pay | Admitting: Nurse Practitioner

## 2013-02-20 ENCOUNTER — Non-Acute Institutional Stay: Payer: Medicare Other | Admitting: Nurse Practitioner

## 2013-02-20 DIAGNOSIS — F32A Depression, unspecified: Secondary | ICD-10-CM

## 2013-02-20 DIAGNOSIS — I10 Essential (primary) hypertension: Secondary | ICD-10-CM

## 2013-02-20 DIAGNOSIS — M545 Low back pain, unspecified: Secondary | ICD-10-CM

## 2013-02-20 DIAGNOSIS — L309 Dermatitis, unspecified: Secondary | ICD-10-CM

## 2013-02-20 DIAGNOSIS — G609 Hereditary and idiopathic neuropathy, unspecified: Secondary | ICD-10-CM

## 2013-02-20 DIAGNOSIS — N318 Other neuromuscular dysfunction of bladder: Secondary | ICD-10-CM

## 2013-02-20 DIAGNOSIS — F329 Major depressive disorder, single episode, unspecified: Secondary | ICD-10-CM

## 2013-02-20 DIAGNOSIS — L259 Unspecified contact dermatitis, unspecified cause: Secondary | ICD-10-CM

## 2013-02-20 DIAGNOSIS — F3289 Other specified depressive episodes: Secondary | ICD-10-CM

## 2013-02-20 DIAGNOSIS — F0390 Unspecified dementia without behavioral disturbance: Secondary | ICD-10-CM

## 2013-02-20 DIAGNOSIS — F411 Generalized anxiety disorder: Secondary | ICD-10-CM

## 2013-02-20 NOTE — Assessment & Plan Note (Signed)
The patient stated it has been on and off in the past. Dc Celebrex-observe the patient.

## 2013-02-20 NOTE — Assessment & Plan Note (Signed)
controlled, continue Lisinopril 40mg and Metoprolol succinate 100mg  

## 2013-02-20 NOTE — Assessment & Plan Note (Signed)
Flat affect as usual.  

## 2013-02-20 NOTE — Assessment & Plan Note (Signed)
MMSE 22/30 05/29/12--Aricept started and tolerated. Functioning well in AL   

## 2013-02-20 NOTE — Assessment & Plan Note (Signed)
Not disabling.   

## 2013-02-20 NOTE — Assessment & Plan Note (Signed)
Increased lower body weakness. Denied pain or tingling or numbness today.

## 2013-02-20 NOTE — Assessment & Plan Note (Signed)
No c/o today, off Myrbetriq   

## 2013-02-20 NOTE — Assessment & Plan Note (Addendum)
Itching papular rash at the left neck-dc Celebrex-apply Hydrocortisone 1% cream bid for 2 weeks-observe the patient and avoid turtle neck sweater.

## 2013-02-20 NOTE — Progress Notes (Signed)
Patient ID: Teresa Richards, female   DOB: July 14, 1918, 80 y.o.   MRN: [id number]   Code Status: DNR  Allergies  Allergen Reactions  . Codeine Other (See Comments)    Per [facility]    Chief Complaint  Patient presents with  . Medical Managment of Chronic Issues    itchign rash left neck  . Acute Visit    HPI: Patient is a 80 y.o. female seen in the AL at [facility] today for  evaluation of the left neck itching rash and other chronic medical conditions Problem List Items Addressed This Visit   Anxiety state, unspecified - Primary     Not disabling.       Dementia      MMSE 22/30 05/29/12--Aricept started and tolerated. Functioning well in AL                        Depression (Chronic)     Flat affect as usual.       Dermatitis     Itching papular rash at the left neck-dc Celebrex-apply Hydrocortisone 1% cream bid for 2 weeks-observe the patient and avoid turtle neck sweater.     HTN (hypertension) (Chronic)     controlled, continue Lisinopril 40mg  and Metoprolol succinate 100mg                         Hypertonicity of bladder     No c/o today, off Myrbetriq          Lumbago     The patient stated it has been on and off in the past. Dc Celebrex-observe the patient.       Unspecified hereditary and idiopathic peripheral neuropathy     Increased lower body weakness. Denied pain or tingling or numbness today.        Review of Systems:  Review of Systems  Constitutional: Negative for fever, chills, weight loss and malaise/fatigue.  HENT: Positive for hearing loss. Negative for congestion, ear discharge, ear pain, nosebleeds and sore throat.   Eyes: Negative for pain, discharge and redness.  Respiratory: Negative for cough, sputum production and wheezing.   Cardiovascular: Negative for chest pain, palpitations, orthopnea, claudication, leg swelling and PND.  Gastrointestinal: Negative for heartburn, nausea, vomiting, diarrhea  and constipation.       Explosive loose stools resolved after Metformin discontinued.   Genitourinary: Positive for frequency. Negative for dysuria and urgency.  Musculoskeletal: Positive for back pain. Negative for falls, joint pain, myalgias and neck pain.       Lower back pain worse-rides w/c more for mobility.   Skin: Positive for rash. Negative for itching.       Left neck papular rash-pruritic.   Neurological: Negative for dizziness, tingling, tremors, speech change, focal weakness, seizures, loss of consciousness, weakness and headaches.       Unsteady gait.   Endo/Heme/Allergies: Negative for environmental allergies and polydipsia. Bruises/bleeds easily.  Psychiatric/Behavioral: Positive for memory loss. Negative for depression and hallucinations. The patient does not have insomnia.      Past Medical History  Diagnosis Date  . Diabetes mellitus without complication   . Hypertension   . Melanoma   . Dementia    Social History:   reports that she has never smoked. She has never used smokeless tobacco. She reports that she does not drink alcohol or use illicit drugs.  Family History  Problem Relation Age of Onset  . Leukemia Father  Medications: [name] Medications  New Prescriptions   No medications on file  Previous Medications   ACETAMINOPHEN (TYLENOL) 325 MG TABLET    Take 650 mg by mouth every 4 (four) hours as needed for moderate pain.   AMLODIPINE (NORVASC) 5 MG TABLET    Take 5 mg by mouth daily.   ASPIRIN 81 MG TABLET    Take 81 mg by mouth daily.   DONEPEZIL (ARICEPT) 10 MG TABLET    Take 10 mg by mouth daily.    GLIPIZIDE (GLUCOTROL) 5 MG TABLET    Take 2.5 mg by mouth daily before breakfast.   LISINOPRIL (PRINIVIL,ZESTRIL) 40 MG TABLET    Take 40 mg by mouth daily.   METOPROLOL SUCCINATE (TOPROL-XL) 100 MG 24 HR TABLET    Take 100 mg by mouth daily. Take with or immediately following a meal.   MULTIPLE VITAMINS-MINERALS (CERTAVITE/ANTIOXIDANTS PO)     Take 1 tablet by mouth daily.  Modified Medications   No medications on file  Discontinued Medications   CELECOXIB (CELEBREX) 200 MG CAPSULE    Take 200 mg by mouth daily.     Physical Exam: Physical Exam  Constitutional: She is oriented to person, place, and time. She appears well-developed and well-nourished.  HENT:  Head: Normocephalic and atraumatic.  Right Ear: External ear normal.  Left Ear: External ear normal.  Eyes: Conjunctivae and EOM are normal. Pupils are equal, round, and reactive to light.  Neck: Neck supple. No JVD present. No thyromegaly present.  Cardiovascular: Normal rate.   Murmur heard.  Systolic murmur is present with a grade of 2/6  Pulmonary/Chest: Effort normal. She has rales (left basilar ).  Abdominal: Soft. Bowel sounds are normal. There is no tenderness.  Musculoskeletal: She exhibits tenderness. She exhibits no edema.  Lower back, localized, aggravated with weight bearing, positional.   Lymphadenopathy:    She has no cervical adenopathy.  Neurological: She is alert and oriented to person, place, and time. She has normal reflexes. No cranial nerve deficit. She exhibits normal muscle tone. Coordination normal.  Skin: Skin is warm and dry. Rash noted. No erythema.  Papular rash left neck.   Psychiatric: She has a normal mood and affect. Her behavior is normal. Judgment and thought content normal. Her speech is not delayed and not slurred. She is not agitated, not aggressive, not hyperactive, not slowed, not withdrawn, not actively hallucinating and not combative. Thought content is not paranoid and not delusional. Cognition and memory are impaired. She exhibits abnormal recent memory.    Filed Vitals:   02/20/13 1523  BP: 140/66  Pulse: 56  Temp: 98.3 F (36.8 C)  TempSrc: Tympanic  Resp: 20      Labs reviewed: Basic Metabolic Panel:  Recent Labs  05/19/12 0958 05/19/12 1509 05/19/12 1510 05/20/12 0550 05/21/12 1513  09/11/12  01/02/13 0210 01/02/13 0219  NA 134*  --   --  135 138  < > 139 135 137  K 3.1*  --   --  3.8 3.0*  < > 3.6 3.2* 3.6  CL 93*  --   --  95* 102  --   --  97 97  CO2 28  --   --  27 27  --   --  28  --   GLUCOSE 247*  --   --  154* 116*  --   --  162* 156*  BUN 10  --   --  24* 18  < > 8 15 18  CREATININE 0.53 0.61  --  1.29* 0.82  < > 0.7 0.83 0.90  CALCIUM 10.1  --   --  9.3 9.2  --   --  9.4  --   MG  --   --  1.6  --   --   --   --   --   --   TSH  --  0.332*  --   --   --   --   --   --   --   < > = values in this interval not displayed. Liver Function Tests:  Recent Labs  03/08/12 1420 05/19/12 0958 01/02/13 0210  AST 21 21 18   ALT 14 15 17   ALKPHOS 83 90 65  BILITOT 0.5 0.5 0.4  PROT 7.5 7.9 6.5  ALBUMIN 4.2 4.3 3.5   CBC:  Recent Labs  03/08/12 1420  05/19/12 0958 05/19/12 1509 05/20/12 0550 01/02/13 0210 01/02/13 0219  WBC 5.6  < > 7.5 9.2 5.6 3.2*  --   NEUTROABS 4.7  --  6.6  --   --   --   --   HGB 14.3  < > 14.8 14.7 12.7 12.3 12.6  HCT 41.4  < > 42.0 41.5 36.5 35.9* 37.0  MCV 83.3  < > 79.5 79.7 79.9 82.5  --   PLT 179  < > 232 239 232 94*  --   < > = values in this interval not displayed. Lipid Panel:  Recent Labs  05/20/12 0550  CHOL 191  HDL 38*  LDLCALC 118*  TRIG 174*  CHOLHDL 5.0     Past Procedures:      CT Head Wo Contrast (Final result)   Result time: 01/01/12 18:46:19    IMPRESSION:  1.  Atrophy and chronic ischemic white matter disease without acute intracranial abnormality. 2.  Midline and left parietal scalp hematoma and contusion.  Assessment/Plan Anxiety state, unspecified Not disabling.     Dementia  MMSE 22/30 05/29/12--Aricept started and tolerated. Functioning well in AL                      Depression Flat affect as usual.     HTN (hypertension) controlled, continue Lisinopril 40mg  and Metoprolol succinate 100mg                       Hypertonicity of bladder No  c/o today, off Myrbetriq        Lumbago The patient stated it has been on and off in the past. Dc Celebrex-observe the patient.     Unspecified hereditary and idiopathic peripheral neuropathy Increased lower body weakness. Denied pain or tingling or numbness today.   Dermatitis Itching papular rash at the left neck-dc Celebrex-apply Hydrocortisone 1% cream bid for 2 weeks-observe the patient and avoid turtle neck sweater.     Family/ Staff Communication: observe the patient.   Goals of Care: AL  Labs/tests ordered: none

## 2013-03-03 ENCOUNTER — Encounter: Payer: Self-pay | Admitting: Nurse Practitioner

## 2013-03-03 NOTE — Progress Notes (Signed)
This encounter was created in error - please disregard.

## 2013-04-21 ENCOUNTER — Encounter: Payer: Self-pay | Admitting: Internal Medicine

## 2013-04-21 ENCOUNTER — Non-Acute Institutional Stay: Payer: Medicare Other | Admitting: Internal Medicine

## 2013-04-21 VITALS — BP 144/76 | HR 74 | Ht 59.5 in | Wt 135.0 lb

## 2013-04-21 DIAGNOSIS — E1149 Type 2 diabetes mellitus with other diabetic neurological complication: Secondary | ICD-10-CM

## 2013-04-21 DIAGNOSIS — I10 Essential (primary) hypertension: Secondary | ICD-10-CM

## 2013-04-21 DIAGNOSIS — F0390 Unspecified dementia without behavioral disturbance: Secondary | ICD-10-CM

## 2013-04-21 DIAGNOSIS — E1140 Type 2 diabetes mellitus with diabetic neuropathy, unspecified: Secondary | ICD-10-CM | POA: Insufficient documentation

## 2013-04-21 NOTE — Progress Notes (Signed)
Patient ID: Teresa Richards, female   DOB: 05-13-1918, 80 y.o.   MRN: [id number]    Location:  [facility]  Place of Service: CLINIC    Allergies  Allergen Reactions  . Codeine Other (See Comments)    Per [facility]    Chief Complaint  Patient presents with  . Medical Managment of Chronic Issues    blood sugar, blood pressure, depression, dementia    HPI:  HTN (hypertension): controlled  Dementia: slowly progressing. On donepezil.  Type II or unspecified type diabetes mellitus with neurological manifestations, not stated as uncontrolled: stable    Medications: [name] Medications  New Prescriptions   No medications on file  Previous Medications   ACETAMINOPHEN (TYLENOL) 325 MG TABLET    Take 650 mg by mouth every 4 (four) hours as needed for moderate pain.   AMLODIPINE (NORVASC) 5 MG TABLET    Take 5 mg by mouth daily.   ASPIRIN 81 MG TABLET    Take 81 mg by mouth daily.   DONEPEZIL (ARICEPT) 10 MG TABLET    Take 10 mg by mouth daily.    GLIPIZIDE (GLUCOTROL) 5 MG TABLET    Take 2.5 mg by mouth daily before breakfast.   LISINOPRIL (PRINIVIL,ZESTRIL) 40 MG TABLET    Take 40 mg by mouth daily.   METOPROLOL SUCCINATE (TOPROL-XL) 100 MG 24 HR TABLET    Take 100 mg by mouth daily. Take with or immediately following a meal.   MULTIPLE VITAMINS-MINERALS (CERTAVITE/ANTIOXIDANTS PO)    Take 1 tablet by mouth daily.  Modified Medications   No medications on file  Discontinued Medications   No medications on file     Review of Systems  Constitutional: Positive for fatigue.  HENT: Negative.   Eyes: Positive for visual disturbance (corrective lenses).  Respiratory: Negative.   Gastrointestinal: Negative.   Endocrine: Positive for polyuria.       Diabetic with neuropathy  Genitourinary: Positive for frequency. Negative for dysuria and flank pain.  Musculoskeletal: Positive for gait problem.  Skin: Negative.   Allergic/Immunologic: Negative.   Neurological: Negative for tremors,  seizures, syncope, facial asymmetry, speech difficulty, weakness, light-headedness, numbness and headaches.       Dementia   Psychiatric/Behavioral: Positive for sleep disturbance.    Filed Vitals:   04/21/13 0936  BP: 144/76  Pulse: 74  Height: 4' 11.5" (1.511 m)  Weight: 135 lb (61.236 kg)   Physical Exam  Constitutional: She is oriented to person, place, and time. She appears well-developed and well-nourished.  HENT:  Head: Normocephalic and atraumatic.  Right Ear: External ear normal.  Left Ear: External ear normal.  Eyes: Conjunctivae and EOM are normal. Pupils are equal, round, and reactive to light.  Neck: Neck supple. No JVD present. No tracheal deviation present. No thyromegaly present.  Cardiovascular: Normal rate.   Murmur heard.  Systolic murmur is present with a grade of 2/6  Pulmonary/Chest: Effort normal. She has rales (left basilar ).  Abdominal: Soft. Bowel sounds are normal. She exhibits no mass. There is no tenderness.  Musculoskeletal: She exhibits no edema and no tenderness.  Unstable gait. Using rolling walker.   Lymphadenopathy:    She has no cervical adenopathy.  Neurological: She is alert and oriented to person, place, and time. She has normal reflexes. No cranial nerve deficit. She exhibits normal muscle tone. Coordination normal.  Demented  Skin: Skin is warm and dry. No rash noted. No erythema.  Papular rash left neck.   Psychiatric: She has a  normal mood and affect. Her behavior is normal. Judgment and thought content normal. Her speech is not delayed and not slurred. She is not agitated, not aggressive, not hyperactive, not slowed, not withdrawn, not actively hallucinating and not combative. Thought content is not paranoid and not delusional. Cognition and memory are impaired. She exhibits abnormal recent memory.     Labs reviewed: No visits with results within 3 Month(s) from this visit. Latest known visit with results is:  Admission on  01/02/2013, Discharged on 01/02/2013  Component Date Value Ref Range Status  . WBC 01/02/2013 3.2* 4.0 - 10.5 K/uL Final  . RBC 01/02/2013 4.35  3.87 - 5.11 MIL/uL Final  . Hemoglobin 01/02/2013 12.3  12.0 - 15.0 g/dL Final  . HCT 01/02/2013 35.9* 36.0 - 46.0 % Final  . MCV 01/02/2013 82.5  78.0 - 100.0 fL Final  . MCH 01/02/2013 28.3  26.0 - 34.0 pg Final  . MCHC 01/02/2013 34.3  30.0 - 36.0 g/dL Final  . RDW 01/02/2013 14.0  11.5 - 15.5 % Final  . Platelets 01/02/2013 94* 150 - 400 K/uL Final   Comment: REPEATED TO VERIFY                          SPECIMEN CHECKED FOR CLOTS                          PLATELET COUNT CONFIRMED BY SMEAR                          LARGE PLATELETS PRESENT  . Sodium 01/02/2013 135  135 - 145 mEq/L Final  . Potassium 01/02/2013 3.2* 3.5 - 5.1 mEq/L Final  . Chloride 01/02/2013 97  96 - 112 mEq/L Final  . CO2 01/02/2013 28  19 - 32 mEq/L Final  . Glucose, Bld 01/02/2013 162* 70 - 99 mg/dL Final  . BUN 01/02/2013 15  6 - 23 mg/dL Final  . Creatinine, Ser 01/02/2013 0.83  0.50 - 1.10 mg/dL Final  . Calcium 01/02/2013 9.4  8.4 - 10.5 mg/dL Final  . Total Protein 01/02/2013 6.5  6.0 - 8.3 g/dL Final  . Albumin 01/02/2013 3.5  3.5 - 5.2 g/dL Final  . AST 01/02/2013 18  0 - 37 U/L Final  . ALT 01/02/2013 17  0 - 35 U/L Final  . Alkaline Phosphatase 01/02/2013 65  39 - 117 U/L Final  . Total Bilirubin 01/02/2013 0.4  0.3 - 1.2 mg/dL Final  . GFR calc non Af Amer 01/02/2013 59* >90 mL/min Final  . GFR calc Af Amer 01/02/2013 69* >90 mL/min Final   Comment: (NOTE)                          The eGFR has been calculated using the CKD EPI equation.                          This calculation has not been validated in all clinical situations.                          eGFR's persistently <90 mL/min signify possible Chronic Kidney                          Disease.  . Sodium 01/02/2013  137  135 - 145 mEq/L Final  . Potassium 01/02/2013 3.6  3.5 - 5.1 mEq/L Final  . Chloride  01/02/2013 97  96 - 112 mEq/L Final  . BUN 01/02/2013 18  6 - 23 mg/dL Final  . Creatinine, Ser 01/02/2013 0.90  0.50 - 1.10 mg/dL Final  . Glucose, Bld 01/02/2013 156* 70 - 99 mg/dL Final  . Calcium, Ion 01/02/2013 1.19  1.13 - 1.30 mmol/L Final  . TCO2 01/02/2013 29  0 - 100 mmol/L Final  . Hemoglobin 01/02/2013 12.6  12.0 - 15.0 g/dL Final  . HCT 01/02/2013 37.0  36.0 - 46.0 % Final  . FIO2 01/02/2013 0.21   Final  . pH, Arterial 01/02/2013 7.442  7.350 - 7.450 Final  . pCO2 arterial 01/02/2013 42.8  35.0 - 45.0 mmHg Final  . pO2, Arterial 01/02/2013 55.1* 80.0 - 100.0 mmHg Final  . Bicarbonate 01/02/2013 28.7* 20.0 - 24.0 mEq/L Final  . TCO2 01/02/2013 25.9  0 - 100 mmol/L Final  . Acid-Base Excess 01/02/2013 4.6* 0.0 - 2.0 mmol/L Final  . O2 Saturation 01/02/2013 89.0   Final  . Patient temperature 01/02/2013 98.6   Final  . Collection site 01/02/2013 LEFT RADIAL   Final  . Drawn by 01/02/2013 [id number]   Final  . Sample type 01/02/2013 ARTERIAL DRAW   Final  . Allens test (pass/fail) 01/02/2013 PASS  PASS Final   Recent Labs   05/19/12 1761  05/19/12 1509  05/19/12 1510  05/20/12 0550  05/21/12 1513   09/11/12  01/02/13 0210  01/02/13 0219   NA  134*  --  --  135  138  < >  139  135  137   K  3.1*  --  --  3.8  3.0*  < >  3.6  3.2*  3.6   CL  93*  --  --  95*  102  --  --  97  97   CO2  28  --  --  27  27  --  --  28  --   GLUCOSE  247*  --  --  154*  116*  --  --  162*  156*   BUN  10  --  --  24*  18  < >  _0 CREATININE  0.53  0.61  --  1.29*  0.82  < >  0.7  0.83  0.90   CALCIUM  10.1  --  --  9.3  9.2  --  --  9.4  --   MG  --  --  1.6  --  --  --  --  --  --   TSH  --  0.332*  --  --  --  --  --  --  --   < > = values in this interval not displayed.  Liver Function Tests:  Recent Labs   03/08/12 1420  05/19/12 0958  01/02/13 0210   AST  _1 ALT  _2 ALKPHOS  83  90  65   BILITOT  0.5  0.5  0.4   PROT  7.5  7.9  6.5   ALBUMIN  4.2   4.3  3.5   CBC:  Recent Labs   03/08/12 1420   05/19/12 0958  05/19/12 1509  05/20/12 0550  01/02/13 0210  01/02/13 0219   WBC  5.6  < >  7.5  9.2  5.6  3.2*  --   NEUTROABS  4.7  --  6.6  --  --  --  --   HGB  14.3  < >  14.8  14.7  12.7  12.3  12.6   HCT  41.4  < >  42.0  41.5  36.5  35.9*  37.0   MCV  83.3  < >  79.5  79.7  79.9  82.5  --   PLT  179  < >  232  239  232  94*  --   < > = values in this interval not displayed.  Lipid Panel:  Recent Labs   05/20/12 0550   CHOL  191   HDL  38*   LDLCALC  118*   TRIG  174*   CHOLHDL  5.0   Past Procedures:  CT Head Wo Contrast (Final result) Result time: 01/01/12 18:46:19  IMPRESSION:  1. Atrophy and chronic ischemic white matter disease without acute  intracranial abnormality.  2. Midline and left parietal scalp hematoma and contusion.    Assessment/Plan  1. HTN (hypertension) controlled  2. Dementia uunchanged  3. Type II or unspecified type diabetes mellitus with neurological manifestations, not stated as uncontrolled Stable -A1c, CMP

## 2013-05-06 ENCOUNTER — Encounter: Payer: Self-pay | Admitting: *Deleted

## 2013-06-05 ENCOUNTER — Encounter: Payer: Self-pay | Admitting: *Deleted

## 2013-09-01 ENCOUNTER — Encounter: Payer: Self-pay | Admitting: Nurse Practitioner

## 2013-09-11 ENCOUNTER — Encounter: Payer: Self-pay | Admitting: Nurse Practitioner

## 2013-09-11 ENCOUNTER — Non-Acute Institutional Stay: Payer: Medicare Other | Admitting: Nurse Practitioner

## 2013-09-11 DIAGNOSIS — I10 Essential (primary) hypertension: Secondary | ICD-10-CM

## 2013-09-11 DIAGNOSIS — E1149 Type 2 diabetes mellitus with other diabetic neurological complication: Secondary | ICD-10-CM

## 2013-09-11 NOTE — Assessment & Plan Note (Signed)
09/01/13 consultant pharmacist recommendation: dc Glipizide 2.5mg  po qam. FSBS bid in 90s-100s. Last Hgb A1c 5.9 4/20-15. 70yro. Continue Bid FSBS x 1wk-then re-eval. Fasting CBG 97, 106, 107, 103, 99. 97. 103. Dc CBG-Hgb A1c in one monht.

## 2013-09-11 NOTE — Progress Notes (Signed)
Patient ID: Teresa Richards, female   DOB: 1919-01-22, 80 y.o.   MRN: [id number]   Code Status: DNR  Allergies  Allergen Reactions  . Codeine Other (See Comments)    Per [facility]    Chief Complaint  Patient presents with  . Medical Management of Chronic Issues  . Acute Visit    blood sugar    HPI: Patient is a 80 y.o. female seen in the AL at [facility] today for  evaluation of blood sugar and  chronic medical conditions Problem List Items Addressed This Visit   HTN (hypertension) (Chronic)     controlled, continue Lisinopril 40mg  and Metoprolol succinate 100mg  and Norvasc 5mg        Type II or unspecified type diabetes mellitus with neurological manifestations, not stated as uncontrolled - Primary     09/01/13 consultant pharmacist recommendation: dc Glipizide 2.5mg  po qam. FSBS bid in 90s-100s. Last Hgb A1c 5.9 4/20-15. 16yro. Continue Bid FSBS x 1wk-then re-eval. Fasting CBG 97, 106, 107, 103, 99. 97. 103. Dc CBG-Hgb A1c in one monht.        Review of Systems:  Review of Systems  Constitutional: Negative for fever, chills, weight loss and malaise/fatigue.  HENT: Positive for hearing loss. Negative for congestion, ear discharge, ear pain, nosebleeds and sore throat.   Eyes: Negative for pain, discharge and redness.  Respiratory: Negative for cough, sputum production and wheezing.   Cardiovascular: Negative for chest pain, palpitations, orthopnea, claudication, leg swelling and PND.  Gastrointestinal: Negative for heartburn, nausea, vomiting, diarrhea and constipation.       Explosive loose stools resolved after Metformin discontinued.   Genitourinary: Positive for frequency. Negative for dysuria and urgency.  Musculoskeletal: Positive for back pain. Negative for falls, joint pain, myalgias and neck pain.       Lower back pain worse-rides w/c more for mobility.   Skin: Positive for rash. Negative for itching.       Left neck papular rash-pruritic.   Neurological: Negative  for dizziness, tingling, tremors, speech change, focal weakness, seizures, loss of consciousness, weakness and headaches.       Unsteady gait.   Endo/Heme/Allergies: Negative for environmental allergies and polydipsia. Bruises/bleeds easily.  Psychiatric/Behavioral: Positive for memory loss. Negative for depression and hallucinations. The patient does not have insomnia.      Past Medical History  Diagnosis Date  . Diabetes mellitus without complication   . Hypertension   . Melanoma   . Dementia    Social History:   reports that she has never smoked. She has never used smokeless tobacco. She reports that she does not drink alcohol or use illicit drugs.  Family History  Problem Relation Age of Onset  . Leukemia Father     Medications: [name] Medications  New Prescriptions   No medications on file  Previous Medications   ACETAMINOPHEN (TYLENOL) 325 MG TABLET    Take 650 mg by mouth every 4 (four) hours as needed for moderate pain.   AMLODIPINE (NORVASC) 5 MG TABLET    Take 5 mg by mouth daily.   ASPIRIN 81 MG TABLET    Take 81 mg by mouth daily.   DONEPEZIL (ARICEPT) 10 MG TABLET    Take 10 mg by mouth daily.    LISINOPRIL (PRINIVIL,ZESTRIL) 40 MG TABLET    Take 40 mg by mouth daily. For HTN   METOPROLOL SUCCINATE (TOPROL-XL) 100 MG 24 HR TABLET    Take 100 mg by mouth daily. Take with or immediately  following a meal.   MULTIPLE VITAMINS-MINERALS (CERTAVITE/ANTIOXIDANTS PO)    Take 1 tablet by mouth daily.  Modified Medications   No medications on file  Discontinued Medications   GLIPIZIDE (GLUCOTROL) 5 MG TABLET    Take 2.5 mg by mouth daily before breakfast.     Physical Exam: Physical Exam  Constitutional: She is oriented to person, place, and time. She appears well-developed and well-nourished.  HENT:  Head: Normocephalic and atraumatic.  Right Ear: External ear normal.  Left Ear: External ear normal.  Eyes: Conjunctivae and EOM are normal. Pupils are equal, round,  and reactive to light.  Neck: Neck supple. No JVD present. No thyromegaly present.  Cardiovascular: Normal rate.   Murmur heard.  Systolic murmur is present with a grade of 2/6  Pulmonary/Chest: Effort normal. She has rales (left basilar ).  Abdominal: Soft. Bowel sounds are normal. There is no tenderness.  Musculoskeletal: She exhibits tenderness. She exhibits no edema.  Lower back, localized, aggravated with weight bearing, positional.   Lymphadenopathy:    She has no cervical adenopathy.  Neurological: She is alert and oriented to person, place, and time. She has normal reflexes. No cranial nerve deficit. She exhibits normal muscle tone. Coordination normal.  Skin: Skin is warm and dry. Rash noted. No erythema.  Papular rash left neck.   Psychiatric: She has a normal mood and affect. Her behavior is normal. Judgment and thought content normal. Her speech is not delayed and not slurred. She is not agitated, not aggressive, not hyperactive, not slowed, not withdrawn, not actively hallucinating and not combative. Thought content is not paranoid and not delusional. Cognition and memory are impaired. She exhibits abnormal recent memory.    Filed Vitals:   09/11/13 1332  BP: 140/72  Pulse: 68  Temp: 98.6 F (37 C)  TempSrc: Tympanic  Resp: 18      Labs reviewed: Basic Metabolic Panel:  Recent Labs  01/02/13 0210 01/02/13 0219  NA 135 137  K 3.2* 3.6  CL 97 97  CO2 28  --   GLUCOSE 162* 156*  BUN 15 18  CREATININE 0.83 0.90  CALCIUM 9.4  --    Liver Function Tests:  Recent Labs  01/02/13 0210  AST 18  ALT 17  ALKPHOS 65  BILITOT 0.4  PROT 6.5  ALBUMIN 3.5   CBC:  Recent Labs  01/02/13 0210 01/02/13 0219  WBC 3.2*  --   HGB 12.3 12.6  HCT 35.9* 37.0  MCV 82.5  --   PLT 94*  --    Lipid Panel: No results found for this basename: CHOL, HDL, LDLCALC, TRIG, CHOLHDL, LDLDIRECT,  in the last 8760 hours   Past Procedures:      CT Head Wo Contrast (Final  result)   Result time: 01/01/12 18:46:19    IMPRESSION:  1.  Atrophy and chronic ischemic white matter disease without acute intracranial abnormality. 2.  Midline and left parietal scalp hematoma and contusion.  Assessment/Plan Type II or unspecified type diabetes mellitus with neurological manifestations, not stated as uncontrolled 09/01/13 consultant pharmacist recommendation: dc Glipizide 2.5mg  po qam. FSBS bid in 90s-100s. Last Hgb A1c 5.9 4/20-15. 55yro. Continue Bid FSBS x 1wk-then re-eval. Fasting CBG 97, 106, 107, 103, 99. 97. 103. Dc CBG-Hgb A1c in one monht.   HTN (hypertension) controlled, continue Lisinopril 40mg  and Metoprolol succinate 100mg  and Norvasc 5mg        Family/ Staff Communication: observe the patient.   Goals of Care: AL  Labs/tests  ordered: Hgb A1c

## 2013-09-11 NOTE — Assessment & Plan Note (Signed)
controlled, continue Lisinopril 40mg and Metoprolol succinate 100mg and Norvasc 5mg  

## 2013-10-13 ENCOUNTER — Other Ambulatory Visit: Payer: Self-pay | Admitting: Nurse Practitioner

## 2013-10-13 DIAGNOSIS — E1149 Type 2 diabetes mellitus with other diabetic neurological complication: Secondary | ICD-10-CM

## 2013-10-13 LAB — HEMOGLOBIN A1C: Hgb A1c MFr Bld: 7 % — AB (ref 4.0–6.0)

## 2013-10-27 ENCOUNTER — Encounter: Payer: Self-pay | Admitting: Internal Medicine

## 2013-10-27 ENCOUNTER — Non-Acute Institutional Stay: Payer: Medicare Other | Admitting: Internal Medicine

## 2013-10-27 VITALS — BP 164/78 | HR 64 | Temp 97.3°F | Ht 59.0 in | Wt 134.0 lb

## 2013-10-27 DIAGNOSIS — F329 Major depressive disorder, single episode, unspecified: Secondary | ICD-10-CM

## 2013-10-27 DIAGNOSIS — E1142 Type 2 diabetes mellitus with diabetic polyneuropathy: Secondary | ICD-10-CM

## 2013-10-27 DIAGNOSIS — M4120 Other idiopathic scoliosis, site unspecified: Secondary | ICD-10-CM | POA: Insufficient documentation

## 2013-10-27 DIAGNOSIS — Z418 Encounter for other procedures for purposes other than remedying health state: Secondary | ICD-10-CM

## 2013-10-27 DIAGNOSIS — F411 Generalized anxiety disorder: Secondary | ICD-10-CM

## 2013-10-27 DIAGNOSIS — I10 Essential (primary) hypertension: Secondary | ICD-10-CM

## 2013-10-27 DIAGNOSIS — E1140 Type 2 diabetes mellitus with diabetic neuropathy, unspecified: Secondary | ICD-10-CM

## 2013-10-27 DIAGNOSIS — F32A Depression, unspecified: Secondary | ICD-10-CM

## 2013-10-27 DIAGNOSIS — Z299 Encounter for prophylactic measures, unspecified: Secondary | ICD-10-CM

## 2013-10-27 DIAGNOSIS — R269 Unspecified abnormalities of gait and mobility: Secondary | ICD-10-CM

## 2013-10-27 DIAGNOSIS — F0390 Unspecified dementia without behavioral disturbance: Secondary | ICD-10-CM

## 2013-10-27 NOTE — Progress Notes (Signed)
Patient ID: Teresa Richards, female   DOB: [birth year], 80 y.o.   MRN: [id number]    HISTORY AND PHYSICAL  Location:  [place] Room Number: [place] of Service: Clinic [phone])   Extended Emergency Contact Information Primary Emergency Contact: Hanzaker,[name] Address: [address]          [place], [place] [address] Johnnette Litter of [place] Phone: [phone] Mobile Phone: [phone] Relation: Daughter Secondary Emergency Contact: [place] [address] Johnnette Litter of [place] Phone: [phone] Relation: Grandson   Chief Complaint  Patient presents with  . Medical Management of Chronic Issues    Comprehensive exam: blood pressure, blood sugar, dementia, depression    HPI:  Preventive measure - Plan: DNR (Do Not Resuscitate)  Diabetic polyneuropathy associated with type 2 diabetes mellitus: unchanged  Type 2 diabetes, controlled, with neuropathy: stable  Essential hypertension: controlled  Depression: resolved  Dementia, without behavioral disturbance: unchanged  Anxiety state: improved  Abnormality of gait: using cane and wheelchair    Past Medical History  Diagnosis Date  . Diabetes mellitus without complication   . Hypertension   . Melanoma 1960    left shoulder skin  . Dementia   . Peripheral neuropathy   . Glaucoma   . Atrial fibrillation   . Bradycardia   . Umbilical hernia   . Lower back pain   . Muscle pain     Past Surgical History  Procedure Laterality Date  . Skin cancer excision Left 1960's    melanoma shoulder    History   Social History  . Marital Status: Married    Spouse Name: N/A    Number of Children: 1  . Years of Education: 12   Occupational History  . retired Network engineer    Social History Main Topics  . Smoking status: Never Smoker   . Smokeless tobacco: Never Used  . Alcohol Use: No     Comment: Patient drinks coffee occasional.  . Drug Use: No  . Sexual  Activity: No   Other Topics Concern  . Not on file   Social History Narrative   Lives at [place]    Widowed    Has MOST form signed   DNR, POA, Living Will   Walks with walker   Exercise: once a day does legs and arms       reports that she has never smoked. She has never used smokeless tobacco. She reports that she does not drink alcohol or use illicit drugs.  Immunization History  Administered Date(s) Administered  . Influenza-Unspecified 11/10/2012  . PPD Test 11/17/2011  . Pneumococcal Polysaccharide-23 11/23/1999  . Td 10/22/2000    Allergies  Allergen Reactions  . Codeine Other (See Comments)    Per [facility]    Medications: [name] Medications  New Prescriptions   No medications on file  Previous Medications   ACETAMINOPHEN (TYLENOL) 325 MG TABLET    Take 650 mg by mouth every 4 (four) hours as needed for moderate pain.   ASPIRIN 81 MG TABLET    Take 81 mg by mouth daily.   DONEPEZIL (ARICEPT) 10 MG TABLET    Take 10 mg by mouth daily.    LISINOPRIL (PRINIVIL,ZESTRIL) 40 MG TABLET    Take 40 mg by mouth daily. For HTN   METOPROLOL SUCCINATE (TOPROL-XL) 100 MG 24 HR TABLET    Take 100 mg by mouth daily. Take with or immediately  following a meal.   MULTIPLE VITAMINS-MINERALS (CERTAVITE/ANTIOXIDANTS PO)    Take 1 tablet by mouth daily.  Modified Medications   No medications on file  Discontinued Medications   AMLODIPINE (NORVASC) 5 MG TABLET    Take 5 mg by mouth daily.     Review of Systems  Constitutional: Positive for fatigue.  HENT: Negative.   Eyes: Positive for visual disturbance (corrective lenses).  Respiratory: Negative.   Gastrointestinal: Negative.   Endocrine: Positive for polyuria.       Diabetic with neuropathy  Genitourinary: Positive for frequency. Negative for dysuria and flank pain.  Musculoskeletal: Positive for gait problem.  Skin: Negative.   Allergic/Immunologic: Negative.   Neurological: Negative for tremors, seizures, syncope, facial  asymmetry, speech difficulty, weakness, light-headedness, numbness and headaches.       Dementia   Psychiatric/Behavioral: Positive for sleep disturbance.    Filed Vitals:   10/27/13 1009  BP: 164/78  Pulse: 64  Temp: 97.3 F (36.3 C)  TempSrc: Oral  Height: 4\' 11"  (1.499 m)  Weight: 134 lb (60.782 kg)  SpO2: 97%   Body mass index is 27.05 kg/(m^2).  Physical Exam  Constitutional: She is oriented to person, place, and time. She appears well-developed and well-nourished.  HENT:  Head: Normocephalic and atraumatic.  Right Ear: External ear normal.  Left Ear: External ear normal.  Eyes: Conjunctivae and EOM are normal. Pupils are equal, round, and reactive to light.  Neck: Neck supple. No JVD present. No tracheal deviation present. No thyromegaly present.  Cardiovascular: Normal rate.   Murmur heard.  Systolic murmur is present with a grade of 2/6  Pulmonary/Chest: Effort normal and breath sounds normal. No respiratory distress. She has no rales.  Abdominal: Soft. Bowel sounds are normal. She exhibits no mass. There is no tenderness.  Musculoskeletal: She exhibits no edema and no tenderness.  Unstable gait. Using rolling walker. Mild scoliosis of thoracic and lumbar spine.  Lymphadenopathy:    She has no cervical adenopathy.  Neurological: She is alert and oriented to person, place, and time. She has normal reflexes. No cranial nerve deficit. She exhibits normal muscle tone. Coordination normal.  Demented  Skin: Skin is warm and dry. No rash noted. No erythema.  Papular rash left neck.   Psychiatric: She has a normal mood and affect. Her behavior is normal. Judgment and thought content normal. Her speech is not delayed and not slurred. She is not agitated, not aggressive, not hyperactive, not slowed, not withdrawn, not actively hallucinating and not combative. Thought content is not paranoid and not delusional. Cognition and memory are impaired. She exhibits abnormal recent  memory.     Labs reviewed: Lab on 10/13/2013  Component Date Value Ref Range Status  . Hemoglobin A1C 10/13/2013 7.0* 4.0 - 6.0 % Final     Assessment/Plan  1. Preventive measure - DNR (Do Not Resuscitate)  2. Diabetic polyneuropathy associated with type 2 diabetes mellitus unchanged  3. Type 2 diabetes, controlled, with neuropathy controlled  4. Essential hypertension controlled  5. Depression resolved  6. Dementia, without behavioral disturbance unchanged  7. Anxiety state improved  8. Abnormality of gait Unchanged. Unstable. Uses walker and wheelchair  9. Idiopathic scoliosis observe

## 2014-01-12 ENCOUNTER — Encounter: Payer: Medicare Other | Admitting: Internal Medicine

## 2014-01-19 ENCOUNTER — Non-Acute Institutional Stay: Payer: Medicare Other | Admitting: Nurse Practitioner

## 2014-01-19 DIAGNOSIS — I10 Essential (primary) hypertension: Secondary | ICD-10-CM

## 2014-01-19 DIAGNOSIS — J209 Acute bronchitis, unspecified: Secondary | ICD-10-CM | POA: Insufficient documentation

## 2014-01-19 DIAGNOSIS — E0842 Diabetes mellitus due to underlying condition with diabetic polyneuropathy: Secondary | ICD-10-CM

## 2014-01-19 DIAGNOSIS — F329 Major depressive disorder, single episode, unspecified: Secondary | ICD-10-CM

## 2014-01-19 DIAGNOSIS — F32A Depression, unspecified: Secondary | ICD-10-CM

## 2014-01-19 DIAGNOSIS — J208 Acute bronchitis due to other specified organisms: Secondary | ICD-10-CM

## 2014-01-19 DIAGNOSIS — R269 Unspecified abnormalities of gait and mobility: Secondary | ICD-10-CM

## 2014-01-19 DIAGNOSIS — M5440 Lumbago with sciatica, unspecified side: Secondary | ICD-10-CM

## 2014-01-19 DIAGNOSIS — E1140 Type 2 diabetes mellitus with diabetic neuropathy, unspecified: Secondary | ICD-10-CM

## 2014-01-19 DIAGNOSIS — F0390 Unspecified dementia without behavioral disturbance: Secondary | ICD-10-CM

## 2014-01-19 DIAGNOSIS — N318 Other neuromuscular dysfunction of bladder: Secondary | ICD-10-CM

## 2014-01-19 NOTE — Assessment & Plan Note (Signed)
Worse since her lower back pain. Ambulates with walker for short distance and w/c to go further. Intensive supervision needed for safety.

## 2014-01-19 NOTE — Assessment & Plan Note (Signed)
controlled, continue Lisinopril 40mg  and Metoprolol succinate 100mg  and Norvasc 5mg 

## 2014-01-19 NOTE — Assessment & Plan Note (Signed)
MMSE 22/30 05/29/12--Aricept started and tolerated. Functioning well in AL

## 2014-01-19 NOTE — Assessment & Plan Note (Signed)
ncreased lower body weakness. Denied pain or tingling or numbness today.

## 2014-01-19 NOTE — Progress Notes (Signed)
Patient ID: Teresa Richards, female   DOB: 1918-11-15, 80 y.o.   MRN: [id number]   Code Status: DNR  Allergies  Allergen Reactions  . Codeine Other (See Comments)    [facility]    Chief Complaint  Patient presents with  . Medical Management of Chronic Issues  . Acute Visit    fall, cough.     HPI: Patient is a 80 y.o. female seen in the AL at [facility] today for  evaluation of cough, T, falling,  and  chronic medical conditions Problem List Items Addressed This Visit    Type 2 diabetes, controlled, with neuropathy (Chronic)    09/01/13 consultant pharmacist recommendation: dc Glipizide 2.5mg  po qam. FSBS bid in 90s-100s. Last Hgb A1c 5.9 4/20-15. 76yro. Continue Bid FSBS x 1wk-then re-eval. Fasting CBG 97, 106, 107, 103, 99. 97. 103. Dc CBG-Hgb A1c in one monht.  10/13/13 Hgb 7.0-fasting CBG 2x/week.     Lumbago    he patient stated it has been on and off in the past. Off Celebrex-observe the patient.       Hypertonicity of bladder    No c/o today, off Myrbetriq      HTN (hypertension) (Chronic)    controlled, continue Lisinopril 40mg  and Metoprolol succinate 100mg  and Norvasc 5mg      Diabetic neuropathy (Chronic)    ncreased lower body weakness. Denied pain or tingling or numbness today.      Depression (Chronic)    Flat affect as usual.     Dementia (Chronic)    MMSE 22/30 05/29/12--Aricept started and tolerated. Functioning well in AL      Acute bronchitis - Primary    01/18/14 CXR small bibasilar airspace disease, likely atelectasis versus early infiltration. Lower grade T and s/p fall.  DuoNeb tid x3 then prn. Complete 7 day course of Augmentin 875mg  bid started 01/08/14. O2 [place] to maintain O2 Sat >89%.     Abnormality of gait (Chronic)    Worse since her lower back pain. Ambulates with walker for short distance and w/c to go further. Intensive supervision needed for safety.          Review of Systems:  Review of Systems  Constitutional: Positive for  fever. Negative for chills, weight loss and malaise/fatigue.  HENT: Positive for hearing loss. Negative for congestion, ear discharge, ear pain, nosebleeds and sore throat.   Eyes: Negative for pain, discharge and redness.  Respiratory: Positive for cough. Negative for sputum production and wheezing.   Cardiovascular: Negative for chest pain, palpitations, orthopnea, claudication, leg swelling and PND.  Gastrointestinal: Negative for heartburn, nausea, vomiting, diarrhea and constipation.       Explosive loose stools resolved after Metformin discontinued.   Genitourinary: Positive for frequency. Negative for dysuria and urgency.  Musculoskeletal: Positive for back pain and falls. Negative for myalgias, joint pain and neck pain.       Lower back pain worse-rides w/c more for mobility.  01/16/14 found sitting on the floor.   Skin: Negative for itching and rash.  Neurological: Negative for dizziness, tingling, tremors, speech change, focal weakness, seizures, loss of consciousness, weakness and headaches.       Unsteady gait.   Endo/Heme/Allergies: Negative for environmental allergies and polydipsia. Bruises/bleeds easily.  Psychiatric/Behavioral: Positive for memory loss. Negative for depression and hallucinations. The patient does not have insomnia.      Past Medical History  Diagnosis Date  . Diabetes mellitus without complication   . Hypertension   . Melanoma  1960    left shoulder skin  . Dementia   . Peripheral neuropathy   . Glaucoma   . Atrial fibrillation   . Bradycardia   . Umbilical hernia   . Lower back pain   . Muscle pain    Social History:   reports that she has never smoked. She has never used smokeless tobacco. She reports that she does not drink alcohol or use illicit drugs.  Family History  Problem Relation Age of Onset  . Leukemia Father     Medications: [name] Medications  New Prescriptions   No medications on file  Previous Medications    ACETAMINOPHEN (TYLENOL) 325 MG TABLET    Take 650 mg by mouth every 4 (four) hours as needed for moderate pain.   ASPIRIN 81 MG TABLET    Take 81 mg by mouth daily.   DONEPEZIL (ARICEPT) 10 MG TABLET    Take 10 mg by mouth daily.    LISINOPRIL (PRINIVIL,ZESTRIL) 40 MG TABLET    Take 40 mg by mouth daily. For HTN   METOPROLOL SUCCINATE (TOPROL-XL) 100 MG 24 HR TABLET    Take 100 mg by mouth daily. Take with or immediately following a meal.   MULTIPLE VITAMINS-MINERALS (CERTAVITE/ANTIOXIDANTS PO)    Take 1 tablet by mouth daily.  Modified Medications   No medications on file  Discontinued Medications   No medications on file     Physical Exam: Physical Exam  Constitutional: She is oriented to person, place, and time. She appears well-developed and well-nourished.  HENT:  Head: Normocephalic and atraumatic.  Right Ear: External ear normal.  Left Ear: External ear normal.  Eyes: Conjunctivae and EOM are normal. Pupils are equal, round, and reactive to light.  Neck: Neck supple. No JVD present. No thyromegaly present.  Cardiovascular: Normal rate.   Murmur heard.  Systolic murmur is present with a grade of 2/6  Pulmonary/Chest: Effort normal. She has rales (left basilar ).  Moist rales posterior mid to lower lungs  Abdominal: Soft. Bowel sounds are normal. There is no tenderness.  Musculoskeletal: She exhibits tenderness. She exhibits no edema.  Lower back, localized, aggravated with weight bearing, positional.   Lymphadenopathy:    She has no cervical adenopathy.  Neurological: She is alert and oriented to person, place, and time. She has normal reflexes. No cranial nerve deficit. She exhibits normal muscle tone. Coordination normal.  Skin: Skin is warm and dry. No rash noted. No erythema.  Psychiatric: She has a normal mood and affect. Her behavior is normal. Judgment and thought content normal. Her speech is not delayed and not slurred. She is not agitated, not aggressive, not  hyperactive, not slowed, not withdrawn, not actively hallucinating and not combative. Thought content is not paranoid and not delusional. Cognition and memory are impaired. She exhibits abnormal recent memory.    Filed Vitals:   01/19/14 1335  BP: 130/66  Pulse: 74  Temp: 100.1 F (37.8 C)  TempSrc: Tympanic  Resp: 18      Labs reviewed: Basic Metabolic Panel: No results for input(s): NA, K, CL, CO2, GLUCOSE, BUN, CREATININE, CALCIUM, MG, PHOS, TSH in the last 8760 hours. Liver Function Tests: No results for input(s): AST, ALT, ALKPHOS, BILITOT, PROT, ALBUMIN in the last 8760 hours. CBC: No results for input(s): WBC, NEUTROABS, HGB, HCT, MCV, PLT in the last 8760 hours. Lipid Panel: No results for input(s): CHOL, HDL, LDLCALC, TRIG, CHOLHDL, LDLDIRECT in the last 8760 hours.   Past Procedures:  CT Head Wo Contrast (Final result)   Result time: 01/01/12 18:46:19    IMPRESSION:  1.  Atrophy and chronic ischemic white matter disease without acute intracranial abnormality. 2.  Midline and left parietal scalp hematoma and contusion.  Assessment/Plan Acute bronchitis 01/18/14 CXR small bibasilar airspace disease, likely atelectasis versus early infiltration. Lower grade T and s/p fall.  DuoNeb tid x3 then prn. Complete 7 day course of Augmentin 875mg  bid started 01/08/14. O2 [place] to maintain O2 Sat >89%.   Lumbago he patient stated it has been on and off in the past. Off Celebrex-observe the patient.     Type 2 diabetes, controlled, with neuropathy 09/01/13 consultant pharmacist recommendation: dc Glipizide 2.5mg  po qam. FSBS bid in 90s-100s. Last Hgb A1c 5.9 4/20-15. 69yro. Continue Bid FSBS x 1wk-then re-eval. Fasting CBG 97, 106, 107, 103, 99. 97. 103. Dc CBG-Hgb A1c in one monht.  10/13/13 Hgb 7.0-fasting CBG 2x/week.   Hypertonicity of bladder No c/o today, off Myrbetriq    HTN (hypertension) controlled, continue Lisinopril 40mg  and Metoprolol succinate 100mg   and Norvasc 5mg    Diabetic neuropathy ncreased lower body weakness. Denied pain or tingling or numbness today.    Depression Flat affect as usual.   Dementia MMSE 22/30 05/29/12--Aricept started and tolerated. Functioning well in AL    Abnormality of gait Worse since her lower back pain. Ambulates with walker for short distance and w/c to go further. Intensive supervision needed for safety.       Family/ Staff Communication: observe the patient.   Goals of Care: AL  Labs/tests ordered: Urine culture pending. CXR done 01/18/14

## 2014-01-19 NOTE — Assessment & Plan Note (Signed)
01/18/14 CXR small bibasilar airspace disease, likely atelectasis versus early infiltration. Lower grade T and s/p fall.  DuoNeb tid x3 then prn. Complete 7 day course of Augmentin 875mg  bid started 01/08/14. O2 [place] to maintain O2 Sat >89%.

## 2014-01-19 NOTE — Assessment & Plan Note (Signed)
Flat affect as usual.

## 2014-01-19 NOTE — Assessment & Plan Note (Signed)
No c/o today, off Myrbetriq

## 2014-01-19 NOTE — Assessment & Plan Note (Signed)
09/01/13 consultant pharmacist recommendation: dc Glipizide 2.5mg  po qam. FSBS bid in 90s-100s. Last Hgb A1c 5.9 4/20-15. 46yro. Continue Bid FSBS x 1wk-then re-eval. Fasting CBG 97, 106, 107, 103, 99. 97. 103. Dc CBG-Hgb A1c in one monht.  10/13/13 Hgb 7.0-fasting CBG 2x/week.

## 2014-01-19 NOTE — Assessment & Plan Note (Signed)
he patient stated it has been on and off in the past. Off Celebrex-observe the patient.

## 2014-08-10 ENCOUNTER — Encounter: Payer: Self-pay | Admitting: *Deleted

## 2014-09-17 ENCOUNTER — Encounter: Payer: Self-pay | Admitting: Cardiovascular Disease

## 2014-10-19 IMAGING — CT CT HEAD W/O CM
5 of 11 series · 15 of 47 positions shown, 16 images · non-contrast
Comparison: 01/01/2012 and 08/08/2007 CTs

CT HEAD

CLINICAL DATA: [AGE] female with head, face and neck injury
following fall.  Headache, neck pain and facial pain and swelling.
Altered mental status and confusion.

CT HEAD WITHOUT CONTRAST
CT MAXILLOFACIAL WITHOUT CONTRAST
CT CERVICAL SPINE WITHOUT CONTRAST
TECHNIQUE: Multidetector CT imaging of the head, cervical spine,
and maxillofacial structures were performed using the standard
protocol without intravenous contrast. Multiplanar CT image
reconstructions of the cervical spine and maxillofacial structures
were also generated.

[Series 3: recon 2: brain · axial · 0.47mm/px · z∈[-81,-0]mm · 3 of 64 slices shown]
[im 16/64  brain]
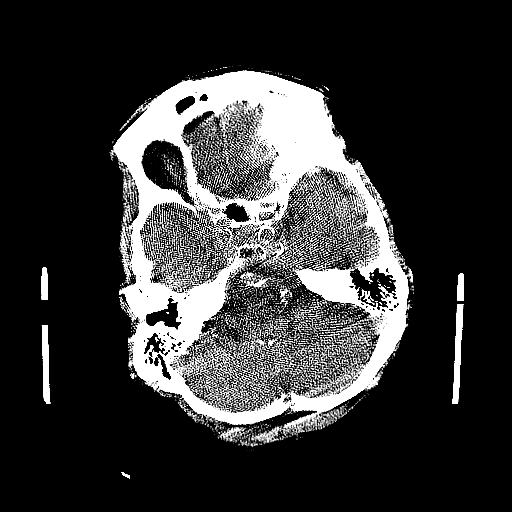
[im 32/64  brain]
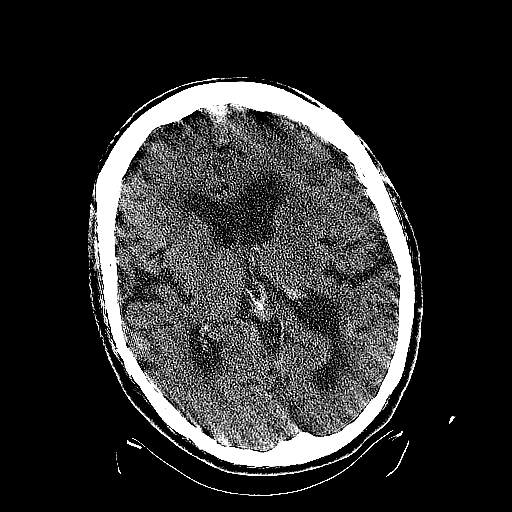
[im 48/64  brain]
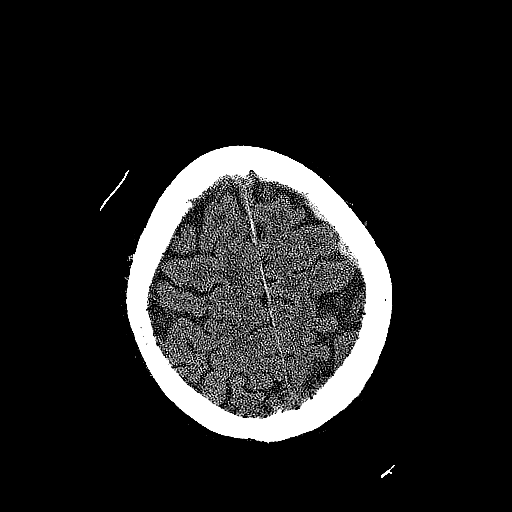

[Series 10: recon 2: c-spine · axial · 0.28mm/px · z∈[-216,-166]mm · 2 of 62 slices shown]
[im 21/62  brain]
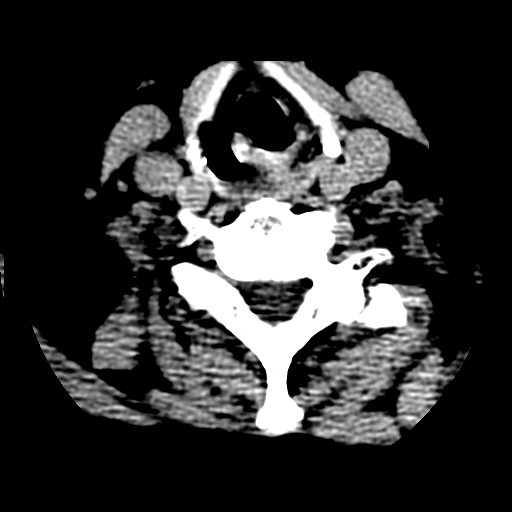
[im 41/62  brain]
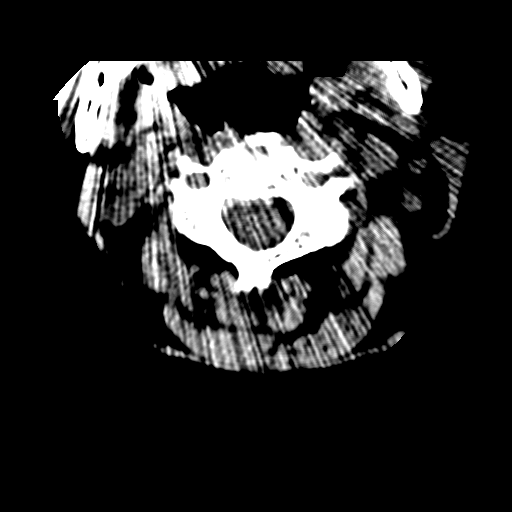

[Series 107: sag · sagittal · 0.36mm/px · 3 of 76 slices shown]
[im 19/76  brain]
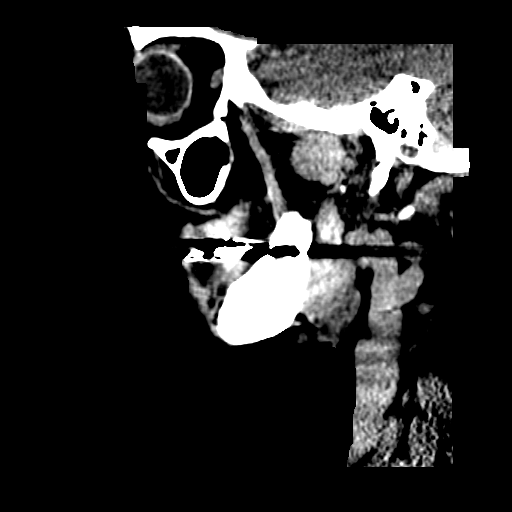
[im 38/76  brain]
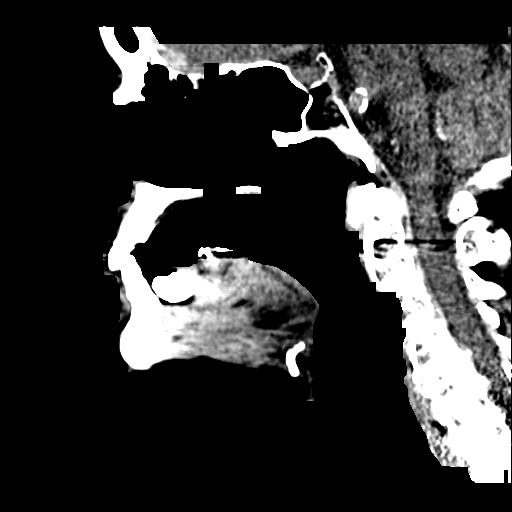
[im 57/76  brain]
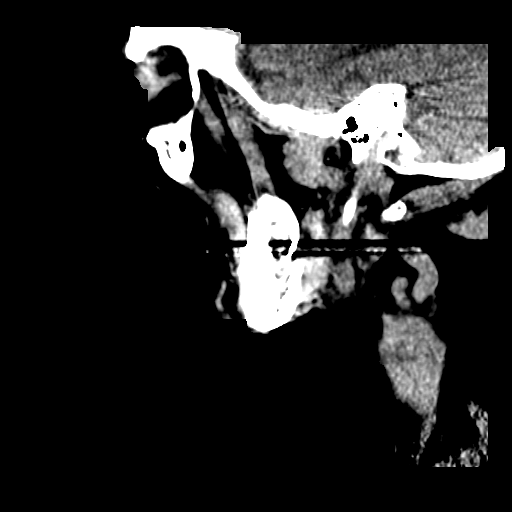

[Series 1101: cor · coronal · 0.31mm/px · 3 of 34 slices shown]
[im 12/34  brain]
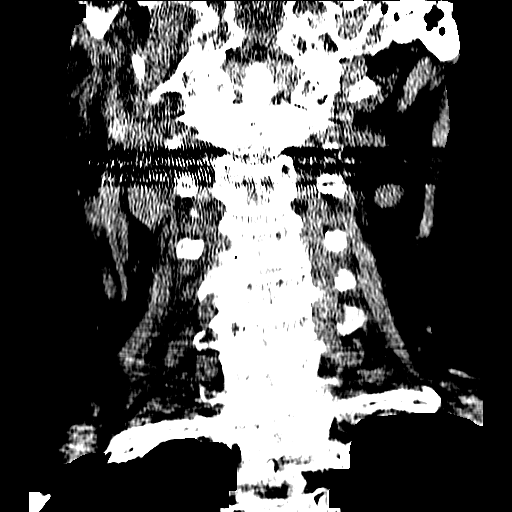
[im 15/34  brain]
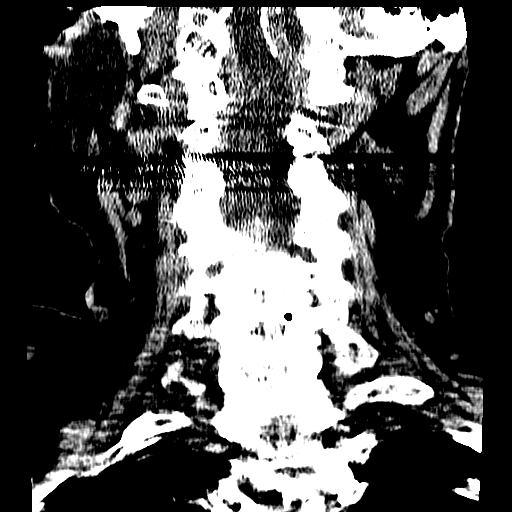
[im 19/34  brain]
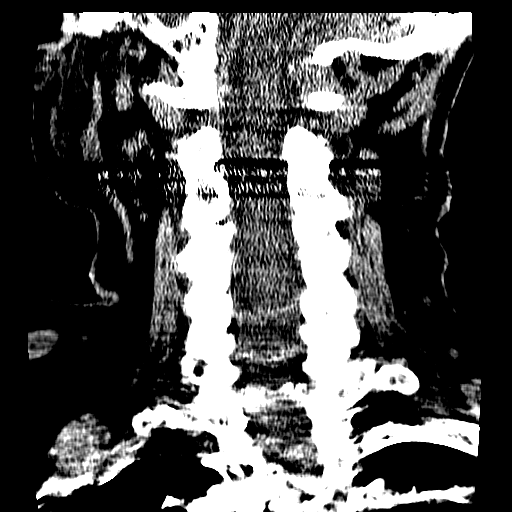

[Series 1102: axials · axial · 0.31mm/px · z∈[-264,-178]mm · 4 of 82 slices shown, 5 images]
[im 17/82  brain]
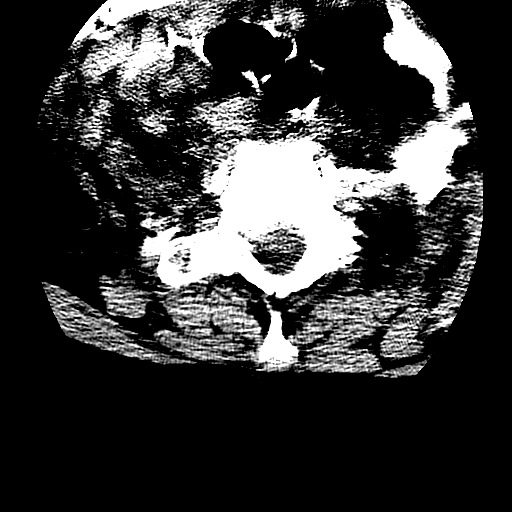
[im 17/82  bone]
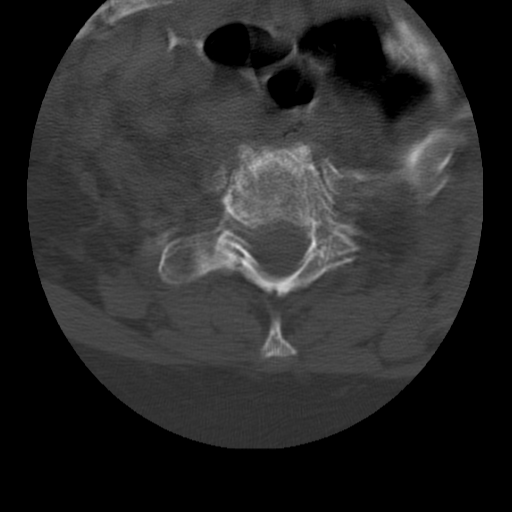
[im 33/82  brain]
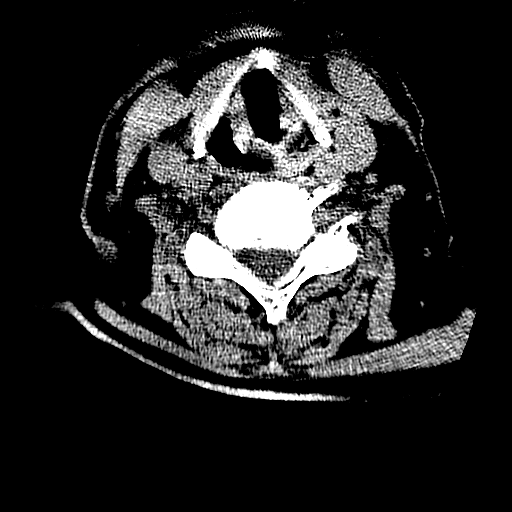
[im 49/82  brain]
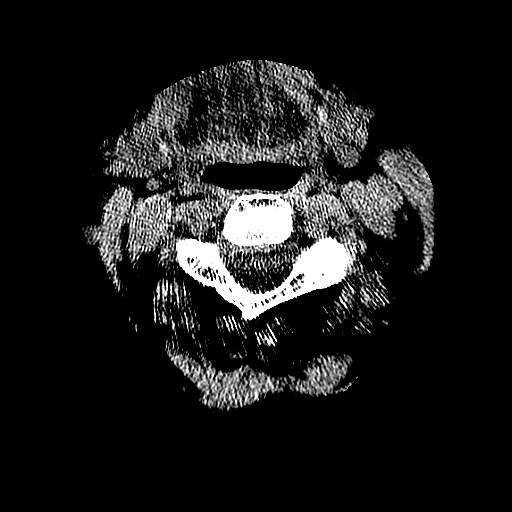
[im 65/82  brain]
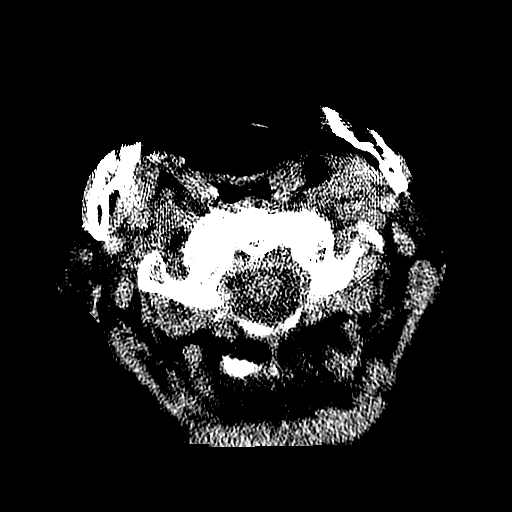

[15 of 47 positions shown; findings below may reference images not displayed]

FINDINGS: A left frontotemporal/anterior left parafalcine subdural
hematoma is identified measuring 5 mm in greatest diameter. 2 mm
left to right midline shift is noted.

Chronic small vessel white matter ischemic changes are noted with
heavy vascular calcifications.

There is no evidence of acute infarction, hydrocephalus or mass
lesion.
No acute or suspicious bony abnormalities are noted.
IMPRESSION: Left frontotemporal/anterior left parafalcine subdural hematoma
measuring 5 mm in greatest diameter with 2 mm left to right midline
shift.

Chronic small vessel white matter ischemic changes.

CT MAXILLOFACIAL
FINDINGS: Left facial soft tissue swelling is identified.
There is no evidence of acute fracture, subluxation or dislocation.
The paranasal sinuses, mastoid air cells and middle/.
The orbits and globes are unremarkable.
No focal bony lesions are present.
IMPRESSION: Left facial soft tissue swelling without fracture.

CT CERVICAL SPINE
FINDINGS: Motion artifact degrades images at the C7-T1
articulation.

There is no evidence of acute fracture or subluxation.
No prevertebral soft tissue swelling is noted.
Moderate degenerative disc disease and spondylosis from C4-T1
noted.
Mild to moderate facet arthropathy throughout the cervical spine
identified.
No soft tissue abnormalities are identified.
IMPRESSION: No static evidence of acute injury to the spine.  Please note that
motion artifact slightly limits evaluation.

Multilevel degenerative changes.

Critical Value/emergent results were called by telephone at the
time of interpretation on 03/08/2012 at [DATE] p.m. to Dr. Rajaul,
who verbally acknowledged these results.

## 2014-10-26 ENCOUNTER — Non-Acute Institutional Stay: Payer: Medicare Other | Admitting: Internal Medicine

## 2014-10-26 ENCOUNTER — Encounter: Payer: Self-pay | Admitting: Internal Medicine

## 2014-10-26 VITALS — BP 120/70 | HR 67 | Temp 97.4°F | Resp 20 | Wt 127.2 lb

## 2014-10-26 DIAGNOSIS — I10 Essential (primary) hypertension: Secondary | ICD-10-CM

## 2014-10-26 DIAGNOSIS — E0842 Diabetes mellitus due to underlying condition with diabetic polyneuropathy: Secondary | ICD-10-CM

## 2014-10-26 DIAGNOSIS — F0390 Unspecified dementia without behavioral disturbance: Secondary | ICD-10-CM | POA: Diagnosis not present

## 2014-10-26 DIAGNOSIS — F329 Major depressive disorder, single episode, unspecified: Secondary | ICD-10-CM | POA: Diagnosis not present

## 2014-10-26 DIAGNOSIS — R7989 Other specified abnormal findings of blood chemistry: Secondary | ICD-10-CM | POA: Diagnosis not present

## 2014-10-26 DIAGNOSIS — E1140 Type 2 diabetes mellitus with diabetic neuropathy, unspecified: Secondary | ICD-10-CM | POA: Diagnosis not present

## 2014-10-26 DIAGNOSIS — F32A Depression, unspecified: Secondary | ICD-10-CM

## 2014-10-26 DIAGNOSIS — R269 Unspecified abnormalities of gait and mobility: Secondary | ICD-10-CM | POA: Diagnosis not present

## 2014-10-26 NOTE — Progress Notes (Signed)
Patient ID: Teresa Richards, female   DOB: 1918/07/22, 81 y.o.   MRN: [id number]    HISTORY AND PHYSICAL  Location:  [place] Room Number: [place] of Service: Clinic [phone])   Extended Emergency Contact Information Primary Emergency Contact: Hanzaker,[name] Address: [address]          [place], [place] [address] Johnnette Litter of [place] Phone: [phone] Mobile Phone: [phone] Relation: Daughter Secondary Emergency Contact: [place] [address] [place] of [place] Phone: [phone] Relation: Grandson  Advanced Directive information Does patient have an advance directive?: Yes, Type of Advance Directive: Healthcare Power of [place];Living will;Out of facility DNR (pink MOST or yellow form), Pre-existing out of facility DNR order (yellow form or pink MOST form): Yellow form placed in chart (order not valid for inpatient use);Pink MOST form placed in chart (order not valid for inpatient use), Does patient want to make changes to advanced directive?: No - Patient declined  Chief Complaint  Patient presents with  . Medical Management of Chronic Issues    HPI:  Elderly female residing in assisted living at [facility] presents today for annual physical. She says "I am fine" to every question.  Dementia, without behavioral disturbance - continues to progress. Currently on donepezil.  Essential hypertension - controlled  Diabetic polyneuropathy associated with diabetes mellitus due to underlying condition ([place]) - unchanged  Type 2 diabetes, controlled, with neuropathy ([place]) - controlled  Abnormality of gait - using rolling walker  Depression - denies sadness. Resolved.    Past Medical History  Diagnosis Date  . Diabetes mellitus without complication ([place])   . Hypertension   . Melanoma ([place]) 1960    left shoulder skin  . Dementia   . Peripheral neuropathy ([place])   . Glaucoma   . Atrial fibrillation  ([place])   . Bradycardia   . Umbilical hernia   . Lower back pain   . Muscle pain     Past Surgical History  Procedure Laterality Date  . Skin cancer excision Left 1960's    melanoma shoulder    Patient Care Team: Estill Dooms, MD as PCP - General (Internal Medicine)  Social History   Social History  . Marital Status: Married    Spouse Name: N/A  . Number of Children: 1  . Years of Education: 12   Occupational History  . retired Network engineer    Social History Main Topics  . Smoking status: Never Smoker   . Smokeless tobacco: Never Used  . Alcohol Use: No     Comment: Patient drinks coffee occasional.  . Drug Use: No  . Sexual Activity: No   Other Topics Concern  . Not on file   Social History Narrative   Lives at [facility]    Widowed    Has MOST form signed   DNR, POA, Living Will   Walks with walker   Exercise: once a day does legs and arms      reports that she has never smoked. She has never used smokeless tobacco. She reports that she does not drink alcohol or use illicit drugs.  Family History  Problem Relation Age of Onset  . Leukemia Father   . Heart disease Brother    Family Status  Relation Status Death Age  . Father Deceased   . Mother Deceased   . Daughter Alive   . Sister Alive   .  Brother Deceased   . Brother Alive   . Brother Alive   . Brother Deceased     Cause of death unknown  . Sister Alive     Immunization History  Administered Date(s) Administered  . Influenza-Unspecified 11/10/2012, 10/27/2013  . PPD Test 11/17/2011  . Pneumococcal Polysaccharide-23 11/23/1999  . Td 10/22/2000    Allergies  Allergen Reactions  . Codeine Other (See Comments)    Per [facility]    Medications: [name] Medications  New Prescriptions   No medications on file  Previous Medications   ACETAMINOPHEN (TYLENOL) 325 MG TABLET    Take 650 mg by mouth every 4 (four) hours as needed for moderate pain.   ASPIRIN 81 MG TABLET    Take 81 mg by mouth daily.    DONEPEZIL (ARICEPT) 10 MG TABLET    Take 10 mg by mouth daily.    LISINOPRIL (PRINIVIL,ZESTRIL) 40 MG TABLET    Take 40 mg by mouth daily. For HTN   METOPROLOL SUCCINATE (TOPROL-XL) 100 MG 24 HR TABLET    Take 100 mg by mouth daily. Take with or immediately following a meal.   MULTIPLE VITAMINS-MINERALS (CERTAVITE/ANTIOXIDANTS PO)    Take 1 tablet by mouth daily.  Modified Medications   No medications on file  Discontinued Medications   No medications on file    Review of Systems  Constitutional: Positive for fatigue.  HENT: Negative.   Eyes: Positive for visual disturbance (corrective lenses).  Respiratory: Negative.   Gastrointestinal: Negative.   Endocrine: Positive for polyuria.       Diabetic with neuropathy  Genitourinary: Positive for frequency. Negative for dysuria and flank pain.  Musculoskeletal: Positive for gait problem.  Skin: Negative.   Allergic/Immunologic: Negative.   Neurological: Negative for tremors, seizures, syncope, facial asymmetry, speech difficulty, weakness, light-headedness, numbness and headaches.       Dementia  Psychiatric/Behavioral: Positive for sleep disturbance.    Filed Vitals:   10/26/14 1148  BP: 120/70  Pulse: 67  Temp: 97.4 F (36.3 C)  TempSrc: Oral  Resp: 20  Weight: 127 lb 3.2 oz (57.698 kg)  SpO2: 97%   Body mass index is 25.68 kg/(m^2).  Physical Exam  Constitutional: She is oriented to person, place, and time. She appears well-developed and well-nourished.  HENT:  Head: Normocephalic and atraumatic.  Right Ear: External ear normal.  Left Ear: External ear normal.  Eyes: Conjunctivae and EOM are normal. Pupils are equal, round, and reactive to light.  Neck: Neck supple. No JVD present. No tracheal deviation present. No thyromegaly present.  Cardiovascular: Normal rate.   Murmur heard.  Systolic murmur is present with a grade of 2/6  Pulmonary/Chest: Effort normal and breath sounds normal. No respiratory distress. She  has no rales.  Abdominal: Soft. Bowel sounds are normal. She exhibits no mass. There is no tenderness.  Musculoskeletal: She exhibits no edema or tenderness.  Unstable gait. Using rolling walker. Mild scoliosis of thoracic and lumbar spine.  Lymphadenopathy:    She has no cervical adenopathy.  Neurological: She is alert and oriented to person, place, and time. She has normal reflexes. No cranial nerve deficit. She exhibits normal muscle tone. Coordination normal.  Demented  Skin: Skin is warm and dry. No rash noted. No erythema.  Psychiatric: She has a normal mood and affect. Her behavior is normal. Judgment and thought content normal. Her speech is not delayed and not slurred. She is not agitated, not aggressive, not hyperactive, not slowed, not withdrawn, not actively  hallucinating and not combative. Thought content is not paranoid and not delusional. Cognition and memory are impaired. She exhibits abnormal recent memory.    Labs reviewed: Lab Summary Latest Ref Rng 01/02/2013 01/02/2013 09/11/2012 08/28/2012 07/07/2012  Hemoglobin 12.0 - 15.0 g/dL 12.6 12.3 (None) (None) (None)  Hematocrit 36.0 - 46.0 % 37.0 35.9(L) (None) (None) (None)  White count 4.0 - 10.5 K/uL (None) 3.2(L) (None) (None) (None)  Platelet count 150 - 400 K/uL (None) 94(L) (None) (None) (None)  Sodium 135 - 145 mEq/L 137 135 139 140 138  Potassium 3.5 - 5.1 mEq/L 3.6 3.2(L) 3.6 4.2 4.2  Calcium 8.4 - 10.5 mg/dL (None) 9.4 (None) (None) (None)  Phosphorus - (None) (None) (None) (None) (None)  Creatinine 0.50 - 1.10 mg/dL 0.90 0.83 0.7 0.6 0.7  AST 0 - 37 U/L (None) 18 (None) (None) (None)  Alk Phos 39 - 117 U/L (None) 65 (None) (None) (None)  Bilirubin 0.3 - 1.2 mg/dL (None) 0.4 (None) (None) (None)  Glucose 70 - 99 mg/dL 156(H) 162(H) 98 89 117  Cholesterol - (None) (None) (None) (None) (None)  HDL cholesterol - (None) (None) (None) (None) (None)  Triglycerides - (None) (None) (None) (None) (None)  LDL Direct - (None)  (None) (None) (None) (None)  LDL Calc - (None) (None) (None) (None) (None)  Total protein 6.0 - 8.3 g/dL (None) 6.5 (None) (None) (None)  Albumin 3.5 - 5.2 g/dL (None) 3.5 (None) (None) (None)   Lab Results  Component Value Date   BUN 18 01/02/2013   Lab Results  Component Value Date   HGBA1C 7.0* 10/13/2013   Lab Results  Component Value Date   TSH 0.332* 05/19/2012   Assessment/Plan  1. Dementia, without behavioral disturbance Continue Aricept. Titrate up on Namenda starting with 5 mg and progressing to 10 mg twice daily.  2. Essential hypertension Controlled  3. Diabetic polyneuropathy associated with diabetes mellitus due to underlying condition ([facility]) Unchanged  4. Type 2 diabetes, controlled, with neuropathy ([facility]) Controlled -A1c, CMP  5. Abnormality of gait Continue use of walker  6. Depression Resolved  7. Abnormal TSH On 05/19/2012 TSH was 0.332. I cannot find lab to show that this has been followed up on. -TSH

## 2014-10-28 LAB — BASIC METABOLIC PANEL WITH GFR
BUN: 10 mg/dL (ref 4–21)
Creatinine: 0.7 mg/dL (ref 0.5–1.1)
Glucose: 97 mg/dL
Potassium: 4 mmol/L (ref 3.4–5.3)
Sodium: 138 mmol/L (ref 137–147)

## 2014-10-28 LAB — HEPATIC FUNCTION PANEL
ALT: 9 U/L (ref 7–35)
AST: 11 U/L — AB (ref 13–35)
Alkaline Phosphatase: 52 U/L (ref 25–125)
Bilirubin, Total: 0.5 mg/dL

## 2014-10-28 LAB — TSH: TSH: 0.82 u[IU]/mL (ref 0.41–5.90)

## 2014-10-28 LAB — HEMOGLOBIN A1C: Hgb A1c MFr Bld: 6.2 % — AB (ref 4.0–6.0)

## 2014-11-02 ENCOUNTER — Telehealth: Payer: Self-pay | Admitting: *Deleted

## 2014-11-02 NOTE — Telephone Encounter (Signed)
Patient daughter, Teresa Richards called and wants to speak with you regarding [facility] moving her mother from [place] to SNF. Stated she would like some medical advise and wants to speak with Dr. Nyoka Cowden. Please call # [phone] (home) or call [phone] (cell)

## 2014-11-09 ENCOUNTER — Non-Acute Institutional Stay: Payer: Medicare Other | Admitting: Internal Medicine

## 2014-11-09 ENCOUNTER — Encounter: Payer: Self-pay | Admitting: Internal Medicine

## 2014-11-09 VITALS — BP 120/80 | HR 65 | Temp 97.4°F | Resp 20 | Wt 129.0 lb

## 2014-11-09 DIAGNOSIS — R7989 Other specified abnormal findings of blood chemistry: Secondary | ICD-10-CM | POA: Diagnosis not present

## 2014-11-09 DIAGNOSIS — E1140 Type 2 diabetes mellitus with diabetic neuropathy, unspecified: Secondary | ICD-10-CM

## 2014-11-09 DIAGNOSIS — R269 Unspecified abnormalities of gait and mobility: Secondary | ICD-10-CM

## 2014-11-09 DIAGNOSIS — R32 Unspecified urinary incontinence: Secondary | ICD-10-CM | POA: Diagnosis not present

## 2014-11-09 DIAGNOSIS — I10 Essential (primary) hypertension: Secondary | ICD-10-CM | POA: Diagnosis not present

## 2014-11-09 DIAGNOSIS — F0390 Unspecified dementia without behavioral disturbance: Secondary | ICD-10-CM | POA: Diagnosis not present

## 2014-11-09 NOTE — Progress Notes (Signed)
Patient ID: Teresa Richards, female   DOB: 04-20-1918, 81 y.o.   MRN: [id number]    [facility]     Place of Service: Clinic (12)     Allergies  Allergen Reactions  . Codeine Other (See Comments)    Per [facility]    Chief Complaint  Patient presents with  . Medical Management of Chronic Issues    decreased in ADL( patient has to be dressed and diapered everyday )    HPI:  Patient is requiring increasing help while on assisted living. She requires extensive help with bathing, some help with dressing, medication management, and bathroom use with supervision. She continues to have episodes of incontinence and her adult incontinence diapers.  Dementia, without behavioral disturbance - patient is tolerating the addition of Namenda  Type 2 diabetes, controlled, with neuropathy ([place]) - stable  Abnormality of gait - using four-wheel walker  Essential hypertension - amlodipine was stopped last visit. Blood pressure remains under control  Urinary incontinence, unspecified incontinence type - wearing adult incontinence diapers.  Abnormal TSH - normal on last check up   Medications: [name] Medications  New Prescriptions   No medications on file  Previous Medications   ACETAMINOPHEN (TYLENOL) 325 MG TABLET    Take 650 mg by mouth every 4 (four) hours as needed for moderate pain.   ASPIRIN 81 MG TABLET    Take 81 mg by mouth daily.   DONEPEZIL (ARICEPT) 10 MG TABLET    Take 10 mg by mouth daily.    LISINOPRIL (PRINIVIL,ZESTRIL) 40 MG TABLET    Take 40 mg by mouth daily. For HTN   METOPROLOL SUCCINATE (TOPROL-XL) 100 MG 24 HR TABLET    Take 100 mg by mouth daily. Take with or immediately following a meal.   MULTIPLE VITAMINS-MINERALS (CERTAVITE/ANTIOXIDANTS PO)    Take 1 tablet by mouth daily.  Modified Medications   No medications on file  Discontinued Medications   No medications on file     Review of Systems  Constitutional: Positive for fatigue.  HENT: Negative.     Eyes: Positive for visual disturbance (corrective lenses).  Respiratory: Negative.   Gastrointestinal: Negative.   Endocrine: Positive for polyuria.       Diabetic with neuropathy  Genitourinary: Positive for frequency. Negative for dysuria and flank pain.       Episodes of incontinence  Musculoskeletal: Positive for gait problem.  Skin: Negative.   Allergic/Immunologic: Negative.   Neurological: Negative for tremors, seizures, syncope, facial asymmetry, speech difficulty, weakness, light-headedness, numbness and headaches.       Dementia  Psychiatric/Behavioral: Positive for confusion and sleep disturbance.    Filed Vitals:   11/09/14 0908  BP: 120/80  Pulse: 65  Temp: 97.4 F (36.3 C)  TempSrc: Oral  Resp: 20  Weight: 129 lb (58.514 kg)  SpO2: 97%   Body mass index is 26.04 kg/(m^2).  Physical Exam  Constitutional: She is oriented to person, place, and time. She appears well-developed and well-nourished.  HENT:  Head: Normocephalic and atraumatic.  Right Ear: External ear normal.  Left Ear: External ear normal.  Eyes: Conjunctivae and EOM are normal. Pupils are equal, round, and reactive to light.  Neck: Neck supple. No JVD present. No tracheal deviation present. No thyromegaly present.  Cardiovascular: Normal rate.   Murmur heard.  Systolic murmur is present with a grade of 2/6  Pulmonary/Chest: Effort normal and breath sounds normal. No respiratory distress. She has no rales.  Abdominal: Soft. Bowel sounds  are normal. She exhibits no mass. There is no tenderness.  Musculoskeletal: She exhibits no edema or tenderness.  Unstable gait. Using rolling walker. Mild scoliosis of thoracic and lumbar spine.  Lymphadenopathy:    She has no cervical adenopathy.  Neurological: She is alert and oriented to person, place, and time. She has normal reflexes. No cranial nerve deficit. She exhibits normal muscle tone. Coordination normal.  Demented  Skin: Skin is warm and dry. No  rash noted. No erythema.  Psychiatric: She has a normal mood and affect. Her behavior is normal. Judgment and thought content normal. Her speech is not delayed and not slurred. She is not agitated, not aggressive, not hyperactive, not slowed, not withdrawn, not actively hallucinating and not combative. Thought content is not paranoid and not delusional. Cognition and memory are impaired. She exhibits abnormal recent memory.     Labs reviewed: Lab Summary Latest Ref Rng 10/28/2014 01/02/2013 01/02/2013 09/11/2012  Hemoglobin 12.0 - 15.0 g/dL (None) 12.6 12.3 (None)  Hematocrit 36.0 - 46.0 % (None) 37.0 35.9(L) (None)  White count 4.0 - 10.5 K/uL (None) (None) 3.2(L) (None)  Platelet count 150 - 400 K/uL (None) (None) 94(L) (None)  Sodium 137 - 147 mmol/L 138 137 135 139  Potassium 3.4 - 5.3 mmol/L 4.0 3.6 3.2(L) 3.6  Calcium 8.4 - 10.5 mg/dL (None) (None) 9.4 (None)  Phosphorus - (None) (None) (None) (None)  Creatinine 0.5 - 1.1 mg/dL 0.7 0.90 0.83 0.7  AST 13 - 35 U/L 11(A) (None) 18 (None)  Alk Phos 25 - 125 U/L 52 (None) 65 (None)  Bilirubin 0.3 - 1.2 mg/dL (None) (None) 0.4 (None)  Glucose - 97 156(H) 162(H) 98  Cholesterol - (None) (None) (None) (None)  HDL cholesterol - (None) (None) (None) (None)  Triglycerides - (None) (None) (None) (None)  LDL Direct - (None) (None) (None) (None)  LDL Calc - (None) (None) (None) (None)  Total protein 6.0 - 8.3 g/dL (None) (None) 6.5 (None)  Albumin 3.5 - 5.2 g/dL (None) (None) 3.5 (None)   Lab Results  Component Value Date   TSH 0.82 10/28/2014   Lab Results  Component Value Date   BUN 10 10/28/2014   Lab Results  Component Value Date   HGBA1C 6.2* 10/28/2014       Assessment/Plan  1. Dementia, without behavioral disturbance Continue on donepezil and continue to titrate up memantine  2. Type 2 diabetes, controlled, with neuropathy ([place]) Excellent control  3. Abnormality of gait Continue walk  4. Essential  hypertension Controlled off medication  5. Urinary incontinence, unspecified incontinence type Continue adult incontinence diapers  6. Abnormal TSH Normal when last checked

## 2014-11-16 ENCOUNTER — Non-Acute Institutional Stay (SKILLED_NURSING_FACILITY): Payer: Medicare Other | Admitting: Nurse Practitioner

## 2014-11-16 ENCOUNTER — Encounter: Payer: Self-pay | Admitting: Nurse Practitioner

## 2014-11-16 DIAGNOSIS — E1140 Type 2 diabetes mellitus with diabetic neuropathy, unspecified: Secondary | ICD-10-CM | POA: Diagnosis not present

## 2014-11-16 DIAGNOSIS — I10 Essential (primary) hypertension: Secondary | ICD-10-CM | POA: Diagnosis not present

## 2014-11-16 DIAGNOSIS — K5900 Constipation, unspecified: Secondary | ICD-10-CM | POA: Diagnosis not present

## 2014-11-16 DIAGNOSIS — F0390 Unspecified dementia without behavioral disturbance: Secondary | ICD-10-CM

## 2014-11-16 NOTE — Progress Notes (Signed)
Patient ID: Teresa Richards, female   DOB: [birth year], 81 y.o.   MRN: [id number]  Location:  SNF [facility] Provider:  Marlana Latus NP  Code Status:  DNR Goals of care: Advanced Directive information    Chief Complaint  Patient presents with  . Medical Management of Chronic Issues     HPI: Patient is a 81 y.o. female seen in the SNF at [facility] today for evaluation of chronic medical conditions. Admitted to SNF for higher level of care due to her general decline physically and cognitively.   Review of Systems:  Review of Systems  HENT: Negative.   Respiratory: Negative.   Gastrointestinal: Negative.   Genitourinary: Positive for frequency. Negative for dysuria and flank pain.       Episodes of incontinence  Skin: Negative.   Neurological: Negative for tremors, seizures, weakness and headaches.       Dementia    Past Medical History  Diagnosis Date  . Diabetes mellitus without complication ([place])   . Hypertension   . Melanoma ([place]) 1960    left shoulder skin  . Dementia   . Peripheral neuropathy ([place])   . Glaucoma   . Atrial fibrillation ([place])   . Bradycardia   . Umbilical hernia   . Lower back pain   . Muscle pain     Patient Active Problem List   Diagnosis Date Noted  . Constipation 11/16/2014  . Urinary incontinence 11/09/2014  . Abnormal TSH 10/26/2014  . Diabetic neuropathy ([place]) 10/27/2013  . Idiopathic scoliosis 10/27/2013  . Type 2 diabetes, controlled, with neuropathy ([place]) 04/21/2013  . Vertebral fracture 11/11/2012  . DNR (do not resuscitate) 07/04/2012  . Stroke ([place]) 05/19/2012  . HTN (hypertension) 05/19/2012  . 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 Anxiety state 05/13/2012  . Lumbago 04/25/2012  . Abnormality of gait 04/25/2012  . Fall at nursing home 03/08/2012  . Subdural hematoma ([place]) 03/08/2012  . Dementia 03/08/2012    Allergies  Allergen Reactions  . Codeine Other (See Comments)    Per MAR    Medications: [name] Medications    New Prescriptions   No medications on file  Previous Medications   ACETAMINOPHEN (TYLENOL) 325 MG TABLET    Take 650 mg by mouth every 4 (four) hours as needed for moderate pain.   ASPIRIN 81 MG TABLET    Take 81 mg by mouth daily.   DONEPEZIL (ARICEPT) 10 MG TABLET    Take 10 mg by mouth daily.    LISINOPRIL (PRINIVIL,ZESTRIL) 40 MG TABLET    Take 40 mg by mouth daily. For HTN   METOPROLOL SUCCINATE (TOPROL-XL) 100 MG 24 HR TABLET    Take 100 mg by mouth daily. Take with or immediately following a meal.   MULTIPLE VITAMINS-MINERALS (CERTAVITE/ANTIOXIDANTS PO)    Take 1 tablet by mouth daily.  Modified Medications   No medications on file  Discontinued Medications   No medications on file    Physical Exam: Filed Vitals:   11/16/14 1457  BP: 162/74  Pulse: 60  Temp: 97.4 F (36.3 C)  TempSrc: Tympanic  Resp: 18   There is no weight on file to calculate BMI.  Physical Exam  Constitutional: She is oriented to person, place, and time. She appears well-developed and well-nourished.  HENT:  Head: Normocephalic and atraumatic.  Right Ear: External ear normal.  Left Ear: External ear normal.  Eyes: Conjunctivae and EOM are normal. Pupils are equal, round, and reactive to light.  Neck:  Neck supple. No JVD present. No tracheal deviation present. No thyromegaly present.  Cardiovascular: Normal rate.   Murmur heard.  Systolic murmur is present with a grade of 2/6  Pulmonary/Chest: Effort normal and breath sounds normal. No respiratory distress. She has no rales.  Abdominal: Soft. Bowel sounds are normal. She exhibits no mass. There is no tenderness.  Musculoskeletal: She exhibits no edema or tenderness.  Unstable gait. Using rolling walker. Mild scoliosis of thoracic and lumbar spine.  Lymphadenopathy:    She has no cervical adenopathy.  Neurological: She is alert and oriented to person, place, and time. She has normal reflexes. No cranial nerve deficit. She exhibits normal muscle  tone. Coordination normal.  Demented  Skin: Skin is warm and dry. No rash noted. No erythema.  Psychiatric: She has a normal mood and affect. Her behavior is normal. Judgment and thought content normal. Her speech is not delayed and not slurred. She is not agitated, not aggressive, not hyperactive, not slowed, not withdrawn, not actively hallucinating and not combative. Thought content is not paranoid and not delusional. Cognition and memory are impaired. She exhibits abnormal recent memory.    Labs reviewed: Basic Metabolic Panel:  Recent Labs  10/28/14  NA 138  K 4.0  BUN 10  CREATININE 0.7    Liver Function Tests:  Recent Labs  10/28/14  AST 11*  ALT 9  ALKPHOS 52    CBC: No results for input(s): WBC, NEUTROABS, HGB, HCT, MCV, PLT in the last 8760 hours.  Lab Results  Component Value Date   TSH 0.82 10/28/2014   Lab Results  Component Value Date   HGBA1C 6.2* 10/28/2014   Lab Results  Component Value Date   CHOL 191 05/20/2012   HDL 38* 05/20/2012   LDLCALC 118* 05/20/2012   TRIG 174* 05/20/2012   CHOLHDL 5.0 05/20/2012    Significant Diagnostic Results since last visit: none  Patient Care Team: Estill Dooms, MD as PCP - General (Internal Medicine)  Assessment/Plan Problem List Items Addressed This Visit    Constipation    Stable, contineu Fiber Laxative daily      Dementia (Chronic)    Gradual decline, continue Namenda and Aricept, SNF for care needs.       HTN (hypertension) (Chronic)    controlled, continue Lisinopril 40mg  and Metoprolol succinate 100mg        Type 2 diabetes, controlled, with neuropathy ([facility]) - Primary (Chronic)    Last Hgb A1c 6.2 10/28/14, diet controlled.           Family/ staff Communication: SNF for care needs due to lack of self sufficiency in AL   Labs/tests ordered: none  [facility] Mast NP Geriatrics [facility] [address], [place] [address] On Call:  [phone]  & follow prompts after 5pm & weekends Office Phone:  [phone] Office Fax:  [phone]

## 2014-11-16 NOTE — Assessment & Plan Note (Signed)
Last Hgb A1c 6.2 10/28/14, diet controlled.

## 2014-11-16 NOTE — Assessment & Plan Note (Signed)
Gradual decline, continue Namenda and Aricept, SNF for care needs.

## 2014-11-16 NOTE — Assessment & Plan Note (Signed)
controlled, continue Lisinopril 40mg  and Metoprolol succinate 100mg 

## 2014-11-16 NOTE — Assessment & Plan Note (Signed)
Stable, contineu Fiber Laxative daily

## 2014-11-25 ENCOUNTER — Non-Acute Institutional Stay (SKILLED_NURSING_FACILITY): Payer: Medicare Other | Admitting: Internal Medicine

## 2014-11-25 DIAGNOSIS — E1140 Type 2 diabetes mellitus with diabetic neuropathy, unspecified: Secondary | ICD-10-CM

## 2014-11-25 DIAGNOSIS — F0390 Unspecified dementia without behavioral disturbance: Secondary | ICD-10-CM | POA: Diagnosis not present

## 2014-11-25 DIAGNOSIS — F411 Generalized anxiety disorder: Secondary | ICD-10-CM | POA: Diagnosis not present

## 2014-11-25 DIAGNOSIS — R269 Unspecified abnormalities of gait and mobility: Secondary | ICD-10-CM | POA: Diagnosis not present

## 2014-11-25 DIAGNOSIS — I10 Essential (primary) hypertension: Secondary | ICD-10-CM | POA: Diagnosis not present

## 2014-11-29 ENCOUNTER — Encounter: Payer: Self-pay | Admitting: Internal Medicine

## 2014-11-29 NOTE — Progress Notes (Signed)
Patient ID: Teresa Richards, female   DOB: [birth year], 81 y.o.   MRN: [id number]    HISTORY AND PHYSICAL  Location:  [place] Room Number: N 27 Place of Service: SNF (31)   Extended Emergency Contact Information Primary Emergency Contact: Hanzaker,[name] Address: [address]          [place], [place] [address] Johnnette Litter of [place] Phone: [phone] Mobile Phone: [phone] Relation: Daughter Secondary Emergency Contact: [place] [address] [place] of [place] Phone: [phone] Relation: Grandson  Advanced Directive information Does patient have an advance directive?: Yes, Type of Advance Directive: Healthcare Power of [place];Living will;Out of facility DNR (pink MOST or yellow form), Pre-existing out of facility DNR order (yellow form or pink MOST form): Yellow form placed in chart (order not valid for inpatient use), Does patient want to make changes to advanced directive?: No - Patient declined  Chief Complaint  Patient presents with  . New Admit To SNF    By transfer from the assisted living unit at [facility]    HPI:  This patient was transferred from the assisted living area of [facility] [place] on 11/16/2014 to the skilled nursing facility area due to increasing dependencies and a high fall risk. Patient has dementia and has had progression in her cognitive dysfunction and dependencies on staff.  Her diabetes mellitus is under good control. Although her blood pressure is high today, he generally has been under good control.  She has chronic atrial fibrillation but is not anticoagulated due to a unstable gait and a high fall risk. It should be noted that she fell about 3 years ago and sustained a subdural hematoma. She appears to have recovered from that without serious residual deficits attributable to the subdural hematoma.  She generally is pleasant and we are able to direct her, but she needs  much guidance in regards to daily activities.  Past Medical History  Diagnosis Date  . Diabetes mellitus without complication ([place])   . Hypertension   . Melanoma ([place]) 1960    left shoulder skin  . Dementia   . Peripheral neuropathy ([place])   . Glaucoma   . Atrial fibrillation ([place])   . Bradycardia   . Umbilical hernia   . Lower back pain   . Muscle pain     Past Surgical History  Procedure Laterality Date  . Skin cancer excision Left 1960's    melanoma shoulder    Patient Care Team: Estill Dooms, MD as PCP - General (Internal Medicine)  Social History   Social History  . Marital Status: Married    Spouse Name: N/A  . Number of Children: 1  . Years of Education: 12   Occupational History  . retired Network engineer    Social History Main Topics  . Smoking status: Never Smoker   . Smokeless tobacco: Never Used  . Alcohol Use: No     Comment: Patient drinks coffee occasional.  . Drug Use: No  . Sexual Activity: No   Other Topics Concern  . Not on file   Social History Narrative   Lives at [facility]    Widowed    Has MOST form signed   DNR, POA, Living Will   Walks with walker   Exercise: once a day does legs and arms      reports that she has never smoked. She has never used smokeless  tobacco. She reports that she does not drink alcohol or use illicit drugs.  Family History  Problem Relation Age of Onset  . Leukemia Father   . Heart disease Brother    Family Status  Relation Status Death Age  . Father Deceased   . Mother Deceased   . Daughter Alive   . Sister Alive   . Brother Deceased   . Brother Alive   . Brother Alive   . Brother Deceased     Cause of death unknown  . Sister Alive     Immunization History  Administered Date(s) Administered  . Influenza-Unspecified 11/10/2012, 10/27/2013  . PPD Test 11/17/2011  . Pneumococcal Polysaccharide-23 11/23/1999  . Td 10/22/2000    Allergies  Allergen Reactions  . Codeine Other (See Comments)     Per MAR    Medications: [name] Medications  New Prescriptions   No medications on file  Previous Medications   ACETAMINOPHEN (TYLENOL) 325 MG TABLET    Take 650 mg by mouth every 4 (four) hours as needed for moderate pain.   ASPIRIN 81 MG TABLET    Take 81 mg by mouth daily.   DONEPEZIL (ARICEPT) 10 MG TABLET    Take 10 mg by mouth daily.    LISINOPRIL (PRINIVIL,ZESTRIL) 40 MG TABLET    Take 40 mg by mouth daily. For HTN   METOPROLOL SUCCINATE (TOPROL-XL) 100 MG 24 HR TABLET    Take 100 mg by mouth daily. Take with or immediately following a meal.   MULTIPLE VITAMINS-MINERALS (CERTAVITE/ANTIOXIDANTS PO)    Take 1 tablet by mouth daily.  Modified Medications   No medications on file  Discontinued Medications   No medications on file    Review of Systems  Constitutional: Positive for fatigue.  HENT: Negative.   Eyes: Positive for visual disturbance (corrective lenses).  Respiratory: Negative.   Gastrointestinal: Negative.   Endocrine: Positive for polyuria.       Diabetic with neuropathy  Genitourinary: Positive for frequency. Negative for dysuria and flank pain.       Episodes of incontinence  Musculoskeletal: Positive for gait problem.  Skin: Negative.   Allergic/Immunologic: Negative.   Neurological: Negative for tremors, seizures, syncope, facial asymmetry, speech difficulty, weakness, light-headedness, numbness and headaches.       Dementia  Psychiatric/Behavioral: Positive for confusion and sleep disturbance.    Filed Vitals:   11/29/14 1618  BP: 161/78  Pulse: 60  Temp: 97.8 F (36.6 C)  Resp: 16  Height: $Remove'4\' 11"'AaGJTbc$  (1.499 m)  Weight: 129 lb (58.514 kg)  SpO2: 95%   Body mass index is 26.04 kg/(m^2).  Physical Exam  Constitutional: She is oriented to person, place, and time. She appears well-developed and well-nourished.  HENT:  Head: Normocephalic and atraumatic.  Right Ear: External ear normal.  Left Ear: External ear normal.  Eyes: Conjunctivae and EOM  are normal. Pupils are equal, round, and reactive to light.  Neck: Neck supple. No JVD present. No tracheal deviation present. No thyromegaly present.  Cardiovascular: Normal rate.   Murmur heard.  Systolic murmur is present with a grade of 2/6  Pulmonary/Chest: Effort normal and breath sounds normal. No respiratory distress. She has no rales.  Abdominal: Soft. Bowel sounds are normal. She exhibits no mass. There is no tenderness.  Musculoskeletal: She exhibits no edema or tenderness.  Unstable gait. Using rolling walker. Mild scoliosis of thoracic and lumbar spine.  Lymphadenopathy:    She has no cervical adenopathy.  Neurological: She is alert  and oriented to person, place, and time. She has normal reflexes. No cranial nerve deficit. She exhibits normal muscle tone. Coordination normal.  Demented  Skin: Skin is warm and dry. No rash noted. No erythema.  Psychiatric: She has a normal mood and affect. Her behavior is normal. Judgment and thought content normal. Her speech is not delayed and not slurred. She is not agitated, not aggressive, not hyperactive, not slowed, not withdrawn, not actively hallucinating and not combative. Thought content is not paranoid and not delusional. Cognition and memory are impaired. She exhibits abnormal recent memory.    Labs reviewed: Lab Summary Latest Ref Rng 10/28/2014 01/02/2013 01/02/2013 09/11/2012  Hemoglobin 12.0 - 15.0 g/dL (None) 12.6 12.3 (None)  Hematocrit 36.0 - 46.0 % (None) 37.0 35.9(L) (None)  White count 4.0 - 10.5 K/uL (None) (None) 3.2(L) (None)  Platelet count 150 - 400 K/uL (None) (None) 94(L) (None)  Sodium 137 - 147 mmol/L 138 137 135 139  Potassium 3.4 - 5.3 mmol/L 4.0 3.6 3.2(L) 3.6  Calcium 8.4 - 10.5 mg/dL (None) (None) 9.4 (None)  Phosphorus - (None) (None) (None) (None)  Creatinine 0.5 - 1.1 mg/dL 0.7 0.90 0.83 0.7  AST 13 - 35 U/L 11(A) (None) 18 (None)  Alk Phos 25 - 125 U/L 52 (None) 65 (None)  Bilirubin 0.3 - 1.2 mg/dL  (None) (None) 0.4 (None)  Glucose - 97 156(H) 162(H) 98  Cholesterol - (None) (None) (None) (None)  HDL cholesterol - (None) (None) (None) (None)  Triglycerides - (None) (None) (None) (None)  LDL Direct - (None) (None) (None) (None)  LDL Calc - (None) (None) (None) (None)  Total protein 6.0 - 8.3 g/dL (None) (None) 6.5 (None)  Albumin 3.5 - 5.2 g/dL (None) (None) 3.5 (None)   Lab Results  Component Value Date   BUN 10 10/28/2014   Lab Results  Component Value Date   HGBA1C 6.2* 10/28/2014   Lab Results  Component Value Date   TSH 0.82 10/28/2014     Assessment/Plan  1. Dementia, without behavioral disturbance Progressive decline. Continue donepezil 10 mg daily. Consider addition of memantine. I anticipate that her move to SNF will be permanent long-term care due to her declines from dementia.  2. Abnormality of gait No recent falls, but gait remains very unstable.  3. Essential hypertension Generally controlled  4. Type 2 diabetes, controlled, with neuropathy ([place]) Generally controlled  5. Anxiety state Controlled

## 2014-12-10 ENCOUNTER — Encounter: Payer: Self-pay | Admitting: Nurse Practitioner

## 2014-12-10 ENCOUNTER — Non-Acute Institutional Stay (SKILLED_NURSING_FACILITY): Payer: Medicare Other | Admitting: Nurse Practitioner

## 2014-12-10 DIAGNOSIS — I10 Essential (primary) hypertension: Secondary | ICD-10-CM | POA: Diagnosis not present

## 2014-12-10 DIAGNOSIS — F0390 Unspecified dementia without behavioral disturbance: Secondary | ICD-10-CM

## 2014-12-10 DIAGNOSIS — E1140 Type 2 diabetes mellitus with diabetic neuropathy, unspecified: Secondary | ICD-10-CM

## 2014-12-10 DIAGNOSIS — K5900 Constipation, unspecified: Secondary | ICD-10-CM

## 2014-12-10 DIAGNOSIS — N3946 Mixed incontinence: Secondary | ICD-10-CM | POA: Diagnosis not present

## 2014-12-10 NOTE — Assessment & Plan Note (Signed)
Managed with adult depends. Off Myrbetriq.

## 2014-12-10 NOTE — Assessment & Plan Note (Signed)
controlled, continue Lisinopril 40mg , Metoprolol succinate 100mg 

## 2014-12-10 NOTE — Assessment & Plan Note (Signed)
Stable, contineu Fiber Laxative daily 

## 2014-12-10 NOTE — Assessment & Plan Note (Signed)
Last Hgb A1c 6.2 10/28/14, diet controlled.  

## 2014-12-10 NOTE — Progress Notes (Signed)
Patient ID: Teresa Richards, female   DOB: 1918-12-23, 81 y.o.   MRN: [id number]  Location:  SNF [facility] Provider:  Marlana Latus NP  Code Status:  DNR Goals of care: Advanced Directive information    Chief Complaint  Patient presents with  . Medical Management of Chronic Issues     HPI: Patient is a 81 y.o. female seen in the SNF at [facility] today for evaluation of chronic medical conditions. Hx of dementia, likely vascular dementia, taking Aricept presently. Controlled blood pressure while on Lisinopril and Metoprolol. Diet controlled blood sugar, last Hgb 6s. Denied constipation. Managing her urinary incontinence with adult depends, off Myrbetriq.   Review of Systems  HENT: Negative.   Respiratory: Negative.   Gastrointestinal: Negative.   Genitourinary: Positive for frequency. Negative for dysuria and flank pain.       Episodes of incontinence  Skin: Negative.   Neurological: Negative for tremors, seizures, weakness and headaches.       Dementia    Past Medical History  Diagnosis Date  . Diabetes mellitus without complication ([place])   . Hypertension   . Melanoma ([place]) 1960    left shoulder skin  . Dementia   . Peripheral neuropathy ([place])   . Glaucoma   . Atrial fibrillation ([place])   . Bradycardia   . Umbilical hernia   . Lower back pain   . Muscle pain     Patient Active Problem List   Diagnosis Date Noted  . Constipation 11/16/2014  . Urinary incontinence 11/09/2014  . Abnormal TSH 10/26/2014  . Diabetic neuropathy ([place]) 10/27/2013  . Idiopathic scoliosis 10/27/2013  . Type 2 diabetes, controlled, with neuropathy ([place]) 04/21/2013  . Vertebral fracture 11/11/2012  . DNR (do not resuscitate) 07/04/2012  . Stroke ([place]) 05/19/2012  . HTN (hypertension) 05/19/2012  . 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 Anxiety state 05/13/2012  . Lumbago 04/25/2012  . Abnormality of gait 04/25/2012  . Fall at nursing home 03/08/2012  . Subdural hematoma ([place]) 03/08/2012  .  Dementia 03/08/2012    Allergies  Allergen Reactions  . Codeine Other (See Comments)    Per MAR    Medications: [name] Medications  New Prescriptions   No medications on file  Previous Medications   ACETAMINOPHEN (TYLENOL) 325 MG TABLET    Take 650 mg by mouth every 4 (four) hours as needed for moderate pain.   ASPIRIN 81 MG TABLET    Take 81 mg by mouth daily.   DONEPEZIL (ARICEPT) 10 MG TABLET    Take 10 mg by mouth daily.    LISINOPRIL (PRINIVIL,ZESTRIL) 40 MG TABLET    Take 40 mg by mouth daily. For HTN   METOPROLOL SUCCINATE (TOPROL-XL) 100 MG 24 HR TABLET    Take 100 mg by mouth daily. Take with or immediately following a meal.   MULTIPLE VITAMINS-MINERALS (CERTAVITE/ANTIOXIDANTS PO)    Take 1 tablet by mouth daily.  Modified Medications   No medications on file  Discontinued Medications   No medications on file    Physical Exam: Filed Vitals:   12/10/14 1435  BP: 122/76  Pulse: 59  Temp: 97.9 F (36.6 C)  TempSrc: Tympanic  Resp: 20   There is no weight on file to calculate BMI.  Physical Exam  Constitutional: She is oriented to person, place, and time. She appears well-developed and well-nourished.  HENT:  Head: Normocephalic and atraumatic.  Right Ear: External ear normal.  Left Ear: External ear normal.  Eyes: Conjunctivae  and EOM are normal. Pupils are equal, round, and reactive to light.  Neck: Neck supple. No JVD present. No tracheal deviation present. No thyromegaly present.  Cardiovascular: Normal rate.   Murmur heard.  Systolic murmur is present with a grade of 2/6  Pulmonary/Chest: Effort normal and breath sounds normal. No respiratory distress. She has no rales.  Abdominal: Soft. Bowel sounds are normal. She exhibits no mass. There is no tenderness.  Musculoskeletal: She exhibits no edema or tenderness.  Unstable gait. Using rolling walker. Mild scoliosis of thoracic and lumbar spine.  Lymphadenopathy:    She has no cervical adenopathy.    Neurological: She is alert and oriented to person, place, and time. She has normal reflexes. No cranial nerve deficit. She exhibits normal muscle tone. Coordination normal.  Demented  Skin: Skin is warm and dry. No rash noted. No erythema.  Psychiatric: She has a normal mood and affect. Her behavior is normal. Judgment and thought content normal. Her speech is not delayed and not slurred. She is not agitated, not aggressive, not hyperactive, not slowed, not withdrawn, not actively hallucinating and not combative. Thought content is not paranoid and not delusional. Cognition and memory are impaired. She exhibits abnormal recent memory.    Labs reviewed: Basic Metabolic Panel:  Recent Labs  10/28/14  NA 138  K 4.0  BUN 10  CREATININE 0.7    Liver Function Tests:  Recent Labs  10/28/14  AST 11*  ALT 9  ALKPHOS 52    CBC: No results for input(s): WBC, NEUTROABS, HGB, HCT, MCV, PLT in the last 8760 hours.  Lab Results  Component Value Date   TSH 0.82 10/28/2014   Lab Results  Component Value Date   HGBA1C 6.2* 10/28/2014   Lab Results  Component Value Date   CHOL 191 05/20/2012   HDL 38* 05/20/2012   LDLCALC 118* 05/20/2012   TRIG 174* 05/20/2012   CHOLHDL 5.0 05/20/2012    Significant Diagnostic Results since last visit: none  Patient Care Team: Estill Dooms, MD as PCP - General (Internal Medicine)  Assessment/Plan Problem List Items Addressed This Visit    Dementia - Primary (Chronic)    Gradual decline, continue Namenda and Aricept, SNF for care needs.       HTN (hypertension) (Chronic)    controlled, continue Lisinopril 40mg , Metoprolol succinate 100mg        Type 2 diabetes, controlled, with neuropathy (HCC) (Chronic)    Last Hgb A1c 6.2 10/28/14, diet controlled.         Urinary incontinence    Managed with adult depends. Off Myrbetriq.       Constipation    Stable, contineu Fiber Laxative daily          Family/ staff Communication:  continue SNF for care needs.   Labs/tests ordered: none  ManXie Kennah Hehr NP [facility] [address], [place] [address] On Call:  [phone] & follow prompts after 5pm & weekends Office Phone:  [phone] Office Fax:  [phone]

## 2014-12-10 NOTE — Assessment & Plan Note (Signed)
Gradual decline, continue Namenda and Aricept, SNF for care needs.  

## 2015-01-04 ENCOUNTER — Non-Acute Institutional Stay (SKILLED_NURSING_FACILITY): Payer: Medicare Other | Admitting: Nurse Practitioner

## 2015-01-04 ENCOUNTER — Encounter: Payer: Self-pay | Admitting: Nurse Practitioner

## 2015-01-04 DIAGNOSIS — R32 Unspecified urinary incontinence: Secondary | ICD-10-CM | POA: Diagnosis not present

## 2015-01-04 DIAGNOSIS — E1140 Type 2 diabetes mellitus with diabetic neuropathy, unspecified: Secondary | ICD-10-CM | POA: Diagnosis not present

## 2015-01-04 DIAGNOSIS — F0390 Unspecified dementia without behavioral disturbance: Secondary | ICD-10-CM

## 2015-01-04 DIAGNOSIS — K5900 Constipation, unspecified: Secondary | ICD-10-CM

## 2015-01-04 DIAGNOSIS — I10 Essential (primary) hypertension: Secondary | ICD-10-CM | POA: Diagnosis not present

## 2015-01-04 NOTE — Assessment & Plan Note (Signed)
Gradual decline, continue Namenda and Aricept, SNF for care needs.  

## 2015-01-04 NOTE — Assessment & Plan Note (Signed)
Stable, contineu Fiber Laxative daily 

## 2015-01-04 NOTE — Assessment & Plan Note (Signed)
Last Hgb A1c 6.2 10/28/14, diet controlled.  

## 2015-01-04 NOTE — Progress Notes (Signed)
Patient ID: Teresa Richards, female   DOB: [birth year], 81 y.o.   MRN: [id number]  Location:  SNF [facility] Provider:  Marlana Latus NP  Code Status:  DNR Goals of care: Advanced Directive information    Chief Complaint  Patient presents with  . Medical Management of Chronic Issues     HPI: Patient is a 81 y.o. female seen in the SNF at [facility] today for evaluation of Hx of dementia, likely vascular dementia, taking Aricept presently. Controlled blood pressure while on Lisinopril and Metoprolol. Diet controlled blood sugar, last Hgb 6s. Denied constipation. Managing her urinary incontinence with adult depends, off Myrbetriq.   Review of Systems  HENT: Negative.   Respiratory: Negative.   Gastrointestinal: Negative.   Genitourinary: Positive for frequency. Negative for dysuria and flank pain.       Episodes of incontinence  Skin: Negative.   Neurological: Negative for tremors, seizures, weakness and headaches.       Dementia    Past Medical History  Diagnosis Date  . Diabetes mellitus without complication ([place])   . Hypertension   . Melanoma ([place]) 1960    left shoulder skin  . Dementia   . Peripheral neuropathy ([place])   . Glaucoma   . Atrial fibrillation ([place])   . Bradycardia   . Umbilical hernia   . Lower back pain   . Muscle pain     Patient Active Problem List   Diagnosis Date Noted  . Constipation 11/16/2014  . Urinary incontinence 11/09/2014  . Abnormal TSH 10/26/2014  . Diabetic neuropathy ([place]) 10/27/2013  . Idiopathic scoliosis 10/27/2013  . Type 2 diabetes, controlled, with neuropathy ([place]) 04/21/2013  . Vertebral fracture 11/11/2012  . DNR (do not resuscitate) 07/04/2012  . Stroke ([place]) 05/19/2012  . HTN (hypertension) 05/19/2012  . 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 Anxiety state 05/13/2012  . Lumbago 04/25/2012  . Abnormality of gait 04/25/2012  . Fall at nursing home 03/08/2012  . Subdural hematoma ([place]) 03/08/2012  . Dementia 03/08/2012     Allergies  Allergen Reactions  . Codeine Other (See Comments)    Per MAR    Medications: [name] Medications  New Prescriptions   No medications on file  Previous Medications   ACETAMINOPHEN (TYLENOL) 325 MG TABLET    Take 650 mg by mouth every 4 (four) hours as needed for moderate pain.   ASPIRIN 81 MG TABLET    Take 81 mg by mouth daily.   DONEPEZIL (ARICEPT) 10 MG TABLET    Take 10 mg by mouth daily.    LISINOPRIL (PRINIVIL,ZESTRIL) 40 MG TABLET    Take 40 mg by mouth daily. For HTN   METOPROLOL SUCCINATE (TOPROL-XL) 100 MG 24 HR TABLET    Take 100 mg by mouth daily. Take with or immediately following a meal.   MULTIPLE VITAMINS-MINERALS (CERTAVITE/ANTIOXIDANTS PO)    Take 1 tablet by mouth daily.  Modified Medications   No medications on file  Discontinued Medications   No medications on file    Physical Exam: Filed Vitals:   01/04/15 1411  BP: 142/70  Pulse: 64  Temp: 97.1 F (36.2 C)  TempSrc: Tympanic  Resp: 20   There is no weight on file to calculate BMI.  Physical Exam  Constitutional: She is oriented to person, place, and time. She appears well-developed and well-nourished.  HENT:  Head: Normocephalic and atraumatic.  Right Ear: External ear normal.  Left Ear: External ear normal.  Eyes: Conjunctivae and EOM are  normal. Pupils are equal, round, and reactive to light.  Neck: Neck supple. No JVD present. No tracheal deviation present. No thyromegaly present.  Cardiovascular: Normal rate.   Murmur heard.  Systolic murmur is present with a grade of 2/6  Pulmonary/Chest: Effort normal and breath sounds normal. No respiratory distress. She has no rales.  Abdominal: Soft. Bowel sounds are normal. She exhibits no mass. There is no tenderness.  Musculoskeletal: She exhibits no edema or tenderness.  Unstable gait. Using rolling walker. Mild scoliosis of thoracic and lumbar spine.  Lymphadenopathy:    She has no cervical adenopathy.  Neurological: She is  alert and oriented to person, place, and time. She has normal reflexes. No cranial nerve deficit. She exhibits normal muscle tone. Coordination normal.  Demented  Skin: Skin is warm and dry. No rash noted. No erythema.  Psychiatric: She has a normal mood and affect. Her behavior is normal. Judgment and thought content normal. Her speech is not delayed and not slurred. She is not agitated, not aggressive, not hyperactive, not slowed, not withdrawn, not actively hallucinating and not combative. Thought content is not paranoid and not delusional. Cognition and memory are impaired. She exhibits abnormal recent memory.    Labs reviewed: Basic Metabolic Panel:  Recent Labs  10/28/14  NA 138  K 4.0  BUN 10  CREATININE 0.7    Liver Function Tests:  Recent Labs  10/28/14  AST 11*  ALT 9  ALKPHOS 52    CBC: No results for input(s): WBC, NEUTROABS, HGB, HCT, MCV, PLT in the last 8760 hours.  Lab Results  Component Value Date   TSH 0.82 10/28/2014   Lab Results  Component Value Date   HGBA1C 6.2* 10/28/2014   Lab Results  Component Value Date   CHOL 191 05/20/2012   HDL 38* 05/20/2012   LDLCALC 118* 05/20/2012   TRIG 174* 05/20/2012   CHOLHDL 5.0 05/20/2012    Significant Diagnostic Results since last visit: none  Patient Care Team: Estill Dooms, MD as PCP - General (Internal Medicine)  Assessment/Plan Problem List Items Addressed This Visit    Constipation - Primary    Stable, contineu Fiber Laxative daily      Dementia (Chronic)    Gradual decline, continue Namenda and Aricept, SNF for care needs.       HTN (hypertension) (Chronic)    controlled, continue Lisinopril 40mg , Metoprolol succinate 100mg        Type 2 diabetes, controlled, with neuropathy (HCC) (Chronic)    Last Hgb A1c 6.2 10/28/14, diet controlled.       Urinary incontinence    Managed with adult depends. Off Myrbetriq.           Family/ staff Communication: continue SNF for care needs.    Labs/tests ordered: none  ManXie Brelynn Wheller NP [facility] [address], [place] [address] On Call:  [phone] & follow prompts after 5pm & weekends Office Phone:  [phone] Office Fax:  [phone]

## 2015-01-04 NOTE — Assessment & Plan Note (Signed)
controlled, continue Lisinopril 40mg , Metoprolol succinate 100mg 

## 2015-01-04 NOTE — Assessment & Plan Note (Signed)
Managed with adult depends. Off Myrbetriq.

## 2015-02-04 ENCOUNTER — Encounter: Payer: Self-pay | Admitting: Nurse Practitioner

## 2015-02-04 ENCOUNTER — Non-Acute Institutional Stay (SKILLED_NURSING_FACILITY): Payer: Medicare Other | Admitting: Nurse Practitioner

## 2015-02-04 DIAGNOSIS — I10 Essential (primary) hypertension: Secondary | ICD-10-CM

## 2015-02-04 DIAGNOSIS — K5900 Constipation, unspecified: Secondary | ICD-10-CM | POA: Diagnosis not present

## 2015-02-04 DIAGNOSIS — F0390 Unspecified dementia without behavioral disturbance: Secondary | ICD-10-CM | POA: Diagnosis not present

## 2015-02-04 DIAGNOSIS — E1140 Type 2 diabetes mellitus with diabetic neuropathy, unspecified: Secondary | ICD-10-CM

## 2015-02-04 DIAGNOSIS — R32 Unspecified urinary incontinence: Secondary | ICD-10-CM | POA: Diagnosis not present

## 2015-02-04 NOTE — Assessment & Plan Note (Signed)
Managed with adult depends. Off Myrbetriq.

## 2015-02-04 NOTE — Assessment & Plan Note (Signed)
Last Hgb A1c 6.2 10/28/14, diet controlled.  

## 2015-02-04 NOTE — Assessment & Plan Note (Signed)
controlled, continue Lisinopril 40mg , Metoprolol succinate 100mg 

## 2015-02-04 NOTE — Progress Notes (Signed)
Patient ID: Teresa Richards, female   DOB: [birth year], 82 y.o.   MRN: [id number]  Location:  SNF [facility] Provider:  Marlana Latus NP  Code Status:  DNR Goals of care: Advanced Directive information    Chief Complaint  Patient presents with  . Medical Management of Chronic Issues     HPI: Patient is a 82 y.o. female seen in the SNF at [facility] today for evaluation of Hx of dementia, likely vascular dementia, taking Aricept presently. Controlled blood pressure while on Lisinopril and Metoprolol. Diet controlled blood sugar, last Hgb 6s. Denied constipation. Managing her urinary incontinence with adult depends, off Myrbetriq.   Review of Systems  HENT: Negative.   Respiratory: Negative.   Gastrointestinal: Negative.   Genitourinary: Positive for frequency. Negative for dysuria and flank pain.       Episodes of incontinence  Skin: Negative.   Neurological: Negative for tremors, seizures, weakness and headaches.       Dementia    Past Medical History  Diagnosis Date  . Diabetes mellitus without complication ([place])   . Hypertension   . Melanoma ([place]) 1960    left shoulder skin  . Dementia   . Peripheral neuropathy ([place])   . Glaucoma   . Atrial fibrillation ([place])   . Bradycardia   . Umbilical hernia   . Lower back pain   . Muscle pain     Patient Active Problem List   Diagnosis Date Noted  . Constipation 11/16/2014  . Urinary incontinence 11/09/2014  . Abnormal TSH 10/26/2014  . Diabetic neuropathy ([place]) 10/27/2013  . Idiopathic scoliosis 10/27/2013  . Type 2 diabetes, controlled, with neuropathy ([place]) 04/21/2013  . Vertebral fracture 11/11/2012  . DNR (do not resuscitate) 07/04/2012  . Stroke ([place]) 05/19/2012  . HTN (hypertension) 05/19/2012  . 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 Anxiety state 05/13/2012  . Lumbago 04/25/2012  . Abnormality of gait 04/25/2012  . Fall at nursing home 03/08/2012  . Subdural hematoma ([place]) 03/08/2012  . Dementia 03/08/2012     Allergies  Allergen Reactions  . Codeine Other (See Comments)    Per MAR    Medications: [name] Medications  New Prescriptions   No medications on file  Previous Medications   ACETAMINOPHEN (TYLENOL) 325 MG TABLET    Take 650 mg by mouth every 4 (four) hours as needed for moderate pain.   ASPIRIN 81 MG TABLET    Take 81 mg by mouth daily.   DONEPEZIL (ARICEPT) 10 MG TABLET    Take 10 mg by mouth daily.    LISINOPRIL (PRINIVIL,ZESTRIL) 40 MG TABLET    Take 40 mg by mouth daily. For HTN   METOPROLOL SUCCINATE (TOPROL-XL) 100 MG 24 HR TABLET    Take 100 mg by mouth daily. Take with or immediately following a meal.   MULTIPLE VITAMINS-MINERALS (CERTAVITE/ANTIOXIDANTS PO)    Take 1 tablet by mouth daily.  Modified Medications   No medications on file  Discontinued Medications   No medications on file    Physical Exam: Filed Vitals:   02/04/15 1645  BP: 151/77  Pulse: 58  Temp: 97.6 F (36.4 C)  TempSrc: Tympanic  Resp: 18   There is no weight on file to calculate BMI.  Physical Exam  Constitutional: She is oriented to person, place, and time. She appears well-developed and well-nourished.  HENT:  Head: Normocephalic and atraumatic.  Right Ear: External ear normal.  Left Ear: External ear normal.  Eyes: Conjunctivae and EOM are  normal. Pupils are equal, round, and reactive to light.  Neck: Neck supple. No JVD present. No tracheal deviation present. No thyromegaly present.  Cardiovascular: Normal rate.   Murmur heard.  Systolic murmur is present with a grade of 2/6  Pulmonary/Chest: Effort normal and breath sounds normal. No respiratory distress. She has no rales.  Abdominal: Soft. Bowel sounds are normal. She exhibits no mass. There is no tenderness.  Musculoskeletal: She exhibits no edema or tenderness.  Unstable gait. Using rolling walker. Mild scoliosis of thoracic and lumbar spine.  Lymphadenopathy:    She has no cervical adenopathy.  Neurological: She is  alert and oriented to person, place, and time. She has normal reflexes. No cranial nerve deficit. She exhibits normal muscle tone. Coordination normal.  Demented  Skin: Skin is warm and dry. No rash noted. No erythema.  Psychiatric: She has a normal mood and affect. Her behavior is normal. Judgment and thought content normal. Her speech is not delayed and not slurred. She is not agitated, not aggressive, not hyperactive, not slowed, not withdrawn, not actively hallucinating and not combative. Thought content is not paranoid and not delusional. Cognition and memory are impaired. She exhibits abnormal recent memory.    Labs reviewed: Basic Metabolic Panel:  Recent Labs  10/28/14  NA 138  K 4.0  BUN 10  CREATININE 0.7    Liver Function Tests:  Recent Labs  10/28/14  AST 11*  ALT 9  ALKPHOS 52    CBC: No results for input(s): WBC, NEUTROABS, HGB, HCT, MCV, PLT in the last 8760 hours.  Lab Results  Component Value Date   TSH 0.82 10/28/2014   Lab Results  Component Value Date   HGBA1C 6.2* 10/28/2014   Lab Results  Component Value Date   CHOL 191 05/20/2012   HDL 38* 05/20/2012   LDLCALC 118* 05/20/2012   TRIG 174* 05/20/2012   CHOLHDL 5.0 05/20/2012    Significant Diagnostic Results since last visit: none  Patient Care Team: Estill Dooms, MD as PCP - General (Internal Medicine)  Assessment/Plan Problem List Items Addressed This Visit    Urinary incontinence - Primary    Managed with adult depends. Off Myrbetriq.      Type 2 diabetes, controlled, with neuropathy (HCC) (Chronic)    Last Hgb A1c 6.2 10/28/14, diet controlled.       HTN (hypertension) (Chronic)    controlled, continue Lisinopril 40mg , Metoprolol succinate 100mg        Dementia (Chronic)    Gradual decline, continue Namenda and Aricept, SNF for care needs.       Constipation    Stable, contineu Fiber Laxative daily          Family/ staff Communication: continue SNF for care needs.    Labs/tests ordered: none  ManXie Mast NP [facility] [address], [place] [address] On Call:  [phone] & follow prompts after 5pm & weekends Office Phone:  [phone] Office Fax:  [phone]

## 2015-02-04 NOTE — Assessment & Plan Note (Signed)
Stable, contineu Fiber Laxative daily 

## 2015-02-04 NOTE — Assessment & Plan Note (Signed)
Gradual decline, continue Namenda and Aricept, SNF for care needs.  

## 2015-03-11 ENCOUNTER — Encounter: Payer: Self-pay | Admitting: Nurse Practitioner

## 2015-03-11 ENCOUNTER — Non-Acute Institutional Stay (SKILLED_NURSING_FACILITY): Payer: Medicare Other | Admitting: Nurse Practitioner

## 2015-03-11 DIAGNOSIS — E1140 Type 2 diabetes mellitus with diabetic neuropathy, unspecified: Secondary | ICD-10-CM

## 2015-03-11 DIAGNOSIS — F0390 Unspecified dementia without behavioral disturbance: Secondary | ICD-10-CM | POA: Diagnosis not present

## 2015-03-11 DIAGNOSIS — K5900 Constipation, unspecified: Secondary | ICD-10-CM

## 2015-03-11 DIAGNOSIS — N3942 Incontinence without sensory awareness: Secondary | ICD-10-CM | POA: Diagnosis not present

## 2015-03-11 DIAGNOSIS — I10 Essential (primary) hypertension: Secondary | ICD-10-CM | POA: Diagnosis not present

## 2015-03-11 NOTE — Progress Notes (Signed)
Patient ID: Teresa Richards, female   DOB: [birth year], 82 y.o.   MRN: [id number]  Location:  SNF [facility] Provider:  Marlana Latus NP  Code Status:  DNR Goals of care: Advanced Directive information Does patient have an advance directive?: Yes, Type of Advance Directive: [place];Out of facility DNR (pink MOST or yellow form)  Chief Complaint  Patient presents with  . Medical Management of Chronic Issues    Routine Visit     HPI: Patient is a 82 y.o. female seen in the SNF at [facility] today for evaluation of Hx of dementia, likely vascular dementia, taking Aricept presently. Controlled blood pressure while on Lisinopril and Metoprolol. Diet controlled blood sugar, last Hgb 6s. Denied constipation. Managing her urinary incontinence with adult depends, off Myrbetriq.   Review of Systems  HENT: Negative.   Respiratory: Negative.   Gastrointestinal: Negative.   Genitourinary: Positive for frequency. Negative for dysuria and flank pain.       Episodes of incontinence  Skin: Negative.   Neurological: Negative for tremors, seizures, weakness and headaches.       Dementia    Past Medical History  Diagnosis Date  . Diabetes mellitus without complication ([place])   . Hypertension   . Melanoma ([place]) 1960    left shoulder skin  . Dementia   . Peripheral neuropathy ([place])   . Glaucoma   . Atrial fibrillation ([place])   . Bradycardia   . Umbilical hernia   . Lower back pain   . Muscle pain     Patient Active Problem List   Diagnosis Date Noted  . Constipation 11/16/2014  . Urinary incontinence 11/09/2014  . Abnormal TSH 10/26/2014  . Diabetic neuropathy ([place]) 10/27/2013  . Idiopathic scoliosis 10/27/2013  . Type 2 diabetes, controlled, with neuropathy ([place]) 04/21/2013  . Vertebral fracture 11/11/2012  . DNR (do not resuscitate) 07/04/2012  . Stroke ([place]) 05/19/2012  . HTN (hypertension) 05/19/2012  . 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 Anxiety state 05/13/2012   . Lumbago 04/25/2012  . Abnormality of gait 04/25/2012  . Fall at nursing home 03/08/2012  . Subdural hematoma ([place]) 03/08/2012  . Dementia 03/08/2012    Allergies  Allergen Reactions  . Codeine Other (See Comments)    Per MAR    Medications: [name] Medications  New Prescriptions   No medications on file  Previous Medications   ACETAMINOPHEN (TYLENOL) 325 MG TABLET    Take 650 mg by mouth every 4 (four) hours as needed for moderate pain.   ASPIRIN 81 MG TABLET    Take 81 mg by mouth daily.   DONEPEZIL (ARICEPT) 10 MG TABLET    Take 10 mg by mouth daily.    IPRATROPIUM-ALBUTEROL (DUONEB) 0.5-2.5 (3) MG/3ML SOLN    Take 3 mLs by nebulization 2 (two) times daily as needed (for Shortness of breath).   LISINOPRIL (PRINIVIL,ZESTRIL) 40 MG TABLET    Take 40 mg by mouth daily. For HTN   MEMANTINE (NAMENDA) 10 MG TABLET    Take 10 mg by mouth 2 (two) times daily.   METHYLCELLULOSE, LAXATIVE, (CITRUCEL PO)    Take 1 tablet by mouth daily.   METOPROLOL SUCCINATE (TOPROL-XL) 100 MG 24 HR TABLET    Take 100 mg by mouth daily. Take with or immediately following a meal.   MULTIPLE VITAMINS-MINERALS (CERTAVITE/ANTIOXIDANTS PO)    Take 1 tablet by mouth daily.  Modified Medications   No medications on file  Discontinued Medications   No  medications on file    Physical Exam: Filed Vitals:   03/11/15 1114  BP: 167/73  Pulse: 55  Temp: 97.7 F (36.5 C)  TempSrc: Oral  Resp: 20  Weight: 123 lb 1.6 oz (55.838 kg)  SpO2: 96%   Body mass index is 24.85 kg/(m^2).  Physical Exam  Constitutional: She is oriented to person, place, and time. She appears well-developed and well-nourished.  HENT:  Head: Normocephalic and atraumatic.  Right Ear: External ear normal.  Left Ear: External ear normal.  Eyes: Conjunctivae and EOM are normal. Pupils are equal, round, and reactive to light.  Neck: Neck supple. No JVD present. No tracheal deviation present. No thyromegaly present.    Cardiovascular: Normal rate.   Murmur heard.  Systolic murmur is present with a grade of 2/6  Pulmonary/Chest: Effort normal and breath sounds normal. No respiratory distress. She has no rales.  Abdominal: Soft. Bowel sounds are normal. She exhibits no mass. There is no tenderness.  Musculoskeletal: She exhibits no edema or tenderness.  Unstable gait. Using rolling walker. Mild scoliosis of thoracic and lumbar spine.  Lymphadenopathy:    She has no cervical adenopathy.  Neurological: She is alert and oriented to person, place, and time. She has normal reflexes. No cranial nerve deficit. She exhibits normal muscle tone. Coordination normal.  Demented  Skin: Skin is warm and dry. No rash noted. No erythema.  Psychiatric: She has a normal mood and affect. Her behavior is normal. Judgment and thought content normal. Her speech is not delayed and not slurred. She is not agitated, not aggressive, not hyperactive, not slowed, not withdrawn, not actively hallucinating and not combative. Thought content is not paranoid and not delusional. Cognition and memory are impaired. She exhibits abnormal recent memory.    Labs reviewed: Basic Metabolic Panel:  Recent Labs  10/28/14  NA 138  K 4.0  BUN 10  CREATININE 0.7    Liver Function Tests:  Recent Labs  10/28/14  AST 11*  ALT 9  ALKPHOS 52    CBC: No results for input(s): WBC, NEUTROABS, HGB, HCT, MCV, PLT in the last 8760 hours.  Lab Results  Component Value Date   TSH 0.82 10/28/2014   Lab Results  Component Value Date   HGBA1C 6.2* 10/28/2014   Lab Results  Component Value Date   CHOL 191 05/20/2012   HDL 38* 05/20/2012   LDLCALC 118* 05/20/2012   TRIG 174* 05/20/2012   CHOLHDL 5.0 05/20/2012    Significant Diagnostic Results since last visit: none  Patient Care Team: Estill Dooms, MD as PCP - General (Internal Medicine)  Assessment/Plan Problem List Items Addressed This Visit    Dementia - Primary (Chronic)     Gradual decline, continue Namenda and Aricept, SNF for care needs.       Relevant Medications   memantine (NAMENDA) 10 MG tablet   HTN (hypertension) (Chronic)    controlled, continue Lisinopril 40mg , Metoprolol succinate 100mg        Type 2 diabetes, controlled, with neuropathy (HCC) (Chronic)    Last Hgb A1c 6.2 10/28/14, diet controlled.       Urinary incontinence    Managed with adult depends. Off Myrbetriq.      Constipation    Stable, contineu Fiber Laxative daily          Family/ staff Communication: continue SNF for care needs.   Labs/tests ordered: none  ManXie Mast NP [facility] [address],  [place] [address] On Call:  [phone] & follow prompts after 5pm & weekends Office Phone:  [phone] Office Fax:  [phone]

## 2015-03-11 NOTE — Assessment & Plan Note (Signed)
Stable, contineu Fiber Laxative daily 

## 2015-03-11 NOTE — Assessment & Plan Note (Signed)
Gradual decline, continue Namenda and Aricept, SNF for care needs.  

## 2015-03-11 NOTE — Assessment & Plan Note (Signed)
Last Hgb A1c 6.2 10/28/14, diet controlled.  

## 2015-03-11 NOTE — Assessment & Plan Note (Signed)
controlled, continue Lisinopril 40mg , Metoprolol succinate 100mg 

## 2015-03-11 NOTE — Assessment & Plan Note (Signed)
Managed with adult depends. Off Myrbetriq.

## 2015-03-18 ENCOUNTER — Encounter: Payer: Self-pay | Admitting: Internal Medicine

## 2015-03-18 ENCOUNTER — Non-Acute Institutional Stay (SKILLED_NURSING_FACILITY): Payer: Medicare Other | Admitting: Internal Medicine

## 2015-03-18 DIAGNOSIS — I10 Essential (primary) hypertension: Secondary | ICD-10-CM | POA: Diagnosis not present

## 2015-03-18 DIAGNOSIS — F0390 Unspecified dementia without behavioral disturbance: Secondary | ICD-10-CM | POA: Diagnosis not present

## 2015-03-18 DIAGNOSIS — E1140 Type 2 diabetes mellitus with diabetic neuropathy, unspecified: Secondary | ICD-10-CM

## 2015-03-18 NOTE — Progress Notes (Signed)
Location:  [place] Room Number: N 27 Place of Service:  SNF (31) Provider:  Jeanmarie Hubert MD  Estill Dooms, MD  Patient Care Team: Estill Dooms, MD as PCP - General (Internal Medicine)  Extended Emergency Contact Information Primary Emergency Contact: Hanzaker,[name] Address: [address]          [place], [place] [address] Johnnette Litter of [place] Phone: [phone] Mobile Phone: [phone] Relation: Daughter Secondary Emergency [place]: [place] [address] [place] of [place] Phone: [phone] Relation: Grandson  Code Status:  DNR Goals of care: Advanced Directive information Advanced Directives 03/18/2015  Does patient have an advance directive? Yes  Type of Paramedic of [place];Out of facility DNR (pink MOST or yellow form)  Does patient want to make changes to advanced directive? No - Patient declined  Copy of advanced directive(s) in chart? Yes     Chief Complaint  Patient presents with  . Medical Management of Chronic Issues    HPI:  Pt is a 82 y.o. female seen today for medical management of chronic diseases.    Dementia, without behavioral disturbance - unchanged. Patient remains conversational.  Essential hypertension - controlled  Type 2 diabetes, controlled, with neuropathy ([place]) - controlled     Past Medical History  Diagnosis Date  . Diabetes mellitus without complication ([place])   . Hypertension   . Melanoma ([place]) 1960    left shoulder skin  . Dementia   . Peripheral neuropathy ([place])   . Glaucoma   . Atrial fibrillation ([place])   . Bradycardia   . Umbilical hernia   . Lower back pain   . Muscle pain    Past Surgical History  Procedure Laterality Date  . Skin cancer excision Left 1960's    melanoma shoulder    Allergies  Allergen Reactions  . Codeine Other (See Comments)    Per [facility]      Medication List       This list is accurate  as of: 03/18/15  2:59 PM.  Always use your most recent med list.               acetaminophen 325 MG tablet  Commonly known as:  TYLENOL  Take 650 mg by mouth every 4 (four) hours as needed for moderate pain.     aspirin 81 MG tablet  Take 81 mg by mouth daily.     CERTAVITE/ANTIOXIDANTS PO  Take 1 tablet by mouth daily.     CITRUCEL PO  Take 1 tablet by mouth daily.     donepezil 10 MG tablet  Commonly known as:  ARICEPT  Take 10 mg by mouth daily.     ipratropium-albuterol 0.5-2.5 (3) MG/3ML Soln  Commonly known as:  DUONEB  Take 3 mLs by nebulization 2 (two) times daily as needed (for Shortness of breath).     lisinopril 40 MG tablet  Commonly known as:  PRINIVIL,ZESTRIL  Take 40 mg by mouth daily. For HTN     memantine 10 MG tablet  Commonly known as:  NAMENDA  Take 10 mg by mouth 2 (two) times daily.     metoprolol succinate 100 MG 24 hr tablet  Commonly known as:  TOPROL-XL  Take 100 mg by mouth daily. Take with or immediately following a meal.        Review of Systems  Constitutional: Positive for fatigue.  HENT: Negative.  Eyes: Positive for visual disturbance (corrective lenses).  Respiratory: Negative.   Gastrointestinal: Negative.   Endocrine: Positive for polyuria.       Diabetic with neuropathy  Genitourinary: Positive for frequency. Negative for dysuria and flank pain.       Episodes of incontinence  Musculoskeletal: Positive for gait problem.  Skin: Negative.   Allergic/Immunologic: Negative.   Neurological: Negative for tremors, seizures, syncope, facial asymmetry, speech difficulty, weakness, light-headedness, numbness and headaches.       Dementia  Psychiatric/Behavioral: Positive for confusion and sleep disturbance.    Immunization History  Administered Date(s) Administered  . Influenza-Unspecified 11/10/2012, 10/27/2013, 10/28/2014  . PPD Test 11/17/2011, 11/16/2014  . Pneumococcal Polysaccharide-23 11/23/1999  . Td 10/22/2000    Pertinent  Health Maintenance Due  Topic Date Due  . OPHTHALMOLOGY EXAM  09/24/1930  . DEXA SCAN  09/23/1985  . PNA vac Low Risk Adult (2 of 2 - PCV13) 11/22/2000  . HEMOGLOBIN A1C  04/28/2015  . INFLUENZA VACCINE  08/23/2015  . FOOT EXAM  10/26/2015   Fall Risk  11/29/2014 11/09/2014 10/26/2014 10/27/2013 10/27/2013  Falls in the past year? No No No No No  Risk for fall due to : - - - Impaired mobility;Impaired balance/gait;Mental status change -   Functional Status Survey:    Filed Vitals:   03/18/15 1448  BP: 142/70  Pulse: 58  Temp: 97.5 F (36.4 C)  TempSrc: Oral  Resp: 18  SpO2: 96%   There is no weight on file to calculate BMI. Physical Exam  Constitutional: She appears well-developed and well-nourished.  HENT:  Head: Normocephalic and atraumatic.  Right Ear: External ear normal.  Left Ear: External ear normal.  Eyes: Conjunctivae and EOM are normal. Pupils are equal, round, and reactive to light.  Neck: Neck supple. No JVD present. No tracheal deviation present. No thyromegaly present.  Cardiovascular: Normal rate.   Murmur heard.  Systolic murmur is present with a grade of 2/6  Pulmonary/Chest: Effort normal and breath sounds normal. No respiratory distress. She has no rales.  Abdominal: Soft. Bowel sounds are normal. She exhibits no mass. There is no tenderness.  Musculoskeletal: She exhibits no edema or tenderness.  Unstable gait. Using rolling walker. Mild scoliosis of thoracic and lumbar spine.  Lymphadenopathy:    She has no cervical adenopathy.  Neurological: She is alert. She has normal reflexes. No cranial nerve deficit. She exhibits normal muscle tone. Coordination normal.  Demented  Skin: Skin is warm and dry. No rash noted. No erythema.  Psychiatric: She has a normal mood and affect. Her behavior is normal. Thought content normal. Her speech is not delayed and not slurred. She is not agitated, not aggressive, not hyperactive, not slowed, not  withdrawn, not actively hallucinating and not combative. Thought content is not paranoid and not delusional. Cognition and memory are impaired. She exhibits abnormal recent memory.    Labs reviewed:  Recent Labs  10/28/14  NA 138  K 4.0  BUN 10  CREATININE 0.7    Recent Labs  10/28/14  AST 11*  ALT 9  ALKPHOS 52   No results for input(s): WBC, NEUTROABS, HGB, HCT, MCV, PLT in the last 8760 hours. Lab Results  Component Value Date   TSH 0.82 10/28/2014   Lab Results  Component Value Date   HGBA1C 6.2* 10/28/2014   Lab Results  Component Value Date   CHOL 191 05/20/2012   HDL 38* 05/20/2012   LDLCALC 118* 05/20/2012   TRIG 174* 05/20/2012  CHOLHDL 5.0 05/20/2012   Assessment and plan 1. Dementia, without behavioral disturbance Remain on donepezil and memantine  2. Essential hypertension Remain on current medications  3. Type 2 diabetes, controlled, with neuropathy ([place]) Stay on current medications

## 2015-04-01 ENCOUNTER — Encounter: Payer: Self-pay | Admitting: Nurse Practitioner

## 2015-04-01 ENCOUNTER — Non-Acute Institutional Stay (SKILLED_NURSING_FACILITY): Payer: Medicare Other | Admitting: Nurse Practitioner

## 2015-04-01 DIAGNOSIS — K5900 Constipation, unspecified: Secondary | ICD-10-CM | POA: Diagnosis not present

## 2015-04-01 DIAGNOSIS — E1140 Type 2 diabetes mellitus with diabetic neuropathy, unspecified: Secondary | ICD-10-CM

## 2015-04-01 DIAGNOSIS — I10 Essential (primary) hypertension: Secondary | ICD-10-CM

## 2015-04-01 DIAGNOSIS — N3942 Incontinence without sensory awareness: Secondary | ICD-10-CM | POA: Diagnosis not present

## 2015-04-01 DIAGNOSIS — F0390 Unspecified dementia without behavioral disturbance: Secondary | ICD-10-CM | POA: Diagnosis not present

## 2015-04-01 NOTE — Assessment & Plan Note (Signed)
Managed with adult depends. Off Myrbetriq.

## 2015-04-01 NOTE — Assessment & Plan Note (Signed)
Gradual decline, continue Namenda and Aricept, SNF for care needs.  

## 2015-04-01 NOTE — Assessment & Plan Note (Signed)
Stable, contineu Fiber Laxative daily 

## 2015-04-01 NOTE — Progress Notes (Signed)
Patient ID: Teresa Richards, female   DOB: 06-13-1918, 82 y.o.   MRN: [id number]  Location:  [place] Room Number: [place] of Service:  SNF (31) Provider: Lennie Odor Joliene Salvador NP  GREEN, Viviann Spare, MD  Patient Care Team: Estill Dooms, MD as PCP - General (Internal Medicine)  Extended Emergency Contact Information Primary Emergency Contact: Hanzaker,[name] Address: [address]          [place], [place] [address] Johnnette Litter of [place] Phone: [phone] Mobile Phone: [phone] Relation: Daughter Secondary Emergency [place]: [place] [address] [place] of [place] Phone: [phone] Relation: Grandson  Code Status:  DNR Goals of care: Advanced Directive information Advanced Directives 04/01/2015  Does patient have an advance directive? Yes  Type of Paramedic of [place];Living will;Out of facility DNR (pink MOST or yellow form)  Does patient want to make changes to advanced directive? -  Copy of advanced directive(s) in chart? Yes     Chief Complaint  Patient presents with  . Medical Management of Chronic Issues    Routine Visit    HPI:  Pt is a 82 y.o. female seen today for medical management of chronic diseases.  Hx of dementia, likely vascular dementia, taking Aricept presently. Controlled blood pressure while on Lisinopril and Metoprolol. Diet controlled blood sugar, last Hgb 6s. Denied constipation. Managing her urinary incontinence with adult depends, off Myrbetriq.     Past Medical History  Diagnosis Date  . Diabetes mellitus without complication ([place])   . Hypertension   . Melanoma ([place]) 1960    left shoulder skin  . Dementia   . Peripheral neuropathy ([place])   . Glaucoma   . Atrial fibrillation ([place])   . Bradycardia   . Umbilical hernia   . Lower back pain   . Muscle pain    Past Surgical History  Procedure Laterality Date  . Skin cancer excision Left 1960's   melanoma shoulder    Allergies  Allergen Reactions  . Codeine Other (See Comments)    Per [facility]      Medication List       This list is accurate as of: 04/01/15  1:52 PM.  Always use your most recent med list.               acetaminophen 325 MG tablet  Commonly known as:  TYLENOL  Take 650 mg by mouth every 4 (four) hours as needed for moderate pain.     aspirin 81 MG tablet  Take 81 mg by mouth daily.     CERTAVITE/ANTIOXIDANTS PO  Take 1 tablet by mouth daily.     CITRUCEL PO  Take 1 tablet by mouth daily.     donepezil 10 MG tablet  Commonly known as:  ARICEPT  Take 10 mg by mouth daily.     ipratropium-albuterol 0.5-2.5 (3) MG/3ML Soln  Commonly known as:  DUONEB  Take 3 mLs by nebulization 2 (two) times daily as needed (for Shortness of breath).     lisinopril 40 MG tablet  Commonly known as:  PRINIVIL,ZESTRIL  Take 40 mg by mouth daily. For HTN     memantine 10 MG tablet  Commonly known as:  NAMENDA  Take 10 mg by mouth 2 (two) times daily.     metoprolol succinate 100 MG 24 hr tablet  Commonly known as:  TOPROL-XL  Take 100 mg by mouth daily. Take  with or immediately following a meal.     sennosides-docusate sodium 8.6-50 MG tablet  Commonly known as:  SENOKOT-S  Take 1 tablet by mouth 2 (two) times daily. Hold for loose stool        Review of Systems  HENT: Negative.   Respiratory: Negative.   Gastrointestinal: Negative.   Genitourinary: Positive for frequency. Negative for dysuria and flank pain.       Episodes of incontinence  Skin: Negative.   Neurological: Negative for tremors, seizures, weakness and headaches.       Dementia    Immunization History  Administered Date(s) Administered  . Influenza-Unspecified 11/10/2012, 10/27/2013, 10/28/2014  . PPD Test 11/17/2011, 11/16/2014  . Pneumococcal Polysaccharide-23 11/23/1999  . Td 10/22/2000   Pertinent  Health Maintenance Due  Topic Date Due  . OPHTHALMOLOGY EXAM  09/24/1930  .  DEXA SCAN  09/23/1985  . PNA vac Low Risk Adult (2 of 2 - PCV13) 11/22/2000  . HEMOGLOBIN A1C  04/28/2015  . INFLUENZA VACCINE  08/23/2015  . FOOT EXAM  10/26/2015   Fall Risk  11/29/2014 11/09/2014 10/26/2014 10/27/2013 10/27/2013  Falls in the past year? No No No No No  Risk for fall due to : - - - Impaired mobility;Impaired balance/gait;Mental status change -   Functional Status Survey:    Filed Vitals:   04/01/15 0903  BP: 150/90  Pulse: 56  Temp: 98.1 F (36.7 C)  TempSrc: Oral  Resp: 16  Height: 4\' 11"  (1.499 m)  Weight: 120 lb (54.432 kg)  SpO2: 97%   Body mass index is 24.22 kg/(m^2). Physical Exam  Constitutional: She is oriented to person, place, and time. She appears well-developed and well-nourished.  HENT:  Head: Normocephalic and atraumatic.  Right Ear: External ear normal.  Left Ear: External ear normal.  Eyes: Conjunctivae and EOM are normal. Pupils are equal, round, and reactive to light.  Neck: Neck supple. No JVD present. No tracheal deviation present. No thyromegaly present.  Cardiovascular: Normal rate.   Murmur heard.  Systolic murmur is present with a grade of 2/6  Pulmonary/Chest: Effort normal and breath sounds normal. No respiratory distress. She has no rales.  Abdominal: Soft. Bowel sounds are normal. She exhibits no mass. There is no tenderness.  Musculoskeletal: She exhibits no edema or tenderness.  Unstable gait. Using rolling walker. Mild scoliosis of thoracic and lumbar spine.  Lymphadenopathy:    She has no cervical adenopathy.  Neurological: She is alert and oriented to person, place, and time. She has normal reflexes. No cranial nerve deficit. She exhibits normal muscle tone. Coordination normal.  Demented  Skin: Skin is warm and dry. No rash noted. No erythema.  Psychiatric: She has a normal mood and affect. Her behavior is normal. Judgment and thought content normal. Her speech is not delayed and not slurred. She is not agitated, not  aggressive, not hyperactive, not slowed, not withdrawn, not actively hallucinating and not combative. Thought content is not paranoid and not delusional. Cognition and memory are impaired. She exhibits abnormal recent memory.    Labs reviewed:  Recent Labs  10/28/14  NA 138  K 4.0  BUN 10  CREATININE 0.7    Recent Labs  10/28/14  AST 11*  ALT 9  ALKPHOS 52   No results for input(s): WBC, NEUTROABS, HGB, HCT, MCV, PLT in the last 8760 hours. Lab Results  Component Value Date   TSH 0.82 10/28/2014   Lab Results  Component Value Date   HGBA1C 6.2*  10/28/2014   Lab Results  Component Value Date   CHOL 191 05/20/2012   HDL 38* 05/20/2012   LDLCALC 118* 05/20/2012   TRIG 174* 05/20/2012   CHOLHDL 5.0 05/20/2012    Significant Diagnostic Results in last 30 days:  No results found.  Assessment/Plan Urinary incontinence Managed with adult depends. Off Myrbetriq.  Type 2 diabetes, controlled, with neuropathy ([place]) Last Hgb A1c 6.2 10/28/14, diet controlled.   HTN (hypertension) controlled, continue Lisinopril 40mg , Metoprolol succinate 100mg    Dementia Gradual decline, continue Namenda and Aricept, SNF for care needs.   Constipation Stable, contineu Fiber Laxative daily    Family/ staff Communication: continue SNF for care needs.   Labs/tests ordered: none

## 2015-04-01 NOTE — Assessment & Plan Note (Signed)
controlled, continue Lisinopril 40mg , Metoprolol succinate 100mg 

## 2015-04-01 NOTE — Assessment & Plan Note (Signed)
Last Hgb A1c 6.2 10/28/14, diet controlled.  

## 2015-04-26 ENCOUNTER — Encounter: Payer: Medicare Other | Admitting: Internal Medicine

## 2015-05-03 ENCOUNTER — Non-Acute Institutional Stay (SKILLED_NURSING_FACILITY): Payer: Medicare Other | Admitting: Nurse Practitioner

## 2015-05-03 ENCOUNTER — Encounter: Payer: Self-pay | Admitting: Nurse Practitioner

## 2015-05-03 DIAGNOSIS — I10 Essential (primary) hypertension: Secondary | ICD-10-CM

## 2015-05-03 DIAGNOSIS — F0390 Unspecified dementia without behavioral disturbance: Secondary | ICD-10-CM

## 2015-05-03 DIAGNOSIS — N3942 Incontinence without sensory awareness: Secondary | ICD-10-CM

## 2015-05-03 DIAGNOSIS — R7989 Other specified abnormal findings of blood chemistry: Secondary | ICD-10-CM | POA: Diagnosis not present

## 2015-05-03 DIAGNOSIS — E1140 Type 2 diabetes mellitus with diabetic neuropathy, unspecified: Secondary | ICD-10-CM | POA: Diagnosis not present

## 2015-05-03 DIAGNOSIS — K5900 Constipation, unspecified: Secondary | ICD-10-CM

## 2015-05-03 NOTE — Assessment & Plan Note (Signed)
controlled, continue Lisinopril 40mg , Metoprolol succinate 100mg 

## 2015-05-03 NOTE — Assessment & Plan Note (Signed)
Last Hgb A1c 6.2 10/28/14, diet controlled. Update CBC, CMP, Hgb a1c

## 2015-05-03 NOTE — Progress Notes (Signed)
Patient ID: Teresa Richards, female   DOB: 29-Nov-1918, 82 y.o.   MRN: [id number]  Location:  [place] Room Number: N 27 Place of Service:  SNF (31) Provider: Lennie Odor Mast NP  GREEN, Viviann Spare, MD  Patient Care Team: Estill Dooms, MD as PCP - General (Internal Medicine)  Extended Emergency Contact Information Primary Emergency Contact: Hanzaker,[name] Address: [address]          [place], [place] [address] Johnnette Litter of [place] Phone: [phone] Mobile Phone: [phone] Relation: Daughter Secondary Emergency [place]: [place] [address] [place] Phone: [phone] Relation: Grandson  Code Status:  DNR Goals of care: Advanced Directive information Advanced Directives 05/03/2015  Does patient have an advance directive? Yes  Type of Paramedic of [place];Living will;Out of facility DNR (pink MOST or yellow form)  Does patient want to make changes to advanced directive? No - Patient declined  Copy of advanced directive(s) in chart? Yes     Chief Complaint  Patient presents with  . Medical Management of Chronic Issues    Routine Visit    HPI:  Pt is a 82 y.o. female seen today for medical management of chronic diseases.  Hx of dementia, likely vascular dementia, taking Aricept presently. Controlled blood pressure while on Lisinopril and Metoprolol. Diet controlled blood sugar, last Hgb 6s. Denied constipation. Managing her urinary incontinence with adult depends, off Myrbetriq.     Past Medical History  Diagnosis Date  . Diabetes mellitus without complication ([place])   . Hypertension   . Melanoma ([place]) 1960    left shoulder skin  . Dementia   . Peripheral neuropathy ([place])   . Glaucoma   . Atrial fibrillation ([place])   . Bradycardia   . Umbilical hernia   . Lower back pain   . Muscle pain    Past Surgical History  Procedure Laterality Date  . Skin cancer excision  Left 1960's    melanoma shoulder    Allergies  Allergen Reactions  . Codeine Other (See Comments)    Per MAR      Medication List       This list is accurate as of: 05/03/15  3:28 PM.  Always use your most recent med list.               acetaminophen 325 MG tablet  Commonly known as:  TYLENOL  Take 650 mg by mouth every 4 (four) hours as needed for moderate pain.     aspirin 81 MG tablet  Take 81 mg by mouth daily.     CERTAVITE/ANTIOXIDANTS PO  Take 1 tablet by mouth daily.     CITRUCEL PO  Take 1 tablet by mouth daily.     donepezil 10 MG tablet  Commonly known as:  ARICEPT  Take 10 mg by mouth daily.     ipratropium-albuterol 0.5-2.5 (3) MG/3ML Soln  Commonly known as:  DUONEB  Take 3 mLs by nebulization 2 (two) times daily as needed (for Shortness of breath).     lisinopril 40 MG tablet  Commonly known as:  PRINIVIL,ZESTRIL  Take 40 mg by mouth daily. For HTN     memantine 10 MG tablet  Commonly known as:  NAMENDA  Take 10 mg by mouth 2 (two) times daily.     metoprolol succinate 100 MG 24 hr tablet  Commonly known as:  TOPROL-XL  Take  100 mg by mouth daily. Take with or immediately following a meal.     sennosides-docusate sodium 8.6-50 MG tablet  Commonly known as:  SENOKOT-S  Take 1 tablet by mouth 2 (two) times daily. Hold for loose stool        Review of Systems  HENT: Negative.   Respiratory: Negative.   Gastrointestinal: Negative.   Genitourinary: Positive for frequency. Negative for dysuria and flank pain.       Episodes of incontinence  Skin: Negative.   Neurological: Negative for tremors, seizures, weakness and headaches.       Dementia    Immunization History  Administered Date(s) Administered  . Influenza-Unspecified 11/10/2012, 10/27/2013, 10/28/2014  . PPD Test 11/17/2011, 11/16/2014  . Pneumococcal Polysaccharide-23 11/23/1999  . Td 10/22/2000   Pertinent  Health Maintenance Due  Topic Date Due  . OPHTHALMOLOGY EXAM   09/24/1930  . DEXA SCAN  09/23/1985  . PNA vac Low Risk Adult (2 of 2 - PCV13) 11/22/2000  . HEMOGLOBIN A1C  04/28/2015  . INFLUENZA VACCINE  08/23/2015  . FOOT EXAM  10/26/2015   Fall Risk  11/29/2014 11/09/2014 10/26/2014 10/27/2013 10/27/2013  Falls in the past year? No No No No No  Risk for fall due to : - - - Impaired mobility;Impaired balance/gait;Mental status change -   Functional Status Survey:    Filed Vitals:   05/03/15 1051  BP: 166/74  Pulse: 61  Temp: 96.8 F (36 C)  TempSrc: Oral  Resp: 18  Height: 4\' 11"  (1.499 m)  Weight: 120 lb (54.432 kg)  SpO2: 96%   Body mass index is 24.22 kg/(m^2). Physical Exam  Constitutional: She is oriented to person, place, and time. She appears well-developed and well-nourished.  HENT:  Head: Normocephalic and atraumatic.  Right Ear: External ear normal.  Left Ear: External ear normal.  Eyes: Conjunctivae and EOM are normal. Pupils are equal, round, and reactive to light.  Neck: Neck supple. No JVD present. No tracheal deviation present. No thyromegaly present.  Cardiovascular: Normal rate.   Murmur heard.  Systolic murmur is present with a grade of 2/6  Pulmonary/Chest: Effort normal and breath sounds normal. No respiratory distress. She has no rales.  Abdominal: Soft. Bowel sounds are normal. She exhibits no mass. There is no tenderness.  Musculoskeletal: She exhibits no edema or tenderness.  Unstable gait. Using rolling walker. Mild scoliosis of thoracic and lumbar spine.  Lymphadenopathy:    She has no cervical adenopathy.  Neurological: She is alert and oriented to person, place, and time. She has normal reflexes. No cranial nerve deficit. She exhibits normal muscle tone. Coordination normal.  Demented  Skin: Skin is warm and dry. No rash noted. No erythema.  Psychiatric: She has a normal mood and affect. Her behavior is normal. Judgment and thought content normal. Her speech is not delayed and not slurred. She is not  agitated, not aggressive, not hyperactive, not slowed, not withdrawn, not actively hallucinating and not combative. Thought content is not paranoid and not delusional. Cognition and memory are impaired. She exhibits abnormal recent memory.    Labs reviewed:  Recent Labs  10/28/14  NA 138  K 4.0  BUN 10  CREATININE 0.7    Recent Labs  10/28/14  AST 11*  ALT 9  ALKPHOS 52   No results for input(s): WBC, NEUTROABS, HGB, HCT, MCV, PLT in the last 8760 hours. Lab Results  Component Value Date   TSH 0.82 10/28/2014   Lab Results  Component  Value Date   HGBA1C 6.2* 10/28/2014   Lab Results  Component Value Date   CHOL 191 05/20/2012   HDL 38* 05/20/2012   LDLCALC 118* 05/20/2012   TRIG 174* 05/20/2012   CHOLHDL 5.0 05/20/2012    Significant Diagnostic Results in last 30 days:  No results found.  Assessment/Plan Abnormal TSH Update TSH  Constipation Stable, contineu Fiber Laxative daily  Dementia Gradual decline, continue Namenda and Aricept, SNF for care needs.   HTN (hypertension) controlled, continue Lisinopril 40mg , Metoprolol succinate 100mg    Type 2 diabetes, controlled, with neuropathy (HCC) Last Hgb A1c 6.2 10/28/14, diet controlled. Update CBC, CMP, Hgb a1c  Urinary incontinence Managed with adult depends. Off Myrbetriq.    Family/ staff Communication: continue SNF for care needs.   Labs/tests ordered: CBC, CMP, TSH, Hgb a1c

## 2015-05-03 NOTE — Assessment & Plan Note (Signed)
Stable, contineu Fiber Laxative daily 

## 2015-05-03 NOTE — Assessment & Plan Note (Signed)
Gradual decline, continue Namenda and Aricept, SNF for care needs.  

## 2015-05-03 NOTE — Assessment & Plan Note (Signed)
Managed with adult depends. Off Myrbetriq.

## 2015-05-03 NOTE — Assessment & Plan Note (Signed)
Update TSH

## 2015-05-05 LAB — CBC AND DIFFERENTIAL
HCT: 39 % (ref 36–46)
Hemoglobin: 12.8 g/dL (ref 12.0–16.0)
Platelets: 132 10*3/uL — AB (ref 150–399)
WBC: 5 10^3/mL

## 2015-05-05 LAB — BASIC METABOLIC PANEL
BUN: 14 mg/dL (ref 4–21)
Creatinine: 0.7 mg/dL (ref 0.5–1.1)
Glucose: 91 mg/dL
POTASSIUM: 4.4 mmol/L (ref 3.4–5.3)
Sodium: 143 mmol/L (ref 137–147)

## 2015-05-05 LAB — TSH: TSH: 0.88 u[IU]/mL (ref 0.41–5.90)

## 2015-05-05 LAB — HEPATIC FUNCTION PANEL
ALK PHOS: 52 U/L (ref 25–125)
ALT: 13 U/L (ref 7–35)
AST: 15 U/L (ref 13–35)
Bilirubin, Total: 0.5 mg/dL

## 2015-06-07 ENCOUNTER — Encounter: Payer: Self-pay | Admitting: Nurse Practitioner

## 2015-06-07 ENCOUNTER — Non-Acute Institutional Stay (SKILLED_NURSING_FACILITY): Payer: Medicare Other | Admitting: Nurse Practitioner

## 2015-06-07 DIAGNOSIS — E0841 Diabetes mellitus due to underlying condition with diabetic mononeuropathy: Secondary | ICD-10-CM | POA: Diagnosis not present

## 2015-06-07 DIAGNOSIS — I10 Essential (primary) hypertension: Secondary | ICD-10-CM | POA: Diagnosis not present

## 2015-06-07 DIAGNOSIS — F411 Generalized anxiety disorder: Secondary | ICD-10-CM | POA: Diagnosis not present

## 2015-06-07 DIAGNOSIS — E1140 Type 2 diabetes mellitus with diabetic neuropathy, unspecified: Secondary | ICD-10-CM

## 2015-06-07 DIAGNOSIS — K5900 Constipation, unspecified: Secondary | ICD-10-CM

## 2015-06-07 DIAGNOSIS — R7989 Other specified abnormal findings of blood chemistry: Secondary | ICD-10-CM

## 2015-06-07 DIAGNOSIS — N3942 Incontinence without sensory awareness: Secondary | ICD-10-CM

## 2015-06-07 DIAGNOSIS — F0390 Unspecified dementia without behavioral disturbance: Secondary | ICD-10-CM

## 2015-06-07 NOTE — Assessment & Plan Note (Signed)
Stable, contineu Fiber Laxative daily, Senna S I bid.

## 2015-06-07 NOTE — Assessment & Plan Note (Signed)
Gradual decline, continue Namenda and Aricept, SNF for care needs.

## 2015-06-07 NOTE — Assessment & Plan Note (Signed)
TSH 0.332 on 05/19/2012 05/05/15 TSH 0.88 No on med

## 2015-06-07 NOTE — Assessment & Plan Note (Signed)
Stable, not on mood stabilizer.

## 2015-06-07 NOTE — Progress Notes (Signed)
Patient ID: Teresa Richards, female   DOB: [birth year], 82 y.o.   MRN: [id number]  Location:  [place] Room Number: N 27 Place of Service:  SNF (31) Provider: Lennie Odor Jaylin Roundy NP  GREEN, Viviann Spare, MD  Patient Care Team: Estill Dooms, MD as PCP - General (Internal Medicine)  Extended Emergency Contact Information Primary Emergency Contact: Hanzaker,[name] Address: [address]          [place], [place] [address] Johnnette Litter of [place] Phone: [phone] Mobile Phone: [phone] Relation: Daughter Secondary Emergency [place]: [place] [address] [place] of [place] Phone: [phone] Relation: Grandson  Code Status:  DNR Goals of care: Advanced Directive information Advanced Directives 06/07/2015  Does patient have an advance directive? Yes  Type of Paramedic of [place];Living will;Out of facility DNR (pink MOST or yellow form)  Does patient want to make changes to advanced directive? No - Patient declined  Copy of advanced directive(s) in chart? Yes     Chief Complaint  Patient presents with  . Medical Management of Chronic Issues    Routine Visit    HPI:  Pt is a 82 y.o. female seen today for medical management of chronic diseases.  Hx of dementia, likely vascular dementia, taking Aricept presently. Controlled blood pressure while on Lisinopril and Metoprolol. Diet controlled blood sugar, last Hgb 6s. Denied constipation. Managing her urinary incontinence with adult depends, off Myrbetriq.   Past Medical History  Diagnosis Date  . Diabetes mellitus without complication ([place])   . Hypertension   . Melanoma ([place]) 1960    left shoulder skin  . Dementia   . Peripheral neuropathy ([place])   . Glaucoma   . Atrial fibrillation ([place])   . Bradycardia   . Umbilical hernia   . Lower back pain   . Muscle pain    Past Surgical History  Procedure Laterality Date  . Skin cancer excision Left  1960's    melanoma shoulder    Allergies  Allergen Reactions  . Codeine Other (See Comments)    Per MAR      Medication List       This list is accurate as of: 06/07/15 12:56 PM.  Always use your most recent med list.               acetaminophen 325 MG tablet  Commonly known as:  TYLENOL  Take 650 mg by mouth every 4 (four) hours as needed for moderate pain.     aspirin 81 MG tablet  Take 81 mg by mouth daily.     CERTAVITE/ANTIOXIDANTS PO  Take 1 tablet by mouth daily.     CITRUCEL PO  Take 1 tablet by mouth daily.     ipratropium-albuterol 0.5-2.5 (3) MG/3ML Soln  Commonly known as:  DUONEB  Take 3 mLs by nebulization 2 (two) times daily as needed (for Shortness of breath).     lisinopril 40 MG tablet  Commonly known as:  PRINIVIL,ZESTRIL  Take 40 mg by mouth daily. For HTN     metoprolol succinate 100 MG 24 hr tablet  Commonly known as:  TOPROL-XL  Take 100 mg by mouth daily. Take with or immediately following a meal.     NAMZARIC 28-10 MG Cp24  Generic drug:  Memantine HCl-Donepezil HCl  Take 1 capsule by mouth at bedtime.     sennosides-docusate sodium 8.6-50 MG tablet  Commonly known as:  SENOKOT-S  Take 1 tablet by mouth 2 (two) times daily. Hold for loose stool        Review of Systems  HENT: Negative.   Respiratory: Negative.   Gastrointestinal: Negative.   Genitourinary: Positive for frequency. Negative for dysuria and flank pain.       Episodes of incontinence  Skin: Negative.   Neurological: Negative for tremors, seizures, weakness and headaches.       Dementia    Immunization History  Administered Date(s) Administered  . Influenza-Unspecified 11/10/2012, 10/27/2013, 10/28/2014  . PPD Test 11/17/2011, 11/16/2014  . Pneumococcal Polysaccharide-23 11/23/1999  . Td 10/22/2000   Pertinent  Health Maintenance Due  Topic Date Due  . OPHTHALMOLOGY EXAM  09/24/1930  . DEXA SCAN  09/23/1985  . PNA vac Low Risk Adult (2 of 2 - PCV13)  11/22/2000  . HEMOGLOBIN A1C  04/28/2015  . INFLUENZA VACCINE  08/23/2015  . FOOT EXAM  10/26/2015   Fall Risk  11/29/2014 11/09/2014 10/26/2014 10/27/2013 10/27/2013  Falls in the past year? No No No No No  Risk for fall due to : - - - Impaired mobility;Impaired balance/gait;Mental status change -   Functional Status Survey:    Filed Vitals:   06/07/15 0849  BP: 144/70  Pulse: 56  Temp: 97.1 F (36.2 C)  TempSrc: Oral  Resp: 16  Height: 4\' 11"  (1.499 m)  Weight: 122 lb 4.8 oz (55.475 kg)  SpO2: 96%   Body mass index is 24.69 kg/(m^2). Physical Exam  Constitutional: She is oriented to person, place, and time. She appears well-developed and well-nourished.  HENT:  Head: Normocephalic and atraumatic.  Right Ear: External ear normal.  Left Ear: External ear normal.  Eyes: Conjunctivae and EOM are normal. Pupils are equal, round, and reactive to light.  Neck: Neck supple. No JVD present. No tracheal deviation present. No thyromegaly present.  Cardiovascular: Normal rate.   Murmur heard.  Systolic murmur is present with a grade of 2/6  Pulmonary/Chest: Effort normal and breath sounds normal. No respiratory distress. She has no rales.  Abdominal: Soft. Bowel sounds are normal. She exhibits no mass. There is no tenderness.  Musculoskeletal: She exhibits no edema or tenderness.  Unstable gait. Using rolling walker. Mild scoliosis of thoracic and lumbar spine.  Lymphadenopathy:    She has no cervical adenopathy.  Neurological: She is alert and oriented to person, place, and time. She has normal reflexes. No cranial nerve deficit. She exhibits normal muscle tone. Coordination normal.  Demented  Skin: Skin is warm and dry. No rash noted. No erythema.  Psychiatric: She has a normal mood and affect. Her behavior is normal. Judgment and thought content normal. Her speech is not delayed and not slurred. She is not agitated, not aggressive, not hyperactive, not slowed, not withdrawn, not  actively hallucinating and not combative. Thought content is not paranoid and not delusional. Cognition and memory are impaired. She exhibits abnormal recent memory.    Labs reviewed:  Recent Labs  10/28/14 05/05/15  NA 138 143  K 4.0 4.4  BUN 10 14  CREATININE 0.7 0.7    Recent Labs  10/28/14 05/05/15  AST 11* 15  ALT 9 13  ALKPHOS 52 52    Recent Labs  05/05/15  WBC 5.0  HGB 12.8  HCT 39  PLT 132*   Lab Results  Component Value Date   TSH 0.88 05/05/2015   Lab Results  Component Value Date   HGBA1C 6.2* 10/28/2014   Lab Results  Component Value Date   CHOL 191 05/20/2012   HDL 38* 05/20/2012   LDLCALC 118* 05/20/2012   TRIG 174* 05/20/2012   CHOLHDL 5.0 05/20/2012    Significant Diagnostic Results in last 30 days:  No results found.  Assessment/Plan Abnormal TSH TSH 0.332 on 05/19/2012 05/05/15 TSH 0.88 No on med   Anxiety state Stable, not on mood stabilizer.   Constipation Stable, contineu Fiber Laxative daily, Senna S I bid.    Dementia Gradual decline, continue Namenda and Aricept, SNF for care needs.    Diabetic neuropathy (HCC) Last Hgb A1c 6.2 10/28/14, 05/05/15 6.2,  diet controlled  HTN (hypertension) controlled, continue Lisinopril 40mg , Metoprolol succinate 100mg     Type 2 diabetes, controlled, with neuropathy (HCC) Last Hgb A1c 6.2 10/28/14, Hgb a1c 6.2 04/2015, diet controlled  Urinary incontinence Persisted. Managed with adult depends. Off Myrbetriq.     Family/ staff Communication: continue SNF for care needs.   Labs/tests ordered: none

## 2015-06-07 NOTE — Assessment & Plan Note (Signed)
Last Hgb A1c 6.2 10/28/14, 05/05/15 6.2,  diet controlled

## 2015-06-07 NOTE — Assessment & Plan Note (Signed)
Last Hgb A1c 6.2 10/28/14, Hgb a1c 6.2 04/2015, diet controlled

## 2015-06-07 NOTE — Assessment & Plan Note (Signed)
controlled, continue Lisinopril 40mg , Metoprolol succinate 100mg 

## 2015-06-07 NOTE — Assessment & Plan Note (Signed)
Persisted. Managed with adult depends. Off Myrbetriq.

## 2015-07-04 ENCOUNTER — Encounter: Payer: Self-pay | Admitting: Internal Medicine

## 2015-07-04 ENCOUNTER — Non-Acute Institutional Stay (SKILLED_NURSING_FACILITY): Payer: Medicare Other | Admitting: Internal Medicine

## 2015-07-04 DIAGNOSIS — R7989 Other specified abnormal findings of blood chemistry: Secondary | ICD-10-CM | POA: Diagnosis not present

## 2015-07-04 DIAGNOSIS — I10 Essential (primary) hypertension: Secondary | ICD-10-CM | POA: Diagnosis not present

## 2015-07-04 DIAGNOSIS — E1140 Type 2 diabetes mellitus with diabetic neuropathy, unspecified: Secondary | ICD-10-CM | POA: Diagnosis not present

## 2015-07-04 DIAGNOSIS — F0390 Unspecified dementia without behavioral disturbance: Secondary | ICD-10-CM

## 2015-07-04 NOTE — Progress Notes (Signed)
Patient ID: Teresa Richards, female   DOB: 01-13-1919, 82 y.o.   MRN: [id number]  Location:  [facility] Room Number: [place] of Service:  SNF (31) Provider: Estill Dooms, MD  Patient Care Team: Estill Dooms, MD as PCP - General (Internal Medicine)  Extended Emergency Contact Information Primary Emergency Contact: Hanzaker,[name] Address: [address]          [place], [place] [address] Johnnette Litter of [place] Phone: [phone] Mobile Phone: [phone] Relation: Daughter Secondary Emergency [place]: [place] [address] [place] of [place] Phone: [phone] Relation: Grandson  Code Status: DO NOT RESUSCITATE Goals of care: Advanced Directive information Advanced Directives 07/04/2015  Does patient have an advance directive? Yes  Type of Paramedic of [place];Living will;Out of facility DNR (pink MOST or yellow form)  Does patient want to make changes to advanced directive? -  Copy of advanced directive(s) in chart? Yes  Pre-existing out of facility DNR order (yellow form or pink MOST form) Yellow form placed in chart (order not valid for inpatient use);Pink MOST form placed in chart (order not valid for inpatient use)     Chief Complaint  Patient presents with  . Medical Management of Chronic Issues    HPI:  Pt is a 82 y.o. female seen today for medical management of chronic diseases.    Dementia is unchanged.Patient is cooperative.  Diabetes is well controlled.  Hypertension is controlled.  Normal TSH in April 2017   Past Medical History  Diagnosis Date  . Diabetes mellitus without complication ([place])   . Hypertension   . Melanoma ([place]) 1960    left shoulder skin  . Dementia   . Peripheral neuropathy ([place])   . Glaucoma   . Atrial fibrillation ([place])   . Bradycardia   . Umbilical hernia   . Lower back pain   . Muscle pain    Past Surgical History  Procedure  Laterality Date  . Skin cancer excision Left 1960's    melanoma shoulder    Allergies  Allergen Reactions  . Codeine Other (See Comments)    Per [facility]      Medication List       This list is accurate as of: 07/04/15 10:33 AM.  Always use your most recent med list.               acetaminophen 325 MG tablet  Commonly known as:  TYLENOL  Take 650 mg by mouth every 4 (four) hours as needed for moderate pain.     aspirin 81 MG tablet  Take 81 mg by mouth daily.     CERTAVITE/ANTIOXIDANTS PO  Take 1 tablet by mouth daily.     CITRUCEL PO  Take 1 tablet by mouth daily.     ipratropium-albuterol 0.5-2.5 (3) MG/3ML Soln  Commonly known as:  DUONEB  Take 3 mLs by nebulization 2 (two) times daily as needed (for Shortness of breath).     lisinopril 40 MG tablet  Commonly known as:  PRINIVIL,ZESTRIL  Take 40 mg by mouth daily. For HTN     metoprolol succinate 100 MG 24 hr tablet  Commonly known as:  TOPROL-XL  Take 100 mg by mouth daily. Take with or immediately following a meal.     NAMZARIC 28-10 MG Cp24  Generic drug:  Memantine HCl-Donepezil HCl  Take 1 capsule by mouth at bedtime.  sennosides-docusate sodium 8.6-50 MG tablet  Commonly known as:  SENOKOT-S  Take 1 tablet by mouth 2 (two) times daily. Hold for loose stool        Review of Systems  Constitutional: Positive for fatigue.  HENT: Negative.   Eyes: Positive for visual disturbance (corrective lenses).  Respiratory: Negative.   Cardiovascular: Negative for chest pain, palpitations and leg swelling.  Gastrointestinal: Negative.   Endocrine: Positive for polyuria.       Diabetic with neuropathy  Genitourinary: Positive for frequency. Negative for dysuria and flank pain.       Episodes of incontinence  Musculoskeletal: Positive for gait problem.  Skin: Negative.   Allergic/Immunologic: Negative.   Neurological: Negative for tremors, seizures, syncope, facial asymmetry, speech difficulty, weakness,  light-headedness, numbness and headaches.       Dementia  Psychiatric/Behavioral: Positive for confusion and sleep disturbance.    Immunization History  Administered Date(s) Administered  . Influenza-Unspecified 11/10/2012, 10/27/2013, 10/28/2014  . PPD Test 11/17/2011, 11/16/2014  . Pneumococcal Polysaccharide-23 11/23/1999  . Td 10/22/2000   Pertinent  Health Maintenance Due  Topic Date Due  . OPHTHALMOLOGY EXAM  09/24/1930  . DEXA SCAN  09/23/1985  . PNA vac Low Risk Adult (2 of 2 - PCV13) 11/22/2000  . HEMOGLOBIN A1C  04/28/2015  . INFLUENZA VACCINE  08/23/2015  . FOOT EXAM  10/26/2015   Fall Risk  11/29/2014 11/09/2014 10/26/2014 10/27/2013 10/27/2013  Falls in the past year? No No No No No  Risk for fall due to : - - - Impaired mobility;Impaired balance/gait;Mental status change -     Filed Vitals:   07/04/15 1024  BP: 130/75  Pulse: 77  Temp: 97.6 F (36.4 C)  Resp: 20  Height: 4\' 11"  (1.499 m)  Weight: 122 lb (55.339 kg)  SpO2: 95%   Body mass index is 24.63 kg/(m^2). Physical Exam  Constitutional: She appears well-developed and well-nourished.  HENT:  Head: Normocephalic and atraumatic.  Right Ear: External ear normal.  Left Ear: External ear normal.  Eyes: Conjunctivae and EOM are normal. Pupils are equal, round, and reactive to light.  Neck: Neck supple. No JVD present. No tracheal deviation present. No thyromegaly present.  Cardiovascular: Normal rate.   Murmur heard.  Systolic murmur is present with a grade of 2/6  Pulmonary/Chest: Effort normal and breath sounds normal. No respiratory distress. She has no rales.  Abdominal: Soft. Bowel sounds are normal. She exhibits no mass. There is no tenderness.  Musculoskeletal: She exhibits no edema or tenderness.  Unstable gait. Using rolling walker. Mild scoliosis of thoracic and lumbar spine.  Lymphadenopathy:    She has no cervical adenopathy.  Neurological: She is alert. She has normal reflexes. No cranial  nerve deficit. She exhibits normal muscle tone. Coordination normal.  Demented  Skin: Skin is warm and dry. No rash noted. No erythema.  Psychiatric: She has a normal mood and affect. Her behavior is normal. Thought content normal. Her speech is not delayed and not slurred. She is not agitated, not aggressive, not hyperactive, not slowed, not withdrawn, not actively hallucinating and not combative. Thought content is not paranoid and not delusional. Cognition and memory are impaired. She exhibits abnormal recent memory.    Labs reviewed:  Recent Labs  10/28/14 05/05/15  NA 138 143  K 4.0 4.4  BUN 10 14  CREATININE 0.7 0.7    Recent Labs  10/28/14 05/05/15  AST 11* 15  ALT 9 13  ALKPHOS 52 52  Recent Labs  05/05/15  WBC 5.0  HGB 12.8  HCT 39  PLT 132*   Lab Results  Component Value Date   TSH 0.88 05/05/2015   Lab Results  Component Value Date   HGBA1C 6.2* 10/28/2014   Lab Results  Component Value Date   CHOL 191 05/20/2012   HDL 38* 05/20/2012   LDLCALC 118* 05/20/2012   TRIG 174* 05/20/2012   CHOLHDL 5.0 05/20/2012    Assessment/Plan 1. Dementia, without behavioral disturbance Continue Namzaric 28/10  2. Essential hypertension Continue lisinopril  3. Type 2 diabetes, controlled, with neuropathy ([place]) Currently off all medication. Glucose of 91 on 05/05/2015  4. Abnormal TSH TSH 0.88 on 05/05/2015. Patient is not supplemented.

## 2015-08-03 ENCOUNTER — Telehealth: Payer: Self-pay

## 2015-08-03 NOTE — Telephone Encounter (Signed)
Form received to complete Formulary or Tier Exception for Namzaric. Per Dr.Green shred form for patient was switched to another therapy ( both medications separately).

## 2015-08-21 ENCOUNTER — Encounter (HOSPITAL_COMMUNITY)
Admission: EM | Disposition: A | Discharge status: Skilled Nursing Facility | Payer: Self-pay | Source: Home / Self Care | Attending: Family Medicine

## 2015-08-21 ENCOUNTER — Encounter (HOSPITAL_COMMUNITY): Payer: Self-pay | Admitting: Emergency Medicine

## 2015-08-21 ENCOUNTER — Inpatient Hospital Stay (HOSPITAL_COMMUNITY)
Admission: EM | Admit: 2015-08-21 | Discharge: 2015-08-24 | DRG: 480 | Disposition: A | Discharge status: Skilled Nursing Facility | Payer: Medicare Other | Attending: Internal Medicine | Admitting: Internal Medicine

## 2015-08-21 ENCOUNTER — Emergency Department (HOSPITAL_COMMUNITY): Payer: Medicare Other

## 2015-08-21 ENCOUNTER — Encounter (HOSPITAL_COMMUNITY): Payer: Self-pay | Admitting: Certified Registered Nurse Anesthetist

## 2015-08-21 DIAGNOSIS — D62 Acute posthemorrhagic anemia: Secondary | ICD-10-CM | POA: Diagnosis not present

## 2015-08-21 DIAGNOSIS — E440 Moderate protein-calorie malnutrition: Secondary | ICD-10-CM | POA: Diagnosis present

## 2015-08-21 DIAGNOSIS — E1140 Type 2 diabetes mellitus with diabetic neuropathy, unspecified: Secondary | ICD-10-CM

## 2015-08-21 DIAGNOSIS — S72142A Displaced intertrochanteric fracture of left femur, initial encounter for closed fracture: Secondary | ICD-10-CM | POA: Diagnosis present

## 2015-08-21 DIAGNOSIS — Z7982 Long term (current) use of aspirin: Secondary | ICD-10-CM

## 2015-08-21 DIAGNOSIS — F0390 Unspecified dementia without behavioral disturbance: Secondary | ICD-10-CM | POA: Diagnosis present

## 2015-08-21 DIAGNOSIS — M25552 Pain in left hip: Secondary | ICD-10-CM | POA: Diagnosis present

## 2015-08-21 DIAGNOSIS — Z85820 Personal history of malignant melanoma of skin: Secondary | ICD-10-CM

## 2015-08-21 DIAGNOSIS — W07XXXA Fall from chair, initial encounter: Secondary | ICD-10-CM | POA: Diagnosis present

## 2015-08-21 DIAGNOSIS — E1142 Type 2 diabetes mellitus with diabetic polyneuropathy: Secondary | ICD-10-CM | POA: Diagnosis present

## 2015-08-21 DIAGNOSIS — Z885 Allergy status to narcotic agent status: Secondary | ICD-10-CM | POA: Diagnosis not present

## 2015-08-21 DIAGNOSIS — Z79899 Other long term (current) drug therapy: Secondary | ICD-10-CM

## 2015-08-21 DIAGNOSIS — H9190 Unspecified hearing loss, unspecified ear: Secondary | ICD-10-CM | POA: Diagnosis present

## 2015-08-21 DIAGNOSIS — Z01810 Encounter for preprocedural cardiovascular examination: Secondary | ICD-10-CM | POA: Diagnosis not present

## 2015-08-21 DIAGNOSIS — S0083XS Contusion of other part of head, sequela: Secondary | ICD-10-CM | POA: Diagnosis not present

## 2015-08-21 DIAGNOSIS — Z8249 Family history of ischemic heart disease and other diseases of the circulatory system: Secondary | ICD-10-CM

## 2015-08-21 DIAGNOSIS — I4891 Unspecified atrial fibrillation: Secondary | ICD-10-CM | POA: Diagnosis present

## 2015-08-21 DIAGNOSIS — D72829 Elevated white blood cell count, unspecified: Secondary | ICD-10-CM | POA: Diagnosis not present

## 2015-08-21 DIAGNOSIS — T148 Other injury of unspecified body region: Secondary | ICD-10-CM | POA: Diagnosis not present

## 2015-08-21 DIAGNOSIS — I214 Non-ST elevation (NSTEMI) myocardial infarction: Secondary | ICD-10-CM | POA: Diagnosis present

## 2015-08-21 DIAGNOSIS — N318 Other neuromuscular dysfunction of bladder: Secondary | ICD-10-CM | POA: Diagnosis present

## 2015-08-21 DIAGNOSIS — E876 Hypokalemia: Secondary | ICD-10-CM | POA: Diagnosis present

## 2015-08-21 DIAGNOSIS — Z66 Do not resuscitate: Secondary | ICD-10-CM | POA: Diagnosis present

## 2015-08-21 DIAGNOSIS — S0012XA Contusion of left eyelid and periocular area, initial encounter: Secondary | ICD-10-CM | POA: Diagnosis present

## 2015-08-21 DIAGNOSIS — I10 Essential (primary) hypertension: Secondary | ICD-10-CM | POA: Diagnosis present

## 2015-08-21 DIAGNOSIS — S0083XA Contusion of other part of head, initial encounter: Secondary | ICD-10-CM | POA: Diagnosis not present

## 2015-08-21 DIAGNOSIS — S0083XD Contusion of other part of head, subsequent encounter: Secondary | ICD-10-CM | POA: Diagnosis not present

## 2015-08-21 DIAGNOSIS — S72002S Fracture of unspecified part of neck of left femur, sequela: Secondary | ICD-10-CM | POA: Diagnosis not present

## 2015-08-21 DIAGNOSIS — H409 Unspecified glaucoma: Secondary | ICD-10-CM | POA: Diagnosis present

## 2015-08-21 DIAGNOSIS — S72002A Fracture of unspecified part of neck of left femur, initial encounter for closed fracture: Secondary | ICD-10-CM | POA: Diagnosis not present

## 2015-08-21 DIAGNOSIS — E785 Hyperlipidemia, unspecified: Secondary | ICD-10-CM | POA: Diagnosis present

## 2015-08-21 DIAGNOSIS — Z419 Encounter for procedure for purposes other than remedying health state, unspecified: Secondary | ICD-10-CM

## 2015-08-21 DIAGNOSIS — Z09 Encounter for follow-up examination after completed treatment for conditions other than malignant neoplasm: Secondary | ICD-10-CM

## 2015-08-21 DIAGNOSIS — F329 Major depressive disorder, single episode, unspecified: Secondary | ICD-10-CM | POA: Diagnosis present

## 2015-08-21 DIAGNOSIS — I672 Cerebral atherosclerosis: Secondary | ICD-10-CM | POA: Diagnosis present

## 2015-08-21 DIAGNOSIS — Z87820 Personal history of traumatic brain injury: Secondary | ICD-10-CM | POA: Diagnosis not present

## 2015-08-21 DIAGNOSIS — T148XXA Other injury of unspecified body region, initial encounter: Secondary | ICD-10-CM

## 2015-08-21 DIAGNOSIS — R7989 Other specified abnormal findings of blood chemistry: Secondary | ICD-10-CM

## 2015-08-21 DIAGNOSIS — S72002D Fracture of unspecified part of neck of left femur, subsequent encounter for closed fracture with routine healing: Secondary | ICD-10-CM | POA: Diagnosis not present

## 2015-08-21 DIAGNOSIS — R9431 Abnormal electrocardiogram [ECG] [EKG]: Secondary | ICD-10-CM

## 2015-08-21 HISTORY — DX: Depression, unspecified: F32.A

## 2015-08-21 HISTORY — DX: Unspecified fall, initial encounter: W19.XXXA

## 2015-08-21 HISTORY — DX: Unspecified place in nursing home as the place of occurrence of the external cause: Y92.129

## 2015-08-21 HISTORY — DX: Unspecified hearing loss, unspecified ear: H91.90

## 2015-08-21 HISTORY — DX: Presence of spectacles and contact lenses: Z97.3

## 2015-08-21 HISTORY — DX: Unspecified intracranial injury with loss of consciousness of unspecified duration, initial encounter: S06.9X9A

## 2015-08-21 HISTORY — DX: Major depressive disorder, single episode, unspecified: F32.9

## 2015-08-21 HISTORY — DX: Unspecified intracranial injury with loss of consciousness status unknown, initial encounter: S06.9XAA

## 2015-08-21 LAB — CBC WITH DIFFERENTIAL/PLATELET
Basophils Absolute: 0 10*3/uL (ref 0.0–0.1)
Basophils Relative: 0 %
EOS ABS: 0 10*3/uL (ref 0.0–0.7)
EOS PCT: 0 %
HCT: 43 % (ref 36.0–46.0)
HEMOGLOBIN: 13.9 g/dL (ref 12.0–15.0)
LYMPHS ABS: 0.7 10*3/uL (ref 0.7–4.0)
LYMPHS PCT: 6 %
MCH: 27.3 pg (ref 26.0–34.0)
MCHC: 32.3 g/dL (ref 30.0–36.0)
MCV: 84.5 fL (ref 78.0–100.0)
MONOS PCT: 5 %
Monocytes Absolute: 0.6 10*3/uL (ref 0.1–1.0)
NEUTROS PCT: 89 %
Neutro Abs: 10.6 10*3/uL — ABNORMAL HIGH (ref 1.7–7.7)
Platelets: 177 10*3/uL (ref 150–400)
RBC: 5.09 MIL/uL (ref 3.87–5.11)
RDW: 14.4 % (ref 11.5–15.5)
WBC: 12 10*3/uL — AB (ref 4.0–10.5)

## 2015-08-21 LAB — COMPREHENSIVE METABOLIC PANEL
ALT: 17 U/L (ref 14–54)
AST: 20 U/L (ref 15–41)
Albumin: 4.3 g/dL (ref 3.5–5.0)
Alkaline Phosphatase: 66 U/L (ref 38–126)
Anion gap: 10 (ref 5–15)
BUN: 11 mg/dL (ref 6–20)
CO2: 26 mmol/L (ref 22–32)
Calcium: 9.4 mg/dL (ref 8.9–10.3)
Chloride: 105 mmol/L (ref 101–111)
Creatinine, Ser: 0.6 mg/dL (ref 0.44–1.00)
GFR calc Af Amer: >60 mL/min (ref >60)
GFR calc non Af Amer: >60 mL/min (ref >60)
Glucose, Bld: 137 mg/dL — ABNORMAL HIGH (ref 65–99)
Potassium: 3.1 mmol/L — ABNORMAL LOW (ref 3.5–5.1)
Sodium: 141 mmol/L (ref 135–145)
Total Bilirubin: 0.7 mg/dL (ref 0.3–1.2)
Total Protein: 7.2 g/dL (ref 6.5–8.1)

## 2015-08-21 LAB — TROPONIN I
TROPONIN I: 0.13 ng/mL — AB (ref <0.03)
Troponin I: 0.16 ng/mL (ref <0.03)

## 2015-08-21 LAB — TYPE AND SCREEN
ABO/RH(D): B POS
ANTIBODY SCREEN: NEGATIVE

## 2015-08-21 LAB — ABO/RH: ABO/RH(D): B POS

## 2015-08-21 LAB — SURGICAL PCR SCREEN
MRSA, PCR: NEGATIVE
Staphylococcus aureus: NEGATIVE

## 2015-08-21 SURGERY — FIXATION, FRACTURE, INTERTROCHANTERIC, WITH INTRAMEDULLARY ROD
Anesthesia: Choice | Laterality: Left

## 2015-08-21 MED ORDER — SODIUM CHLORIDE 0.45 % IV SOLN
INTRAVENOUS | Status: DC
  Administered 2015-08-21: 18:00:00 via INTRAVENOUS
  Administered 2015-08-22: 100 mL/h via INTRAVENOUS
  Administered 2015-08-22: 05:00:00 via INTRAVENOUS

## 2015-08-21 MED ORDER — BOOST / RESOURCE BREEZE PO LIQD
1.0000 | Freq: Two times a day (BID) | ORAL | Status: DC
  Administered 2015-08-22 – 2015-08-23 (×3): 1 via ORAL

## 2015-08-21 MED ORDER — SENNOSIDES-DOCUSATE SODIUM 8.6-50 MG PO TABS
1.0000 | ORAL_TABLET | Freq: Every day | ORAL | Status: DC
Start: 2015-08-21 — End: 2015-08-24
  Administered 2015-08-22 – 2015-08-24 (×3): 1 via ORAL
  Filled 2015-08-21 (×3): qty 1

## 2015-08-21 MED ORDER — METOPROLOL SUCCINATE ER 100 MG PO TB24
100.0000 mg | ORAL_TABLET | Freq: Every day | ORAL | Status: DC
  Administered 2015-08-22 – 2015-08-24 (×3): 100 mg via ORAL
  Filled 2015-08-21: qty 1
  Filled 2015-08-21: qty 2
  Filled 2015-08-21: qty 1

## 2015-08-21 MED ORDER — FENTANYL CITRATE (PF) 100 MCG/2ML IJ SOLN
25.0000 ug | Freq: Once | INTRAMUSCULAR | Status: AC
  Administered 2015-08-21: 25 ug via INTRAVENOUS
  Filled 2015-08-21: qty 2

## 2015-08-21 MED ORDER — LISINOPRIL 40 MG PO TABS
40.0000 mg | ORAL_TABLET | Freq: Every day | ORAL | Status: DC
  Administered 2015-08-22 – 2015-08-24 (×3): 40 mg via ORAL
  Filled 2015-08-21 (×2): qty 1
  Filled 2015-08-21: qty 2
  Filled 2015-08-21: qty 1

## 2015-08-21 MED ORDER — ASPIRIN EC 81 MG PO TBEC
81.0000 mg | DELAYED_RELEASE_TABLET | Freq: Every day | ORAL | Status: DC
  Administered 2015-08-22: 81 mg via ORAL
  Filled 2015-08-21: qty 1

## 2015-08-21 MED ORDER — POTASSIUM CHLORIDE 10 MEQ/100ML IV SOLN
10.0000 meq | INTRAVENOUS | Status: AC
  Administered 2015-08-22 (×4): 10 meq via INTRAVENOUS
  Filled 2015-08-21 (×2): qty 100

## 2015-08-21 MED ORDER — MEMANTINE HCL 10 MG PO TABS
10.0000 mg | ORAL_TABLET | Freq: Two times a day (BID) | ORAL | Status: DC
  Administered 2015-08-21 – 2015-08-24 (×5): 10 mg via ORAL
  Filled 2015-08-21 (×7): qty 1

## 2015-08-21 MED ORDER — ENOXAPARIN SODIUM 40 MG/0.4ML ~~LOC~~ SOLN: 40.0000 mg | SUBCUTANEOUS | Status: DC

## 2015-08-21 MED ORDER — DONEPEZIL HCL 10 MG PO TABS
10.0000 mg | ORAL_TABLET | Freq: Every day | ORAL | Status: DC
  Administered 2015-08-21 – 2015-08-23 (×2): 10 mg via ORAL
  Filled 2015-08-21 (×2): qty 1

## 2015-08-21 MED ORDER — CHLORHEXIDINE GLUCONATE 4 % EX LIQD
60.0000 mL | Freq: Once | CUTANEOUS | Status: AC
  Administered 2015-08-21: 4 via TOPICAL
  Filled 2015-08-21: qty 15

## 2015-08-21 MED ORDER — SODIUM CHLORIDE 0.9 % IV SOLN
Freq: Once | INTRAVENOUS | Status: AC
  Administered 2015-08-21: 13:00:00 via INTRAVENOUS

## 2015-08-21 MED ORDER — POVIDONE-IODINE 10 % EX SWAB: 2.0000 "application " | Freq: Once | CUTANEOUS | Status: DC

## 2015-08-21 MED ORDER — CEFAZOLIN SODIUM-DEXTROSE 2-4 GM/100ML-% IV SOLN: 2.0000 g | INTRAVENOUS | Status: DC

## 2015-08-21 MED ORDER — SODIUM CHLORIDE 0.9 % IV SOLN
INTRAVENOUS | Status: AC
  Administered 2015-08-21: 17:00:00 via INTRAVENOUS

## 2015-08-21 NOTE — Anesthesia Preprocedure Evaluation (Deleted)
Anesthesia Evaluation    Airway        Dental   Pulmonary COPD,  COPD inhaler,           Cardiovascular hypertension, Pt. on medications and Pt. on home beta blockers + dysrhythmias Atrial Fibrillation   '14 ECHO: EF 60-65%, valves OK   Neuro/Psych Anxiety Depression ariceptH/o TBI/ Subdural hematoma    GI/Hepatic   Endo/Other  diabetes (diet controlled, glu 137)  Renal/GU      Musculoskeletal  (+) Arthritis , Osteoarthritis,    Abdominal   Peds  Hematology   Anesthesia Other Findings   Reproductive/Obstetrics                             Anesthesia Physical Anesthesia Plan:  EKG changes and troponin elevated< Dr. Lyla Glassing to postpone, pending cardiology consultation. Anesthesia Quick Evaluation

## 2015-08-21 NOTE — ED Notes (Signed)
MD at bedside. 

## 2015-08-21 NOTE — ED Notes (Signed)
Unable to get blue top for labs

## 2015-08-21 NOTE — Consult Note (Signed)
ORTHOPAEDIC CONSULTATION  REQUESTING PHYSICIAN: Mariel Aloe, MD  PCP:  Jeanmarie Hubert, MD  Chief Complaint: Left intertrochanteric femur fracture  HPI: Teresa Richards is a 82 y.o. female with a history of diabetes, atrial fibrillation, and dementia who got up to go to the bathroom by herself and she was found down on the floor. She lives at [place] in the skilled nursing facility. She normally uses a walker. She had hip pain and inability to weight-bear. She was brought to the emergency department at [facility] where x-rays revealed a displaced left intertrochanteric femur fracture. She is also found to have left eye hematoma, and CT was negative for fracture or intracranial bleed. Orthopedic consultation was placed for management of left hip fracture.  Past Medical History:  Diagnosis Date  . Atrial fibrillation ([place])   . Bradycardia   . Dementia   . Depression   . Diabetes mellitus without complication ([place])   . Fall at nursing home   . Glaucoma   . HOH (hard of hearing)   . Hypertension   . Lower back pain   . Melanoma ([place]) 1960   left shoulder skin  . Muscle pain   . Peripheral neuropathy ([place])   . TBI (traumatic brain injury) ([place])   . Umbilical hernia   . Wears glasses    Past Surgical History:  Procedure Laterality Date  . SKIN CANCER EXCISION Left 1960's   melanoma shoulder   Social History   Social History  . Marital status: Married    Spouse name: N/A  . Number of children: 1  . Years of education: 1   Occupational History  . retired Network engineer    Social History Main Topics  . Smoking status: Never Smoker  . Smokeless tobacco: Never Used  . Alcohol use No     Comment: Patient drinks coffee occasional.  . Drug use: No  . Sexual activity: No   Other Topics Concern  . None   Social History Narrative   Lives at [facility], moved to skill 11/16/14   Widowed    Has MOST form signed   DNR, POA, Living Will   Walks with walker   Exercise:  once a day does legs and arms    Never smoked   Alcohol none    Family History  Problem Relation Age of Onset  . Leukemia Father   . Heart disease Brother   . Breast cancer Sister    Allergies  Allergen Reactions  . Codeine Other (See Comments)    Per MAR   Prior to Admission medications   Medication Sig Start Date End Date Taking? Authorizing Provider  acetaminophen (TYLENOL) 325 MG tablet Take 650 mg by mouth every 4 (four) hours as needed for moderate pain.   Yes Historical Provider, MD  aspirin 81 MG tablet Take 81 mg by mouth daily.   Yes Historical Provider, MD  Calcium Polycarbophil (FIBER-CAPS PO) Take 1 capsule by mouth daily.   Yes Historical Provider, MD  donepezil (ARICEPT) 10 MG tablet Take 10 mg by mouth at bedtime.   Yes Historical Provider, MD  feeding supplement (BOOST / RESOURCE BREEZE) LIQD Take 1 Container by mouth 2 (two) times daily between meals.   Yes Historical Provider, MD  ipratropium-albuterol (DUONEB) 0.5-2.5 (3) MG/3ML SOLN Take 3 mLs by nebulization 2 (two) times daily as needed (for Shortness of breath).   Yes Historical Provider, MD  lisinopril (PRINIVIL,ZESTRIL) 40 MG tablet Take 40 mg  by mouth daily. For HTN   Yes Historical Provider, MD  memantine (NAMENDA) 10 MG tablet Take 10 mg by mouth 2 (two) times daily.   Yes Historical Provider, MD  metoprolol succinate (TOPROL-XL) 100 MG 24 hr tablet Take 100 mg by mouth daily. Take with or immediately following a meal.   Yes Historical Provider, MD  Multiple Vitamins-Minerals (CERTAVITE/ANTIOXIDANTS PO) Take 1 tablet by mouth daily.   Yes Historical Provider, MD  sennosides-docusate sodium (SENOKOT-S) 8.6-50 MG tablet Take 1 tablet by mouth daily. Hold for loose stool    Yes Historical Provider, MD   Ct Head Wo Contrast  Result Date: 08/21/2015 CLINICAL DATA:  Tripped and fell and chair today. Left supraorbital hematoma. Personal history of head trauma. No loss of consciousness. The patient was on the floor  for 30 minutes prior to receiving help. EXAM: CT HEAD WITHOUT CONTRAST CT MAXILLOFACIAL WITHOUT CONTRAST CT CERVICAL SPINE WITHOUT CONTRAST TECHNIQUE: Multidetector CT imaging of the head, cervical spine, and maxillofacial structures were performed using the standard protocol without intravenous contrast. Multiplanar CT image reconstructions of the cervical spine and maxillofacial structures were also generated. COMPARISON:  CT head and cervical spine 01/02/2013. FINDINGS: CT HEAD FINDINGS Moderate generalized atrophy and diffuse white matter disease is present. No acute infarct, hemorrhage, or mass lesion is present. The ventricles are proportionate to the degree of atrophy. No significant extra-axial fluid collection is present. Extensive atherosclerotic calcifications are present throughout the cavernous internal carotid arteries vertebrobasilar system. The paranasal sinuses and mastoid air cells are clear. A left supraorbital scalp hematoma measures 2.7 x 1.6 x 1.7 cm without an underlying fracture. CT MAXILLOFACIAL FINDINGS The left supraorbital scalp hematoma is again seen. There is no underlying fracture. The paranasal sinuses and mastoid air cells are clear. Bilateral lens replacements are present. The globes and orbits are otherwise intact. There is no significant postseptal inflammation on the left. Minimal infraorbital edema is present on the left. The mandible is intact and located. Torus mandibularis are noted bilaterally. Soft tissues of the face and muscles of mastication are otherwise within normal limits. Salivary glands are unremarkable. CT CERVICAL SPINE FINDINGS The cervical spine is imaged from the skull base through T2-3. Chronic loss of disc height is present at each level from C4-5 through C7-T1. There slight degenerative anterolisthesis at C4-5. Chronic endplate changes and uncovertebral spurring is present at these levels in particular. No acute fracture traumatic subluxation is present.  Osseous foraminal narrowing is most pronounced at C5-6 and C6-7 bilaterally. There is heterogeneous enlargement of the thyroid bilaterally. No dominant nodule is present. The lung apices are clear. IMPRESSION: 1. Moderate atrophy and white matter disease without acute intracranial abnormality. 2. 2.7 x 1.6 x 1.7 cm left supraorbital scalp hematoma without underlying fracture or globe injury. 3. Minimal infraorbital soft tissue swelling without other significant facial trauma. 4. Moderate multilevel spondylosis of the cervical spine without evidence for acute fracture or traumatic subluxation. Electronically Signed   By: San Morelle M.D.   On: 08/21/2015 11:58  Ct Cervical Spine Wo Contrast  Result Date: 08/21/2015 CLINICAL DATA:  Tripped and fell and chair today. Left supraorbital hematoma. Personal history of head trauma. No loss of consciousness. The patient was on the floor for 30 minutes prior to receiving help. EXAM: CT HEAD WITHOUT CONTRAST CT MAXILLOFACIAL WITHOUT CONTRAST CT CERVICAL SPINE WITHOUT CONTRAST TECHNIQUE: Multidetector CT imaging of the head, cervical spine, and maxillofacial structures were performed using the standard protocol without intravenous contrast. Multiplanar CT image  reconstructions of the cervical spine and maxillofacial structures were also generated. COMPARISON:  CT head and cervical spine 01/02/2013. FINDINGS: CT HEAD FINDINGS Moderate generalized atrophy and diffuse white matter disease is present. No acute infarct, hemorrhage, or mass lesion is present. The ventricles are proportionate to the degree of atrophy. No significant extra-axial fluid collection is present. Extensive atherosclerotic calcifications are present throughout the cavernous internal carotid arteries vertebrobasilar system. The paranasal sinuses and mastoid air cells are clear. A left supraorbital scalp hematoma measures 2.7 x 1.6 x 1.7 cm without an underlying fracture. CT MAXILLOFACIAL FINDINGS  The left supraorbital scalp hematoma is again seen. There is no underlying fracture. The paranasal sinuses and mastoid air cells are clear. Bilateral lens replacements are present. The globes and orbits are otherwise intact. There is no significant postseptal inflammation on the left. Minimal infraorbital edema is present on the left. The mandible is intact and located. Torus mandibularis are noted bilaterally. Soft tissues of the face and muscles of mastication are otherwise within normal limits. Salivary glands are unremarkable. CT CERVICAL SPINE FINDINGS The cervical spine is imaged from the skull base through T2-3. Chronic loss of disc height is present at each level from C4-5 through C7-T1. There slight degenerative anterolisthesis at C4-5. Chronic endplate changes and uncovertebral spurring is present at these levels in particular. No acute fracture traumatic subluxation is present. Osseous foraminal narrowing is most pronounced at C5-6 and C6-7 bilaterally. There is heterogeneous enlargement of the thyroid bilaterally. No dominant nodule is present. The lung apices are clear. IMPRESSION: 1. Moderate atrophy and white matter disease without acute intracranial abnormality. 2. 2.7 x 1.6 x 1.7 cm left supraorbital scalp hematoma without underlying fracture or globe injury. 3. Minimal infraorbital soft tissue swelling without other significant facial trauma. 4. Moderate multilevel spondylosis of the cervical spine without evidence for acute fracture or traumatic subluxation. Electronically Signed   By: San Morelle M.D.   On: 08/21/2015 11:58  Dg Chest Portable 1 View  Result Date: 08/21/2015 CLINICAL DATA:  Unwitnessed fall from chair today.  Hip fracture. EXAM: PORTABLE CHEST 1 VIEW COMPARISON:  Two-view chest x-ray 01/02/2013 FINDINGS: The heart size is normal. Lung volumes remain low. Atherosclerotic changes are noted at the aortic arch. Chronic interstitial changes are stable. No acute fractures  are present.  There is no pneumothorax. IMPRESSION: 1. No acute cardiopulmonary disease or significant interval change. 2. Stable chronic interstitial coarsening. 3. Atherosclerosis of the thoracic aorta. Electronically Signed   By: San Morelle M.D.   On: 08/21/2015 12:17  Dg Hip Unilat W Or Wo Pelvis 2-3 Views Left  Result Date: 08/21/2015 CLINICAL DATA:  Fall at home.  Left hip pain. EXAM: DG HIP (WITH OR WITHOUT PELVIS) 2-3V LEFT COMPARISON:  None. FINDINGS: There is a left femoral intertrochanteric fracture with mild displacement. Slight varus angulation. No subluxation or dislocation. Mild degenerative changes in the hips bilaterally. IMPRESSION: Left femoral intertrochanteric fracture with mild displacement and varus angulation. Electronically Signed   By: Rolm Baptise M.D.   On: 08/21/2015 11:29  Dg Femur Min 2 Views Left  Result Date: 08/21/2015 CLINICAL DATA:  Unwitnessed fall. Patient complains of left hip pain. EXAM: LEFT FEMUR 2 VIEWS COMPARISON:  Hip radiograph 08/21/2015 FINDINGS: Re- demonstrated is left femoral intertrochanteric oblique fracture with mild displacement and varus angulation of the distal fracture fragment. There is generalized osteopenia. No distal femoral fractures are seen. IMPRESSION: Known left femoral intertrochanteric fracture with mild displacement and varus angulation. Osteopenia. Electronically Signed  By: Fidela Salisbury M.D.   On: 08/21/2015 13:11  Ct Maxillofacial Wo Contrast  Result Date: 08/21/2015 CLINICAL DATA:  Tripped and fell and chair today. Left supraorbital hematoma. Personal history of head trauma. No loss of consciousness. The patient was on the floor for 30 minutes prior to receiving help. EXAM: CT HEAD WITHOUT CONTRAST CT MAXILLOFACIAL WITHOUT CONTRAST CT CERVICAL SPINE WITHOUT CONTRAST TECHNIQUE: Multidetector CT imaging of the head, cervical spine, and maxillofacial structures were performed using the standard protocol without  intravenous contrast. Multiplanar CT image reconstructions of the cervical spine and maxillofacial structures were also generated. COMPARISON:  CT head and cervical spine 01/02/2013. FINDINGS: CT HEAD FINDINGS Moderate generalized atrophy and diffuse white matter disease is present. No acute infarct, hemorrhage, or mass lesion is present. The ventricles are proportionate to the degree of atrophy. No significant extra-axial fluid collection is present. Extensive atherosclerotic calcifications are present throughout the cavernous internal carotid arteries vertebrobasilar system. The paranasal sinuses and mastoid air cells are clear. A left supraorbital scalp hematoma measures 2.7 x 1.6 x 1.7 cm without an underlying fracture. CT MAXILLOFACIAL FINDINGS The left supraorbital scalp hematoma is again seen. There is no underlying fracture. The paranasal sinuses and mastoid air cells are clear. Bilateral lens replacements are present. The globes and orbits are otherwise intact. There is no significant postseptal inflammation on the left. Minimal infraorbital edema is present on the left. The mandible is intact and located. Torus mandibularis are noted bilaterally. Soft tissues of the face and muscles of mastication are otherwise within normal limits. Salivary glands are unremarkable. CT CERVICAL SPINE FINDINGS The cervical spine is imaged from the skull base through T2-3. Chronic loss of disc height is present at each level from C4-5 through C7-T1. There slight degenerative anterolisthesis at C4-5. Chronic endplate changes and uncovertebral spurring is present at these levels in particular. No acute fracture traumatic subluxation is present. Osseous foraminal narrowing is most pronounced at C5-6 and C6-7 bilaterally. There is heterogeneous enlargement of the thyroid bilaterally. No dominant nodule is present. The lung apices are clear. IMPRESSION: 1. Moderate atrophy and white matter disease without acute intracranial  abnormality. 2. 2.7 x 1.6 x 1.7 cm left supraorbital scalp hematoma without underlying fracture or globe injury. 3. Minimal infraorbital soft tissue swelling without other significant facial trauma. 4. Moderate multilevel spondylosis of the cervical spine without evidence for acute fracture or traumatic subluxation. Electronically Signed   By: San Morelle M.D.   On: 08/21/2015 11:58   Positive ROS: All other systems have been reviewed and were otherwise negative with the exception of those mentioned in the HPI and as above.  Physical Exam: General: Alert, no acute distress. She does have left periorbital hematoma. Cardiovascular: No pedal edema Respiratory: No cyanosis, no use of accessory musculature GI: No organomegaly, abdomen is soft and non-tender Skin: No lesions in the area of chief complaint Neurologic: Sensation intact distally Psychiatric: Patient is competent for consent with normal mood and affect Lymphatic: No axillary or cervical lymphadenopathy  MUSCULOSKELETAL:  BUE/RLE: No trauma. No skin wounds or lesions. No tenderness to palpation. Full painless range of motion.  Left lower extremity: Shortened and externally rotated. No skin wounds or lesions. Palpable pulse present. Positive motor function dorsiflexion, plantarflexion, and great toe extension with strength limited by pain. She reports intact sensation.  Assessment: Multiple medical problems including dementia  Displaced left intertrochanteric femur fracture  Plan: I discussed the findings with the patient and her daughter who is the power of  attorney. We discussed intramedullary fixation of her left intertrochanteric femur fracture to allow for pain control and early mobilization. We discussed the risks, benefits, and alternatives. They understand that she unfortunately has about a 30% risk of 90 day mortality. We will plan for surgery later this evening. All questions were solicited and answered.  The  risks, benefits, and alternatives were discussed with the patient. There are risks associated with the surgery including, but not limited to, problems with anesthesia (death), infection, differences in leg length/angulation/rotation, fracture of bones, loosening or failure of implants, malunion, nonunion, hematoma (blood accumulation) which may require surgical drainage, blood clots, pulmonary embolism, nerve injury (foot drop), and blood vessel injury. The patient understands these risks and elects to proceed.     Anah Billard, Horald Pollen, MD Cell [phone]    08/21/2015 5:22 PM

## 2015-08-21 NOTE — Consult Note (Signed)
Cardiology Consult Note  Admit date: 08/21/2015 Name: Teresa Richards 82 y.o.  female DOB:  1918-02-05 MRN:  [id number]  Today's date:  08/21/2015  Referring Physician:    [facility]   Reason for Consultation:   Preoperative evaluation, abnormal troponin, abnormal EKG   IMPRESSIONS: 1.  Abnormal baseline EKG since the previous one in 2014 with mildly elevated troponin occurring in the setting of a fall and hip fracture 2.  Hypertension 3.  Thoracic and cerebral atherosclerosis by previous imaging 4.  Hypertension poorly controlled 5.  Dementia 6.  Diabetes  RECOMMENDATION: The patient has a new abnormal EKG and borderline troponin that could be due to demand ischemia.  She does not currently have any complaints of chest pain.  From a cardiovascular viewpoint however she is at high risk of complications from general anesthesia due to new EKG changes, borderline troponin, age and uncontrolled blood pressure.  She is not a candidate for cardiac intervention due to age and comorbidities.  I would cycle some serial troponins.  If the troponin elevation remains flat then one could contemplate proceeding with surgery however the risk of general anesthesia is high.  I would obtain an echocardiogram to assess LV function.  In the meantime obtain good blood pressure control with medical therapy including beta blockers and repeat EKG in the morning.  HISTORY: This 82 year old female presented earlier with a left hip fracture when she fell out of a chair at the skilled nursing facility where she is staying.  She is currently a DO NOT RESUSCITATE and has a prior history of hypertension and diabetes and hyperlipidemia.  She has moderate dementia.  She is able to ambulate some.  There is no definite history of previous cardiac disease that I can ascertain from the chart.  The patient is not real good with history at this time.  The patient at the present time is awake and able to give me a history  that she fell earlier today at friend's home and is currently at [facility].  She denies chest pain and has no previous cardiac history that she is aware of.  She denies shortness of breath at the present time and has no PND, orthopnea or edema.  The remainder of the review of systems is difficult due to her dementia and getting her to focus.  Reviewing the chart she does have a history of peripheral neuropathy and also has some hypertonicity of the bladder.  She also has a chronic rash and has chronic low back pain.  Initially plans were made for surgical repair but an EKG returned that was abnormal and 1 troponin is slightly elevated.  She was found to have a large hematoma of her left eye and had a negative workup for any kind of intracerebral bleed.  The chest x-ray showed evidence of thoracic atherosclerosis and she also had previous cranial atherosclerosis noted on previous imaging.  She does have baseline diabetes and hypertension.  There is a note in the chart of atrial fibrillation but I cannot find any documentation of this.  Past Medical History:  Diagnosis Date  . Atrial fibrillation ([place])   . Bradycardia   . Dementia   . Depression   . Diabetes mellitus without complication ([place])   . Fall at nursing home   . Glaucoma   . HOH (hard of hearing)   . Hypertension   . Lower back pain   . Melanoma ([place]) 1960   left shoulder skin  . Muscle  pain   . Peripheral neuropathy ([place])   . TBI (traumatic brain injury) ([place])   . Umbilical hernia   . Wears glasses       Past Surgical History:  Procedure Laterality Date  . SKIN CANCER EXCISION Left 1960's   melanoma shoulder     Allergies:  is allergic to codeine.   Medications: Prior to Admission medications   Medication Sig Start Date End Date Taking? Authorizing Provider  acetaminophen (TYLENOL) 325 MG tablet Take 650 mg by mouth every 4 (four) hours as needed for moderate pain.   Yes Historical Provider, MD  aspirin 81 MG tablet  Take 81 mg by mouth daily.   Yes Historical Provider, MD  Calcium Polycarbophil (FIBER-CAPS PO) Take 1 capsule by mouth daily.   Yes Historical Provider, MD  donepezil (ARICEPT) 10 MG tablet Take 10 mg by mouth at bedtime.   Yes Historical Provider, MD  feeding supplement (BOOST / RESOURCE BREEZE) LIQD Take 1 Container by mouth 2 (two) times daily between meals.   Yes Historical Provider, MD  ipratropium-albuterol (DUONEB) 0.5-2.5 (3) MG/3ML SOLN Take 3 mLs by nebulization 2 (two) times daily as needed (for Shortness of breath).   Yes Historical Provider, MD  lisinopril (PRINIVIL,ZESTRIL) 40 MG tablet Take 40 mg by mouth daily. For HTN   Yes Historical Provider, MD  memantine (NAMENDA) 10 MG tablet Take 10 mg by mouth 2 (two) times daily.   Yes Historical Provider, MD  metoprolol succinate (TOPROL-XL) 100 MG 24 hr tablet Take 100 mg by mouth daily. Take with or immediately following a meal.   Yes Historical Provider, MD  Multiple Vitamins-Minerals (CERTAVITE/ANTIOXIDANTS PO) Take 1 tablet by mouth daily.   Yes Historical Provider, MD  sennosides-docusate sodium (SENOKOT-S) 8.6-50 MG tablet Take 1 tablet by mouth daily. Hold for loose stool    Yes Historical Provider, MD    Family History: Family Status  Relation Status  . Father Deceased  . Mother Deceased  . Daughter Alive  . Sister Alive  . Brother Deceased  . Brother Alive  . Brother Alive  . Brother Deceased   Cause of death unknown  . Sister Alive    Social History:   reports that she has never smoked. She has never used smokeless tobacco. She reports that she does not drink alcohol or use drugs.   Social History   Social History Narrative   Lives at [facility], moved to [facility] 11/16/14   Widowed    Has MOST form signed   DNR, POA, Living Will   Walks with walker   Exercise: once a day does legs and arms    Never smoked   Alcohol none     Review of Systems: Not obtainable to a high degree of accuracy.    Physical  Exam: BP (!) 161/82 (BP Location: Right Arm)   Pulse 84   Temp 97.8 F (36.6 C) (Oral)   Resp 18   Ht 5\' 1"  (1.549 m)   Wt 54 kg (119 lb)   SpO2 97%   BMI 22.48 kg/m   General appearance: She is a small thin elderly white female lying in bed currently in no acute distress Head: Normocephalic, without obvious abnormality, There is a large hematoma neck Moses involving the left eye with the eyelid being closed, bandage over the left temple Eyes: conjunctivae/corneas clear. PERRL, EOM's intact. Fundi benign. Neck: no adenopathy, no carotid bruit, no JVD, supple, symmetrical, trachea midline and thyroid not enlarged, symmetric, no  tenderness/mass/nodules Lungs: clear to auscultation bilaterally Heart: Regular rate and rhythm, normal S1 and S2, no S3, 1 to 2/6 systolic murmur at left sternal border and out to the apex. Abdomen: soft, non-tender; bowel sounds normal; no masses,  no organomegaly Pelvic: deferred Extremities: Extremities with no cyanosis or edema, the hip is externally rotated Pulses: 2+ and symmetric Skin: Skin color, texture, turgor normal. No rashes or lesions Neurologic: Grossly normal  Labs: CBC  Recent Labs  08/21/15 1209  WBC 12.0*  RBC 5.09  HGB 13.9  HCT 43.0  PLT 177  MCV 84.5  MCH 27.3  MCHC 32.3  RDW 14.4  LYMPHSABS 0.7  MONOABS 0.6  EOSABS 0.0  BASOSABS 0.0   CMP   Recent Labs  08/21/15 1209  NA 141  K 3.1*  CL 105  CO2 26  GLUCOSE 137*  BUN 11  CREATININE 0.60  CALCIUM 9.4  PROT 7.2  ALBUMIN 4.3  AST 20  ALT 17  ALKPHOS 66  BILITOT 0.7  GFRNONAA >60  GFRAA >60   Cardiac Panel (last 3 results)  Recent Labs  08/21/15 1827  TROPONINI 0.13*    Recent Labs  08/21/15 1827  TROPONINI 0.13*     Radiology:  No acute changes, thoracic atherosclerosis, chronic interstitial changes  EKG: Sinus rhythm, inferolateral ischemia, anterolateral ischemia versus LVH  Signed:  W. Doristine Church MD [facility]   Cardiology  Consultant  08/21/2015, 10:26 PM

## 2015-08-21 NOTE — H&P (Signed)
History and Physical    Teresa Richards [id number] DOB: 12-Jul-1918 DOA: 08/21/2015  PCP: Jeanmarie Hubert, MD Patient coming from: [facility]  Chief Complaint: Fall  HPI: Teresa Richards is a 82 y.o. female with medical history significant of hypertension, diabetes, dementia. Patient presented to the [facility] Emergency department for evaluation after suffering a fall. She reports that she was using her walker to ambulate to the bathroom, when the walker slipped from under her. She fell onto the floor, hitting the left side of her face and her left hip. She reports being down for about one hour. Pain is dull and only exacerbated with movement. She is not able to bear weight on her leg.   ED Course: In the ED, patient was given fentanyl for pain and leg x-ray significant for mildly displaced left femur fracture. She had a slight elevated WBC of 12 thousand and mildly low potassium of 3.1. Orthopedic surgery was consulted and agreed to take her to surgery this afternoon.  Review of Systems: As per HPI otherwise 10 point review of systems negative.   Past Medical History:  Diagnosis Date  . Atrial fibrillation ([place])   . Bradycardia   . Dementia   . Depression   . Diabetes mellitus without complication ([place])   . Fall at nursing home   . Glaucoma   . HOH (hard of hearing)   . Hypertension   . Lower back pain   . Melanoma ([place]) 1960   left shoulder skin  . Muscle pain   . Peripheral neuropathy ([place])   . TBI (traumatic brain injury) ([place])   . Umbilical hernia   . Wears glasses     Past Surgical History:  Procedure Laterality Date  . SKIN CANCER EXCISION Left 1960's   melanoma shoulder     reports that she has never smoked. She has never used smokeless tobacco. She reports that she does not drink alcohol or use drugs.  Allergies  Allergen Reactions  . Codeine Other (See Comments)    Per MAR    Family History  Problem Relation Age of Onset  . Leukemia Father   .  Heart disease Brother   . Breast cancer Sister     Prior to Admission medications   Medication Sig Start Date End Date Taking? Authorizing Provider  acetaminophen (TYLENOL) 325 MG tablet Take 650 mg by mouth every 4 (four) hours as needed for moderate pain.   Yes Historical Provider, MD  aspirin 81 MG tablet Take 81 mg by mouth daily.   Yes Historical Provider, MD  Calcium Polycarbophil (FIBER-CAPS PO) Take 1 capsule by mouth daily.   Yes Historical Provider, MD  donepezil (ARICEPT) 10 MG tablet Take 10 mg by mouth at bedtime.   Yes Historical Provider, MD  feeding supplement (BOOST / RESOURCE BREEZE) LIQD Take 1 Container by mouth 2 (two) times daily between meals.   Yes Historical Provider, MD  ipratropium-albuterol (DUONEB) 0.5-2.5 (3) MG/3ML SOLN Take 3 mLs by nebulization 2 (two) times daily as needed (for Shortness of breath).   Yes Historical Provider, MD  lisinopril (PRINIVIL,ZESTRIL) 40 MG tablet Take 40 mg by mouth daily. For HTN   Yes Historical Provider, MD  memantine (NAMENDA) 10 MG tablet Take 10 mg by mouth 2 (two) times daily.   Yes Historical Provider, MD  metoprolol succinate (TOPROL-XL) 100 MG 24 hr tablet Take 100 mg by mouth daily. Take with or immediately following a meal.   Yes Historical  Provider, MD  Multiple Vitamins-Minerals (CERTAVITE/ANTIOXIDANTS PO) Take 1 tablet by mouth daily.   Yes Historical Provider, MD  sennosides-docusate sodium (SENOKOT-S) 8.6-50 MG tablet Take 1 tablet by mouth daily. Hold for loose stool    Yes Historical Provider, MD    Physical Exam: Vitals:   08/21/15 1300 08/21/15 1400 08/21/15 1400 08/21/15 1500  BP: 155/72 159/92 159/92 169/78  Pulse: 74  79 72  Resp: 18 19 18 15   Temp:   97.9 F (36.6 C)   TempSrc:   Oral   SpO2: 92%  93% 93%  Weight:      Height:          Constitutional: NAD, calm, comfortable Vitals:   08/21/15 1300 08/21/15 1400 08/21/15 1400 08/21/15 1500  BP: 155/72 159/92 159/92 169/78  Pulse: 74  79 72    Resp: 18 19 18 15   Temp:   97.9 F (36.6 C)   TempSrc:   Oral   SpO2: 92%  93% 93%  Weight:      Height:       Eyes: Left eye significantly swollen with extensive ecchymosis and minimal tenderness. Right eye is reactive to light with normal extraocular movements. ENMT: Mucous membranes are moist. Posterior pharynx clear of any exudate or lesions. Poor dentition Neck: normal, supple, no masses, no thyromegaly Respiratory: clear to auscultation bilaterally, no wheezing, no crackles. Normal respiratory effort. No accessory muscle use.  Cardiovascular: Regular rate and rhythm, 2/6 systolic murmur. No rubs / gallops. No extremity edema. 2+ pedal pulses. No carotid bruits.  Abdomen: no tenderness, no masses palpated. No hepatosplenomegaly. Bowel sounds positive.  Musculoskeletal: no clubbing / cyanosis. Left hip with mild tenderness in her groin area. Slight pain with internal rotation. No pain to trochanteric area. No pain along thigh. No contractures. Normal muscle tone.  Skin: no rashes, lesions, ulcers. No induration Neurologic: CN 2-12 grossly intact with mild impairment of VIII. Sensation intact, DTR normal. Strength 5/5 in all 4 with left leg limited by pain.  Psychiatric: Normal judgment and insight. Alert and oriented x 1 (Oriented to person). Normal mood.   Labs on Admission: I have personally reviewed following labs and imaging studies  CBC:  Recent Labs Lab 08/21/15 1209  WBC 12.0*  NEUTROABS 10.6*  HGB 13.9  HCT 43.0  MCV 84.5  PLT 123XX123   Basic Metabolic Panel:  Recent Labs Lab 08/21/15 1209  NA 141  K 3.1*  CL 105  CO2 26  GLUCOSE 137*  BUN 11  CREATININE 0.60  CALCIUM 9.4   GFR: Estimated Creatinine Clearance: 32.4 mL/min (by C-G formula based on SCr of 0.8 mg/dL). Liver Function Tests:  Recent Labs Lab 08/21/15 1209  AST 20  ALT 17  ALKPHOS 66  BILITOT 0.7  PROT 7.2  ALBUMIN 4.3   Urine analysis:    Component Value Date/Time   COLORURINE  YELLOW 05/19/2012 [place] 05/19/2012 1235   LABSPEC 1.013 05/19/2012 1235   PHURINE 7.5 05/19/2012 1235   GLUCOSEU 250 (A) 05/19/2012 1235   HGBUR TRACE (A) 05/19/2012 1235   BILIRUBINUR NEGATIVE 05/19/2012 1235   KETONESUR 15 (A) 05/19/2012 1235   PROTEINUR >300 (A) 05/19/2012 1235   UROBILINOGEN 0.2 05/19/2012 1235   NITRITE NEGATIVE 05/19/2012 1235   LEUKOCYTESUR NEGATIVE 05/19/2012 1235    Radiological Exams on Admission: Ct Head Wo Contrast  Result Date: 08/21/2015 CLINICAL DATA:  Tripped and fell and chair today. Left supraorbital hematoma. Personal history of head trauma. No  loss of consciousness. The patient was on the floor for 30 minutes prior to receiving help. EXAM: CT HEAD WITHOUT CONTRAST CT MAXILLOFACIAL WITHOUT CONTRAST CT CERVICAL SPINE WITHOUT CONTRAST TECHNIQUE: Multidetector CT imaging of the head, cervical spine, and maxillofacial structures were performed using the standard protocol without intravenous contrast. Multiplanar CT image reconstructions of the cervical spine and maxillofacial structures were also generated. COMPARISON:  CT head and cervical spine 01/02/2013. FINDINGS: CT HEAD FINDINGS Moderate generalized atrophy and diffuse white matter disease is present. No acute infarct, hemorrhage, or mass lesion is present. The ventricles are proportionate to the degree of atrophy. No significant extra-axial fluid collection is present. Extensive atherosclerotic calcifications are present throughout the cavernous internal carotid arteries vertebrobasilar system. The paranasal sinuses and mastoid air cells are clear. A left supraorbital scalp hematoma measures 2.7 x 1.6 x 1.7 cm without an underlying fracture. CT MAXILLOFACIAL FINDINGS The left supraorbital scalp hematoma is again seen. There is no underlying fracture. The paranasal sinuses and mastoid air cells are clear. Bilateral lens replacements are present. The globes and orbits are otherwise intact.  There is no significant postseptal inflammation on the left. Minimal infraorbital edema is present on the left. The mandible is intact and located. Torus mandibularis are noted bilaterally. Soft tissues of the face and muscles of mastication are otherwise within normal limits. Salivary glands are unremarkable. CT CERVICAL SPINE FINDINGS The cervical spine is imaged from the skull base through T2-3. Chronic loss of disc height is present at each level from C4-5 through C7-T1. There slight degenerative anterolisthesis at C4-5. Chronic endplate changes and uncovertebral spurring is present at these levels in particular. No acute fracture traumatic subluxation is present. Osseous foraminal narrowing is most pronounced at C5-6 and C6-7 bilaterally. There is heterogeneous enlargement of the thyroid bilaterally. No dominant nodule is present. The lung apices are clear. IMPRESSION: 1. Moderate atrophy and white matter disease without acute intracranial abnormality. 2. 2.7 x 1.6 x 1.7 cm left supraorbital scalp hematoma without underlying fracture or globe injury. 3. Minimal infraorbital soft tissue swelling without other significant facial trauma. 4. Moderate multilevel spondylosis of the cervical spine without evidence for acute fracture or traumatic subluxation. Electronically Signed   By: San Morelle M.D.   On: 08/21/2015 11:58  Ct Cervical Spine Wo Contrast  Result Date: 08/21/2015 CLINICAL DATA:  Tripped and fell and chair today. Left supraorbital hematoma. Personal history of head trauma. No loss of consciousness. The patient was on the floor for 30 minutes prior to receiving help. EXAM: CT HEAD WITHOUT CONTRAST CT MAXILLOFACIAL WITHOUT CONTRAST CT CERVICAL SPINE WITHOUT CONTRAST TECHNIQUE: Multidetector CT imaging of the head, cervical spine, and maxillofacial structures were performed using the standard protocol without intravenous contrast. Multiplanar CT image reconstructions of the cervical spine and  maxillofacial structures were also generated. COMPARISON:  CT head and cervical spine 01/02/2013. FINDINGS: CT HEAD FINDINGS Moderate generalized atrophy and diffuse white matter disease is present. No acute infarct, hemorrhage, or mass lesion is present. The ventricles are proportionate to the degree of atrophy. No significant extra-axial fluid collection is present. Extensive atherosclerotic calcifications are present throughout the cavernous internal carotid arteries vertebrobasilar system. The paranasal sinuses and mastoid air cells are clear. A left supraorbital scalp hematoma measures 2.7 x 1.6 x 1.7 cm without an underlying fracture. CT MAXILLOFACIAL FINDINGS The left supraorbital scalp hematoma is again seen. There is no underlying fracture. The paranasal sinuses and mastoid air cells are clear. Bilateral lens replacements are present. The globes and  orbits are otherwise intact. There is no significant postseptal inflammation on the left. Minimal infraorbital edema is present on the left. The mandible is intact and located. Torus mandibularis are noted bilaterally. Soft tissues of the face and muscles of mastication are otherwise within normal limits. Salivary glands are unremarkable. CT CERVICAL SPINE FINDINGS The cervical spine is imaged from the skull base through T2-3. Chronic loss of disc height is present at each level from C4-5 through C7-T1. There slight degenerative anterolisthesis at C4-5. Chronic endplate changes and uncovertebral spurring is present at these levels in particular. No acute fracture traumatic subluxation is present. Osseous foraminal narrowing is most pronounced at C5-6 and C6-7 bilaterally. There is heterogeneous enlargement of the thyroid bilaterally. No dominant nodule is present. The lung apices are clear. IMPRESSION: 1. Moderate atrophy and white matter disease without acute intracranial abnormality. 2. 2.7 x 1.6 x 1.7 cm left supraorbital scalp hematoma without underlying  fracture or globe injury. 3. Minimal infraorbital soft tissue swelling without other significant facial trauma. 4. Moderate multilevel spondylosis of the cervical spine without evidence for acute fracture or traumatic subluxation. Electronically Signed   By: San Morelle M.D.   On: 08/21/2015 11:58  Dg Chest Portable 1 View  Result Date: 08/21/2015 CLINICAL DATA:  Unwitnessed fall from chair today.  Hip fracture. EXAM: PORTABLE CHEST 1 VIEW COMPARISON:  Two-view chest x-ray 01/02/2013 FINDINGS: The heart size is normal. Lung volumes remain low. Atherosclerotic changes are noted at the aortic arch. Chronic interstitial changes are stable. No acute fractures are present.  There is no pneumothorax. IMPRESSION: 1. No acute cardiopulmonary disease or significant interval change. 2. Stable chronic interstitial coarsening. 3. Atherosclerosis of the thoracic aorta. Electronically Signed   By: San Morelle M.D.   On: 08/21/2015 12:17  Dg Hip Unilat W Or Wo Pelvis 2-3 Views Left  Result Date: 08/21/2015 CLINICAL DATA:  Fall at home.  Left hip pain. EXAM: DG HIP (WITH OR WITHOUT PELVIS) 2-3V LEFT COMPARISON:  None. FINDINGS: There is a left femoral intertrochanteric fracture with mild displacement. Slight varus angulation. No subluxation or dislocation. Mild degenerative changes in the hips bilaterally. IMPRESSION: Left femoral intertrochanteric fracture with mild displacement and varus angulation. Electronically Signed   By: Rolm Baptise M.D.   On: 08/21/2015 11:29  Dg Femur Min 2 Views Left  Result Date: 08/21/2015 CLINICAL DATA:  Unwitnessed fall. Patient complains of left hip pain. EXAM: LEFT FEMUR 2 VIEWS COMPARISON:  Hip radiograph 08/21/2015 FINDINGS: Re- demonstrated is left femoral intertrochanteric oblique fracture with mild displacement and varus angulation of the distal fracture fragment. There is generalized osteopenia. No distal femoral fractures are seen. IMPRESSION: Known left  femoral intertrochanteric fracture with mild displacement and varus angulation. Osteopenia. Electronically Signed   By: Fidela Salisbury M.D.   On: 08/21/2015 13:11  Ct Maxillofacial Wo Contrast  Result Date: 08/21/2015 CLINICAL DATA:  Tripped and fell and chair today. Left supraorbital hematoma. Personal history of head trauma. No loss of consciousness. The patient was on the floor for 30 minutes prior to receiving help. EXAM: CT HEAD WITHOUT CONTRAST CT MAXILLOFACIAL WITHOUT CONTRAST CT CERVICAL SPINE WITHOUT CONTRAST TECHNIQUE: Multidetector CT imaging of the head, cervical spine, and maxillofacial structures were performed using the standard protocol without intravenous contrast. Multiplanar CT image reconstructions of the cervical spine and maxillofacial structures were also generated. COMPARISON:  CT head and cervical spine 01/02/2013. FINDINGS: CT HEAD FINDINGS Moderate generalized atrophy and diffuse white matter disease is present. No acute infarct, hemorrhage,  or mass lesion is present. The ventricles are proportionate to the degree of atrophy. No significant extra-axial fluid collection is present. Extensive atherosclerotic calcifications are present throughout the cavernous internal carotid arteries vertebrobasilar system. The paranasal sinuses and mastoid air cells are clear. A left supraorbital scalp hematoma measures 2.7 x 1.6 x 1.7 cm without an underlying fracture. CT MAXILLOFACIAL FINDINGS The left supraorbital scalp hematoma is again seen. There is no underlying fracture. The paranasal sinuses and mastoid air cells are clear. Bilateral lens replacements are present. The globes and orbits are otherwise intact. There is no significant postseptal inflammation on the left. Minimal infraorbital edema is present on the left. The mandible is intact and located. Torus mandibularis are noted bilaterally. Soft tissues of the face and muscles of mastication are otherwise within normal limits. Salivary  glands are unremarkable. CT CERVICAL SPINE FINDINGS The cervical spine is imaged from the skull base through T2-3. Chronic loss of disc height is present at each level from C4-5 through C7-T1. There slight degenerative anterolisthesis at C4-5. Chronic endplate changes and uncovertebral spurring is present at these levels in particular. No acute fracture traumatic subluxation is present. Osseous foraminal narrowing is most pronounced at C5-6 and C6-7 bilaterally. There is heterogeneous enlargement of the thyroid bilaterally. No dominant nodule is present. The lung apices are clear. IMPRESSION: 1. Moderate atrophy and white matter disease without acute intracranial abnormality. 2. 2.7 x 1.6 x 1.7 cm left supraorbital scalp hematoma without underlying fracture or globe injury. 3. Minimal infraorbital soft tissue swelling without other significant facial trauma. 4. Moderate multilevel spondylosis of the cervical spine without evidence for acute fracture or traumatic subluxation. Electronically Signed   By: San Morelle M.D.   On: 08/21/2015 11:58   EKG: Independently reviewed. Flipped T-waves in inferior and anteriorlateral leads which is new from previous (2014) EKG  Assessment/Plan Active Problems:   Closed left hip fracture (HCC)   Left hip fracture, closed Secondary to fall while ambulating with a walker. Perioperative risk of <1% for MACE however, current NSTEMI. She is higher risk for orthopedic surgical management of her hip fracture. Family wishes for surgery, however, with high risk nature, will need to discuss risks vs benefits. -cardiology consulted and will see tonight. -NPO after midnight -Ortho held surgery until tomorrow  Nstemi Elevated troponin and new ST-T changes (t-wave inversion) on EKG. No chest pain, however patient's history not very reliable secondary to dementia. - cardiology consulted -hold heparin for now per cardiology  Left sided facial contusion no  fracture -wound care  Hypokalemia - 4x potassium chloride 22meq IV  Hypertension -continue lisinopril 40mg  daily -continue metoprolol succinate 100mg  daily  Dementia -continue donepezil 10mg  qhs  Diabetes mellitus, type 2, not on insulin - SSI sensitive   DVT prophylaxis: Lovenox Code Status: DNR Family Communication: Daughter, [name] Hanzaker (HCPOA) Disposition Plan: OR for hip surgery Consults called: Orthopedic surgery by ED Admission status: Inpatient, medical floor   Cordelia Poche MD [facility] Pager [phone]  If 7PM-7AM, please contact night-coverage [email or web address] Password [facility]  08/21/2015, 4:42 PM

## 2015-08-21 NOTE — ED Notes (Signed)
Bed: WA02 Expected date:  Expected time:  Means of arrival:  Comments: 82 yo fall, L eye hematoma, no thinners

## 2015-08-21 NOTE — ED Triage Notes (Signed)
Per daughter, pt with HX of severe head trauma in past post fall. Pt at present at baseline. Pt aware of fall. Pt c/o of left hip pain. Pt presents with left hip shortening, rotation and deformity to hip. Pt states she was in chair and went to get up to bathroom and slipped/fell onto floor. Pt states "there an hour" Large contusion to left eyebrow, eye and cheek. Pt denies visual problems. Pt is HOH.

## 2015-08-21 NOTE — Progress Notes (Signed)
Critical lab: Troponin 0.13, Dr. Lonny Prude notified. Callback from Dr. Lonny Prude, cardiology notified. No new orders at this time.

## 2015-08-21 NOTE — Progress Notes (Signed)
CRITICAL VALUE ALERT  Critical value received:  Troponin  Date of notification:  08/21/15  Time of notification:  2003  Critical value read back:Yes  Nurse who received alert:  [employer]  MD notified (1st page): Dr.Nettey  Time of first page:  2003  MD notified (2nd page):N/A  Time of second page:N/A  Responding MD: Dr. Lonny Prude  Time MD responded: 2008

## 2015-08-21 NOTE — Progress Notes (Signed)
Spoke with hospitalist. Has acute EKG changes, troponin is elevated. Cardiology consult pending. Will cancel surgery for now. Will reschedule when safe to do so.

## 2015-08-21 NOTE — ED Triage Notes (Signed)
Per EMS, pt with hx of dementia sent from [facility] for unwitnessed fall from chair today. Hematoma above left eye. Pt c/o left hip pain at facility, pt states she has chronic left hip pain at all times. Pt reports she tripped and fell on chair then laid on floor for 30 minutes while waiting for help. No anticoagulants. Only antiplatelet is aspirin.

## 2015-08-21 NOTE — ED Notes (Signed)
DNR AND MOST FORM PRESENT WITH PT

## 2015-08-21 NOTE — ED Provider Notes (Signed)
[facility] DEPT Provider Note   CSN: [id number] Arrival date & time: 08/21/15  [id number]  First Provider Contact:  None       History   Chief Complaint Chief Complaint  Teresa Richards presents with  . Fall    HPI Teresa Richards is a 82 y.o. female.  HPI 82 year old female with past medical history of hypertension, diabetes, AFib not on blood thinners who presents with mechanical fall. Per report from the Teresa Richards and Teresa Richards daughter, the Teresa Richards was attempting to get out of bed today when she fell directly on Teresa Richards left hip and face. She reports that she was down for 1 hour. It is unknown if she was down for this long. When nursing arrived, she was unable to stand up due to severe pain in Teresa Richards hip. She also large contusion on Teresa Richards head. She says when brought for further evaluation. On arrival, Teresa Richards endorses 2/10 left facial pain. Denies any vision changes or pain with extraocular movements. Denies any double vision. Denies any abdominal pain, chest pain, shortness of breath. She also has left hip pain that she describes as an aching, gnawing sensation that is made worse with any movement of Teresa Richards leg  Past Medical History:  Diagnosis Date  . Atrial fibrillation ([place])   . Bradycardia   . Dementia   . Diabetes mellitus without complication ([place])   . Glaucoma   . Hypertension   . Lower back pain   . Melanoma ([place]) 1960   left shoulder skin  . Muscle pain   . Peripheral neuropathy ([place])   . Umbilical hernia     Teresa Richards Active Problem List   Diagnosis Date Noted  . Constipation 11/16/2014  . Urinary incontinence 11/09/2014  . Abnormal TSH 10/26/2014  . Diabetic neuropathy ([place]) 10/27/2013  . Idiopathic scoliosis 10/27/2013  . Type 2 diabetes, controlled, with neuropathy ([place]) 04/21/2013  . Vertebral fracture 11/11/2012  . DNR (do not resuscitate) 07/04/2012  . Stroke ([place]) 05/19/2012  . HTN (hypertension) 05/19/2012  . 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 Anxiety state 05/13/2012  .  Lumbago 04/25/2012  . Abnormality of gait 04/25/2012  . Fall at nursing home 03/08/2012  . Subdural hematoma ([place]) 03/08/2012  . Dementia 03/08/2012    Past Surgical History:  Procedure Laterality Date  . SKIN CANCER EXCISION Left 1960's   melanoma shoulder    OB History    No data available       Home Medications    Prior to Admission medications   Medication Sig Start Date End Date Taking? Authorizing Provider  acetaminophen (TYLENOL) 325 MG tablet Take 650 mg by mouth every 4 (four) hours as needed for moderate pain.   Yes Historical Provider, MD  aspirin 81 MG tablet Take 81 mg by mouth daily.   Yes Historical Provider, MD  Calcium Polycarbophil (FIBER-CAPS PO) Take 1 capsule by mouth daily.   Yes Historical Provider, MD  donepezil (ARICEPT) 10 MG tablet Take 10 mg by mouth at bedtime.   Yes Historical Provider, MD  feeding supplement (BOOST / RESOURCE BREEZE) LIQD Take 1 Container by mouth 2 (two) times daily between meals.   Yes Historical Provider, MD  ipratropium-albuterol (DUONEB) 0.5-2.5 (3) MG/3ML SOLN Take 3 mLs by nebulization 2 (two) times daily as needed (for Shortness of breath).   Yes Historical Provider, MD  lisinopril (PRINIVIL,ZESTRIL) 40 MG tablet Take 40 mg by mouth daily. For HTN   Yes Historical Provider, MD  memantine (NAMENDA) 10 MG tablet Take 10  mg by mouth 2 (two) times daily.   Yes Historical Provider, MD  metoprolol succinate (TOPROL-XL) 100 MG 24 hr tablet Take 100 mg by mouth daily. Take with or immediately following a meal.   Yes Historical Provider, MD  Multiple Vitamins-Minerals (CERTAVITE/ANTIOXIDANTS PO) Take 1 tablet by mouth daily.   Yes Historical Provider, MD  sennosides-docusate sodium (SENOKOT-S) 8.6-50 MG tablet Take 1 tablet by mouth daily. Hold for loose stool    Yes Historical Provider, MD    Family History Family History  Problem Relation Age of Onset  . Leukemia Father   . Heart disease Brother     Social History Social  History  Substance Use Topics  . Smoking status: Never Smoker  . Smokeless tobacco: Never Used  . Alcohol use No     Comment: Teresa Richards drinks coffee occasional.     Allergies   Codeine   Review of Systems Review of Systems  Constitutional: Positive for fatigue. Negative for chills and fever.  HENT: Positive for facial swelling. Negative for congestion and rhinorrhea.   Eyes: Negative for visual disturbance.  Respiratory: Negative for cough, chest tightness, shortness of breath and wheezing.   Cardiovascular: Negative for chest pain and leg swelling.  Gastrointestinal: Negative for abdominal pain, diarrhea, nausea and vomiting.  Genitourinary: Negative for dysuria and flank pain.  Musculoskeletal: Positive for gait problem (due to hip pain). Negative for neck pain.  Skin: Positive for wound. Negative for rash.  Neurological: Positive for weakness (generalized). Negative for syncope and headaches.     Physical Exam Updated Vital Signs BP (!) 182/102 (BP Location: Right Arm)   Pulse 68   Temp 97.5 F (36.4 C) (Oral)   Resp 16   SpO2 93%   Physical Exam  Constitutional: She appears well-developed and well-nourished. No distress.  HENT:  Large contusion to left brow with superficial abrasion. No lacerations. No bogginess or bony step-offs. Orbit appears intact. Superficial bruising to left upper lip, with underlying minor dental fx (old fx, filling is off). No gingival bleeding. No lip lacerations.  Eyes: Conjunctivae and EOM are normal. Pupils are equal, round, and reactive to light.  No entrapment  Neck: Normal range of motion. Neck supple.  Cardiovascular: Normal rate, regular rhythm and normal heart sounds.  Exam reveals no friction rub.   No murmur heard. Pulmonary/Chest: Effort normal and breath sounds normal. No respiratory distress. She has no wheezes. She has no rales.  Abdominal: Soft. She exhibits no distension. There is no tenderness.  Musculoskeletal: She  exhibits no edema.  Neurological: She is alert.  MAE with 5/5 strength. ROM in left hip limited 2/2 pain. Normal sensation to light touch. No facial asymmetry. Tongue protrusion midline.  Nursing note and vitals reviewed.   LOWER EXTREMITY EXAM: LEFT  INSPECTION & PALPATION: Moderate TTP over left hip with painful ROM. Left leg shortened and externally rotated.   SENSORY: sensation is intact to light touch in:  Superficial peroneal nerve distribution (over dorsum of foot) Deep peroneal nerve distribution (over first dorsal web space) Sural nerve distribution (over lateral aspect 5th metatarsal) Saphenous nerve distribution (over medial instep)  MOTOR:  + Motor EHL (great toe dorsiflexion) + FHL (great toe plantar flexion)  + TA (ankle dorsiflexion)  + GSC (ankle plantar flexion)  VASCULAR: 2+ dorsalis pedis and posterior tibialis pulses Capillary refill < 2 sec, toes warm and well-perfused  COMPARTMENTS: Soft, warm, well-perfused No pain with passive extension No parethesias     ED Treatments / Results  Labs (all labs ordered are listed, but only abnormal results are displayed) Labs Reviewed  CBC WITH DIFFERENTIAL/PLATELET - Abnormal; Notable for the following:       Result Value   WBC 12.0 (*)    Neutro Abs 10.6 (*)    All other components within normal limits  COMPREHENSIVE METABOLIC PANEL - Abnormal; Notable for the following:    Potassium 3.1 (*)    Glucose, Bld 137 (*)    All other components within normal limits  TROPONIN I - Abnormal; Notable for the following:    Troponin I 0.13 (*)    All other components within normal limits  SURGICAL PCR SCREEN  COMPREHENSIVE METABOLIC PANEL  CBC WITH DIFFERENTIAL/PLATELET  TYPE AND SCREEN  ABO/RH    EKG  EKG Interpretation None       Radiology Ct Head Wo Contrast  Result Date: 08/21/2015 CLINICAL DATA:  Tripped and fell and chair today. Left supraorbital hematoma. Personal history of head trauma. No loss  of consciousness. The Teresa Richards was on the floor for 30 minutes prior to receiving help. EXAM: CT HEAD WITHOUT CONTRAST CT MAXILLOFACIAL WITHOUT CONTRAST CT CERVICAL SPINE WITHOUT CONTRAST TECHNIQUE: Multidetector CT imaging of the head, cervical spine, and maxillofacial structures were performed using the standard protocol without intravenous contrast. Multiplanar CT image reconstructions of the cervical spine and maxillofacial structures were also generated. COMPARISON:  CT head and cervical spine 01/02/2013. FINDINGS: CT HEAD FINDINGS Moderate generalized atrophy and diffuse white matter disease is present. No acute infarct, hemorrhage, or mass lesion is present. The ventricles are proportionate to the degree of atrophy. No significant extra-axial fluid collection is present. Extensive atherosclerotic calcifications are present throughout the cavernous internal carotid arteries vertebrobasilar system. The paranasal sinuses and mastoid air cells are clear. A left supraorbital scalp hematoma measures 2.7 x 1.6 x 1.7 cm without an underlying fracture. CT MAXILLOFACIAL FINDINGS The left supraorbital scalp hematoma is again seen. There is no underlying fracture. The paranasal sinuses and mastoid air cells are clear. Bilateral lens replacements are present. The globes and orbits are otherwise intact. There is no significant postseptal inflammation on the left. Minimal infraorbital edema is present on the left. The mandible is intact and located. Torus mandibularis are noted bilaterally. Soft tissues of the face and muscles of mastication are otherwise within normal limits. Salivary glands are unremarkable. CT CERVICAL SPINE FINDINGS The cervical spine is imaged from the skull base through T2-3. Chronic loss of disc height is present at each level from C4-5 through C7-T1. There slight degenerative anterolisthesis at C4-5. Chronic endplate changes and uncovertebral spurring is present at these levels in particular. No  acute fracture traumatic subluxation is present. Osseous foraminal narrowing is most pronounced at C5-6 and C6-7 bilaterally. There is heterogeneous enlargement of the thyroid bilaterally. No dominant nodule is present. The lung apices are clear. IMPRESSION: 1. Moderate atrophy and white matter disease without acute intracranial abnormality. 2. 2.7 x 1.6 x 1.7 cm left supraorbital scalp hematoma without underlying fracture or globe injury. 3. Minimal infraorbital soft tissue swelling without other significant facial trauma. 4. Moderate multilevel spondylosis of the cervical spine without evidence for acute fracture or traumatic subluxation. Electronically Signed   By: San Morelle M.D.   On: 08/21/2015 11:58  Ct Cervical Spine Wo Contrast  Result Date: 08/21/2015 CLINICAL DATA:  Tripped and fell and chair today. Left supraorbital hematoma. Personal history of head trauma. No loss of consciousness. The Teresa Richards was on the floor for 30 minutes prior to  receiving help. EXAM: CT HEAD WITHOUT CONTRAST CT MAXILLOFACIAL WITHOUT CONTRAST CT CERVICAL SPINE WITHOUT CONTRAST TECHNIQUE: Multidetector CT imaging of the head, cervical spine, and maxillofacial structures were performed using the standard protocol without intravenous contrast. Multiplanar CT image reconstructions of the cervical spine and maxillofacial structures were also generated. COMPARISON:  CT head and cervical spine 01/02/2013. FINDINGS: CT HEAD FINDINGS Moderate generalized atrophy and diffuse white matter disease is present. No acute infarct, hemorrhage, or mass lesion is present. The ventricles are proportionate to the degree of atrophy. No significant extra-axial fluid collection is present. Extensive atherosclerotic calcifications are present throughout the cavernous internal carotid arteries vertebrobasilar system. The paranasal sinuses and mastoid air cells are clear. A left supraorbital scalp hematoma measures 2.7 x 1.6 x 1.7 cm without an  underlying fracture. CT MAXILLOFACIAL FINDINGS The left supraorbital scalp hematoma is again seen. There is no underlying fracture. The paranasal sinuses and mastoid air cells are clear. Bilateral lens replacements are present. The globes and orbits are otherwise intact. There is no significant postseptal inflammation on the left. Minimal infraorbital edema is present on the left. The mandible is intact and located. Torus mandibularis are noted bilaterally. Soft tissues of the face and muscles of mastication are otherwise within normal limits. Salivary glands are unremarkable. CT CERVICAL SPINE FINDINGS The cervical spine is imaged from the skull base through T2-3. Chronic loss of disc height is present at each level from C4-5 through C7-T1. There slight degenerative anterolisthesis at C4-5. Chronic endplate changes and uncovertebral spurring is present at these levels in particular. No acute fracture traumatic subluxation is present. Osseous foraminal narrowing is most pronounced at C5-6 and C6-7 bilaterally. There is heterogeneous enlargement of the thyroid bilaterally. No dominant nodule is present. The lung apices are clear. IMPRESSION: 1. Moderate atrophy and white matter disease without acute intracranial abnormality. 2. 2.7 x 1.6 x 1.7 cm left supraorbital scalp hematoma without underlying fracture or globe injury. 3. Minimal infraorbital soft tissue swelling without other significant facial trauma. 4. Moderate multilevel spondylosis of the cervical spine without evidence for acute fracture or traumatic subluxation. Electronically Signed   By: San Morelle M.D.   On: 08/21/2015 11:58  Dg Chest Portable 1 View  Result Date: 08/21/2015 CLINICAL DATA:  Unwitnessed fall from chair today.  Hip fracture. EXAM: PORTABLE CHEST 1 VIEW COMPARISON:  Two-view chest x-ray 01/02/2013 FINDINGS: The heart size is normal. Lung volumes remain low. Atherosclerotic changes are noted at the aortic arch. Chronic  interstitial changes are stable. No acute fractures are present.  There is no pneumothorax. IMPRESSION: 1. No acute cardiopulmonary disease or significant interval change. 2. Stable chronic interstitial coarsening. 3. Atherosclerosis of the thoracic aorta. Electronically Signed   By: San Morelle M.D.   On: 08/21/2015 12:17  Dg Hip Unilat W Or Wo Pelvis 2-3 Views Left  Result Date: 08/21/2015 CLINICAL DATA:  Fall at home.  Left hip pain. EXAM: DG HIP (WITH OR WITHOUT PELVIS) 2-3V LEFT COMPARISON:  None. FINDINGS: There is a left femoral intertrochanteric fracture with mild displacement. Slight varus angulation. No subluxation or dislocation. Mild degenerative changes in the hips bilaterally. IMPRESSION: Left femoral intertrochanteric fracture with mild displacement and varus angulation. Electronically Signed   By: Rolm Baptise M.D.   On: 08/21/2015 11:29  Dg Femur Min 2 Views Left  Result Date: 08/21/2015 CLINICAL DATA:  Unwitnessed fall. Teresa Richards complains of left hip pain. EXAM: LEFT FEMUR 2 VIEWS COMPARISON:  Hip radiograph 08/21/2015 FINDINGS: Re- demonstrated is left femoral  intertrochanteric oblique fracture with mild displacement and varus angulation of the distal fracture fragment. There is generalized osteopenia. No distal femoral fractures are seen. IMPRESSION: Known left femoral intertrochanteric fracture with mild displacement and varus angulation. Osteopenia. Electronically Signed   By: Fidela Salisbury M.D.   On: 08/21/2015 13:11  Ct Maxillofacial Wo Contrast  Result Date: 08/21/2015 CLINICAL DATA:  Tripped and fell and chair today. Left supraorbital hematoma. Personal history of head trauma. No loss of consciousness. The Teresa Richards was on the floor for 30 minutes prior to receiving help. EXAM: CT HEAD WITHOUT CONTRAST CT MAXILLOFACIAL WITHOUT CONTRAST CT CERVICAL SPINE WITHOUT CONTRAST TECHNIQUE: Multidetector CT imaging of the head, cervical spine, and maxillofacial structures were  performed using the standard protocol without intravenous contrast. Multiplanar CT image reconstructions of the cervical spine and maxillofacial structures were also generated. COMPARISON:  CT head and cervical spine 01/02/2013. FINDINGS: CT HEAD FINDINGS Moderate generalized atrophy and diffuse white matter disease is present. No acute infarct, hemorrhage, or mass lesion is present. The ventricles are proportionate to the degree of atrophy. No significant extra-axial fluid collection is present. Extensive atherosclerotic calcifications are present throughout the cavernous internal carotid arteries vertebrobasilar system. The paranasal sinuses and mastoid air cells are clear. A left supraorbital scalp hematoma measures 2.7 x 1.6 x 1.7 cm without an underlying fracture. CT MAXILLOFACIAL FINDINGS The left supraorbital scalp hematoma is again seen. There is no underlying fracture. The paranasal sinuses and mastoid air cells are clear. Bilateral lens replacements are present. The globes and orbits are otherwise intact. There is no significant postseptal inflammation on the left. Minimal infraorbital edema is present on the left. The mandible is intact and located. Torus mandibularis are noted bilaterally. Soft tissues of the face and muscles of mastication are otherwise within normal limits. Salivary glands are unremarkable. CT CERVICAL SPINE FINDINGS The cervical spine is imaged from the skull base through T2-3. Chronic loss of disc height is present at each level from C4-5 through C7-T1. There slight degenerative anterolisthesis at C4-5. Chronic endplate changes and uncovertebral spurring is present at these levels in particular. No acute fracture traumatic subluxation is present. Osseous foraminal narrowing is most pronounced at C5-6 and C6-7 bilaterally. There is heterogeneous enlargement of the thyroid bilaterally. No dominant nodule is present. The lung apices are clear. IMPRESSION: 1. Moderate atrophy and white  matter disease without acute intracranial abnormality. 2. 2.7 x 1.6 x 1.7 cm left supraorbital scalp hematoma without underlying fracture or globe injury. 3. Minimal infraorbital soft tissue swelling without other significant facial trauma. 4. Moderate multilevel spondylosis of the cervical spine without evidence for acute fracture or traumatic subluxation. Electronically Signed   By: San Morelle M.D.   On: 08/21/2015 11:58   Procedures Procedures (including critical care time)  Medications Ordered in ED Medications  povidone-iodine 10 % swab 2 application (2 application Topical Not Given 08/21/15 1659)  ceFAZolin (ANCEF) IVPB 2g/100 mL premix (0 g Intravenous Hold 08/21/15 1810)  0.9 %  sodium chloride infusion ( Intravenous New Bag/Given 08/21/15 1706)  feeding supplement (BOOST / RESOURCE BREEZE) liquid 1 Container (not administered)  donepezil (ARICEPT) tablet 10 mg (not administered)  memantine (NAMENDA) tablet 10 mg (not administered)  senna-docusate (Senokot-S) tablet 1 tablet (1 tablet Oral Not Given 08/21/15 1811)  metoprolol succinate (TOPROL-XL) 24 hr tablet 100 mg (not administered)  aspirin EC tablet 81 mg (not administered)  lisinopril (PRINIVIL,ZESTRIL) tablet 40 mg (not administered)  0.45 % sodium chloride infusion ( Intravenous New Bag/Given 08/21/15  1824)  potassium chloride 10 mEq in 100 mL IVPB (not administered)  fentaNYL (SUBLIMAZE) injection 25 mcg (25 mcg Intravenous Given 08/21/15 1234)  0.9 %  sodium chloride infusion ( Intravenous Transfusing/Transfer 08/21/15 1511)  chlorhexidine (HIBICLENS) 4 % liquid 4 application (4 application Topical Given 08/21/15 1655)     Initial Impression / Assessment and Plan / ED Course  I have reviewed the triage vital signs and the nursing notes.  Pertinent labs & imaging results that were available during my care of the Teresa Richards were reviewed by me and considered in my medical decision making (see chart for  details).  Clinical Course  82 year old female with past medical history as above who presents with left facial contusion and left hip pain status post mechanical fall. Pt has h/o recurrent falls. Plain film show left intertrochanteric hip fracture. CT of the head and face show no facial fracture. She has no evidence of orbital fracture or entrapment on exam or history. Vital signs are otherwise stable, an initial screening lab work is unremarkable. Per report from pt's daughter, pt was at baseline state of health prior to fall and is at baseline mental status currently. No signs of CVA. Denies any CP, SOB, and VSS. Will consult orthopedics for operative management and hospitalist for admission and medical clearance. Otherwise, preoperative chest x-ray shows no acute abnormality. Plan of care discussed with Teresa Richards and daughter in detail, who are in agreement with this plan   Final Clinical Impressions(s) / ED Diagnoses   Final diagnoses:  Closed left hip fracture, initial encounter [facility])  Facial contusion, initial encounter  Abrasion  Dementia, without behavioral disturbance    New Prescriptions Current Discharge Medication List       Duffy Bruce, MD 08/21/15 2153

## 2015-08-21 NOTE — Progress Notes (Signed)
Consult acknowledged. Will plan for surgery this evening pending hospitalist medical clearance. Will see patient ASAP.

## 2015-08-22 ENCOUNTER — Inpatient Hospital Stay (HOSPITAL_COMMUNITY): Payer: Medicare Other

## 2015-08-22 ENCOUNTER — Encounter (HOSPITAL_COMMUNITY): Payer: Self-pay | Admitting: Anesthesiology

## 2015-08-22 ENCOUNTER — Inpatient Hospital Stay (HOSPITAL_COMMUNITY): Payer: Medicare Other | Admitting: Certified Registered Nurse Anesthetist

## 2015-08-22 ENCOUNTER — Encounter (HOSPITAL_COMMUNITY)
Admission: EM | Disposition: A | Discharge status: Skilled Nursing Facility | Payer: Self-pay | Source: Home / Self Care | Attending: Family Medicine

## 2015-08-22 DIAGNOSIS — Z01810 Encounter for preprocedural cardiovascular examination: Secondary | ICD-10-CM

## 2015-08-22 DIAGNOSIS — S72142A Displaced intertrochanteric fracture of left femur, initial encounter for closed fracture: Secondary | ICD-10-CM | POA: Diagnosis present

## 2015-08-22 DIAGNOSIS — I214 Non-ST elevation (NSTEMI) myocardial infarction: Secondary | ICD-10-CM

## 2015-08-22 HISTORY — PX: FRACTURE SURGERY: SHX138

## 2015-08-22 HISTORY — PX: INTRAMEDULLARY (IM) NAIL INTERTROCHANTERIC: SHX5875

## 2015-08-22 LAB — CBC WITH DIFFERENTIAL/PLATELET
Basophils Absolute: 0 10*3/uL (ref 0.0–0.1)
Basophils Relative: 0 %
EOS ABS: 0 10*3/uL (ref 0.0–0.7)
EOS PCT: 0 %
HCT: 34.6 % — ABNORMAL LOW (ref 36.0–46.0)
Hemoglobin: 11.5 g/dL — ABNORMAL LOW (ref 12.0–15.0)
LYMPHS ABS: 1.3 10*3/uL (ref 0.7–4.0)
Lymphocytes Relative: 17 %
MCH: 27.8 pg (ref 26.0–34.0)
MCHC: 33.2 g/dL (ref 30.0–36.0)
MCV: 83.6 fL (ref 78.0–100.0)
MONO ABS: 1.3 10*3/uL — AB (ref 0.1–1.0)
MONOS PCT: 16 %
Neutro Abs: 5.4 10*3/uL (ref 1.7–7.7)
Neutrophils Relative %: 67 %
PLATELETS: 160 10*3/uL (ref 150–400)
RBC: 4.14 MIL/uL (ref 3.87–5.11)
RDW: 14.5 % (ref 11.5–15.5)
WBC: 8 10*3/uL (ref 4.0–10.5)

## 2015-08-22 LAB — COMPREHENSIVE METABOLIC PANEL
ALT: 13 U/L — AB (ref 14–54)
AST: 15 U/L (ref 15–41)
Albumin: 3.6 g/dL (ref 3.5–5.0)
Alkaline Phosphatase: 51 U/L (ref 38–126)
Anion gap: 7 (ref 5–15)
BUN: 13 mg/dL (ref 6–20)
CHLORIDE: 107 mmol/L (ref 101–111)
CO2: 23 mmol/L (ref 22–32)
CREATININE: 0.66 mg/dL (ref 0.44–1.00)
Calcium: 8.5 mg/dL — ABNORMAL LOW (ref 8.9–10.3)
GFR calc Af Amer: >60 mL/min (ref >60)
GFR calc non Af Amer: >60 mL/min (ref >60)
Glucose, Bld: 105 mg/dL — ABNORMAL HIGH (ref 65–99)
Potassium: 4.3 mmol/L (ref 3.5–5.1)
SODIUM: 137 mmol/L (ref 135–145)
Total Bilirubin: 1.2 mg/dL (ref 0.3–1.2)
Total Protein: 5.9 g/dL — ABNORMAL LOW (ref 6.5–8.1)

## 2015-08-22 LAB — MAGNESIUM: Magnesium: 1.6 mg/dL — ABNORMAL LOW (ref 1.7–2.4)

## 2015-08-22 LAB — ECHOCARDIOGRAM COMPLETE
Height: 61 in
Weight: 1904 oz

## 2015-08-22 LAB — TROPONIN I
TROPONIN I: 0.26 ng/mL — AB (ref <0.03)
Troponin I: 0.29 ng/mL (ref <0.03)

## 2015-08-22 LAB — ABO/RH: ABO/RH(D): B POS

## 2015-08-22 LAB — SURGICAL PCR SCREEN
MRSA, PCR: NEGATIVE
Staphylococcus aureus: NEGATIVE

## 2015-08-22 LAB — GLUCOSE, CAPILLARY: GLUCOSE-CAPILLARY: 98 mg/dL (ref 65–99)

## 2015-08-22 SURGERY — INSERTION, INTRAMEDULLARY ROD, FEMUR
Anesthesia: Choice | Laterality: Left

## 2015-08-22 SURGERY — FIXATION, FRACTURE, INTERTROCHANTERIC, WITH INTRAMEDULLARY ROD
Anesthesia: Spinal | Site: Leg Upper | Laterality: Left

## 2015-08-22 SURGERY — FIXATION, FRACTURE, INTERTROCHANTERIC, WITH INTRAMEDULLARY ROD
Anesthesia: Choice | Laterality: Left

## 2015-08-22 MED ORDER — CEFAZOLIN SODIUM 1 G IJ SOLR
INTRAMUSCULAR | Status: AC
  Filled 2015-08-22: qty 20

## 2015-08-22 MED ORDER — FENTANYL CITRATE (PF) 250 MCG/5ML IJ SOLN
INTRAMUSCULAR | Status: DC | PRN
  Administered 2015-08-22: 25 ug via INTRAVENOUS

## 2015-08-22 MED ORDER — FENTANYL CITRATE (PF) 100 MCG/2ML IJ SOLN
25.0000 ug | INTRAMUSCULAR | Status: DC | PRN
Start: 2015-08-22 — End: 2015-08-23

## 2015-08-22 MED ORDER — LACTATED RINGERS IV SOLN
INTRAVENOUS | Status: DC | PRN
  Administered 2015-08-22: 21:00:00 via INTRAVENOUS

## 2015-08-22 MED ORDER — CEFAZOLIN SODIUM-DEXTROSE 2-4 GM/100ML-% IV SOLN
2.0000 g | INTRAVENOUS | Status: DC
  Filled 2015-08-22: qty 100

## 2015-08-22 MED ORDER — CHLORHEXIDINE GLUCONATE 4 % EX LIQD: 60.0000 mL | Freq: Once | CUTANEOUS | Status: DC

## 2015-08-22 MED ORDER — ALBUMIN HUMAN 5 % IV SOLN
INTRAVENOUS | Status: DC | PRN
Start: 2015-08-22 — End: 2015-08-22
  Administered 2015-08-22: 21:00:00 via INTRAVENOUS

## 2015-08-22 MED ORDER — MAGNESIUM SULFATE 2 GM/50ML IV SOLN
2.0000 g | Freq: Once | INTRAVENOUS | Status: AC
  Administered 2015-08-22: 2 g via INTRAVENOUS
  Filled 2015-08-22: qty 50

## 2015-08-22 MED ORDER — 0.9 % SODIUM CHLORIDE (POUR BTL) OPTIME
TOPICAL | Status: DC | PRN
  Administered 2015-08-22: 1000 mL

## 2015-08-22 MED ORDER — SODIUM CHLORIDE 0.9 % IV SOLN
INTRAVENOUS | Status: DC
  Administered 2015-08-23: 03:00:00 via INTRAVENOUS

## 2015-08-22 MED ORDER — BUPIVACAINE HCL (PF) 0.75 % IJ SOLN
INTRAMUSCULAR | Status: DC | PRN
  Administered 2015-08-22: 1 mL via INTRATHECAL

## 2015-08-22 MED ORDER — PROPOFOL 500 MG/50ML IV EMUL
INTRAVENOUS | Status: DC | PRN
  Administered 2015-08-22: 12.5 ug/kg/min via INTRAVENOUS

## 2015-08-22 MED ORDER — POVIDONE-IODINE 10 % EX SWAB: 2.0000 "application " | Freq: Once | CUTANEOUS | Status: DC

## 2015-08-22 MED ORDER — ENSURE ENLIVE PO LIQD
237.0000 mL | Freq: Two times a day (BID) | ORAL | Status: DC
  Administered 2015-08-23 – 2015-08-24 (×4): 237 mL via ORAL

## 2015-08-22 MED ORDER — FENTANYL CITRATE (PF) 250 MCG/5ML IJ SOLN
INTRAMUSCULAR | Status: AC
  Filled 2015-08-22: qty 5

## 2015-08-22 MED ORDER — CEFAZOLIN SODIUM-DEXTROSE 2-3 GM-% IV SOLR
INTRAVENOUS | Status: DC | PRN
  Administered 2015-08-22: 2 g via INTRAVENOUS

## 2015-08-22 MED ORDER — PHENYLEPHRINE HCL 10 MG/ML IJ SOLN
INTRAVENOUS | Status: DC | PRN
  Administered 2015-08-22: 25 ug/min via INTRAVENOUS

## 2015-08-22 MED ORDER — PROPOFOL 10 MG/ML IV BOLUS
INTRAVENOUS | Status: DC | PRN
  Administered 2015-08-22: 20 mg via INTRAVENOUS
  Administered 2015-08-22: 10 mg via INTRAVENOUS

## 2015-08-22 SURGICAL SUPPLY — 52 items
ADH SKN CLS APL DERMABOND .7 (GAUZE/BANDAGES/DRESSINGS) ×2
ALCOHOL ISOPROPYL (RUBBING) (MISCELLANEOUS) ×3 IMPLANT
BIT DRILL 4.2 (DRILL) IMPLANT
BNDG COHESIVE 4X5 TAN STRL (GAUZE/BANDAGES/DRESSINGS) IMPLANT
CHLORAPREP W/TINT 26ML (MISCELLANEOUS) ×3 IMPLANT
COVER PERINEAL POST (MISCELLANEOUS) ×3 IMPLANT
COVER SURGICAL LIGHT HANDLE (MISCELLANEOUS) ×3 IMPLANT
DERMABOND ADVANCED (GAUZE/BANDAGES/DRESSINGS) ×4
DERMABOND ADVANCED .7 DNX12 (GAUZE/BANDAGES/DRESSINGS) ×2 IMPLANT
DRAPE C-ARM 42X72 X-RAY (DRAPES) ×3 IMPLANT
DRAPE C-ARMOR (DRAPES) ×3 IMPLANT
DRAPE HALF SHEET 40X57 (DRAPES) ×2 IMPLANT
DRAPE IMP U-DRAPE 54X76 (DRAPES) ×8 IMPLANT
DRAPE STERI IOBAN 125X83 (DRAPES) ×3 IMPLANT
DRAPE U-SHAPE 47X51 STRL (DRAPES) ×6 IMPLANT
DRAPE UNIVERSAL PACK (DRAPES) ×3 IMPLANT
DRILL 4.2 (DRILL) ×6
DRSG AQUACEL AG ADV 3.5X 4 (GAUZE/BANDAGES/DRESSINGS) ×4 IMPLANT
DRSG AQUACEL AG ADV 3.5X 6 (GAUZE/BANDAGES/DRESSINGS) ×2 IMPLANT
DRSG MEPILEX BORDER 4X4 (GAUZE/BANDAGES/DRESSINGS) ×6 IMPLANT
DRSG PAD ABDOMINAL 8X10 ST (GAUZE/BANDAGES/DRESSINGS) IMPLANT
ELECT REM PT RETURN 9FT ADLT (ELECTROSURGICAL) ×3
ELECTRODE REM PT RTRN 9FT ADLT (ELECTROSURGICAL) ×1 IMPLANT
FACESHIELD WRAPAROUND (MASK) ×3 IMPLANT
FACESHIELD WRAPAROUND OR TEAM (MASK) ×1 IMPLANT
GLOVE BIO SURGEON STRL SZ8.5 (GLOVE) ×12 IMPLANT
GLOVE BIOGEL PI IND STRL 8.5 (GLOVE) ×1 IMPLANT
GLOVE BIOGEL PI INDICATOR 8.5 (GLOVE) ×2
GLOVE SS BIOGEL STRL SZ 8.5 (GLOVE) IMPLANT
GLOVE SUPERSENSE BIOGEL SZ 8.5 (GLOVE) ×2
GOWN STRL REUS W/ TWL LRG LVL3 (GOWN DISPOSABLE) ×2 IMPLANT
GOWN STRL REUS W/TWL 2XL LVL3 (GOWN DISPOSABLE) ×3 IMPLANT
GOWN STRL REUS W/TWL LRG LVL3 (GOWN DISPOSABLE) ×6
GUIDEWIRE 3.2X400 (WIRE) ×4 IMPLANT
KIT ROOM TURNOVER OR (KITS) ×3 IMPLANT
MANIFOLD NEPTUNE II (INSTRUMENTS) ×3 IMPLANT
MARKER SKIN DUAL TIP RULER LAB (MISCELLANEOUS) ×3 IMPLANT
NAIL 10/130 TI CAN TFNA 360HIP (Orthopedic Implant) ×2 IMPLANT
NS IRRIG 1000ML POUR BTL (IV SOLUTION) ×3 IMPLANT
PACK GENERAL/GYN (CUSTOM PROCEDURE TRAY) ×3 IMPLANT
PAD ARMBOARD 7.5X6 YLW CONV (MISCELLANEOUS) ×6 IMPLANT
PADDING CAST ABS 4INX4YD NS (CAST SUPPLIES)
PADDING CAST ABS COTTON 4X4 ST (CAST SUPPLIES) IMPLANT
REAMER ROD DEEP FLUTE 2.5X950 (INSTRUMENTS) ×2 IMPLANT
SCREW LOCKING 5.0MMX40MM (Screw) ×2 IMPLANT
SCREW TFNA P5MM STERILE (Screw) ×2 IMPLANT
SUT MNCRL AB 3-0 PS2 27 (SUTURE) ×3 IMPLANT
SUT MON AB 2-0 CT1 27 (SUTURE) ×3 IMPLANT
SUT MON AB 3-0 SH 27 (SUTURE) ×3
SUT MON AB 3-0 SH27 (SUTURE) IMPLANT
TOWEL OR 17X24 6PK STRL BLUE (TOWEL DISPOSABLE) ×3 IMPLANT
TOWEL OR 17X26 10 PK STRL BLUE (TOWEL DISPOSABLE) ×3 IMPLANT

## 2015-08-22 NOTE — Progress Notes (Signed)
Pt profile 82 year old female presented earlier with a left hip fracture when she fell out of a chair at the skilled nursing facility where she is staying.  She is currently a DO NOT RESUSCITATE and has a prior history of hypertension and diabetes and hyperlipidemia.  She has moderate dementia.  She is able to ambulate some.  There is no definite history of previous cardiac disease that could be ascertained from the chart.   She was found to have a large hematoma of her left eye and had a negative workup for any kind of intracerebral bleed.  The chest x-ray showed evidence of thoracic atherosclerosis and she also had previous cranial atherosclerosis noted on previous imaging.  She does have baseline diabetes and hypertension.  There is a note in the chart of atrial fibrillation but I cannot find any documentation of this.   Subjective: Pt sleeping, I did not awaken, Daughter in room - the pt's grandson is an MD- so his Mom asks very good questions.   Objective: Vital signs in last 24 hours: Temp:  [97.8 F (36.6 C)-98.6 F (37 C)] 98.6 F (37 C) (07/31 0500) Pulse Rate:  [67-84] 67 (07/31 0854) Resp:  [15-19] 16 (07/31 0500) BP: (121-177)/(62-92) 121/62 (07/31 0854) SpO2:  [92 %-97 %] 96 % (07/31 0500) Weight:  [119 lb (54 kg)] 119 lb (54 kg) (07/30 1037) Weight change:  Last BM Date: 08/21/15 Intake/Output from previous day: +873 07/30 0701 - 07/31 0700 In: 1423 [I.V.:1423] Out: 550 [Urine:550] Intake/Output this shift: Total I/O In: 0  Out: 250 [Urine:250]  PE: General:Pleasant affect, NAD Skin:Warm and dry, brisk capillary refill HEENT:normocephalic,  mucus membranes moist, Lt eye orbit and below swollen and bruisd. Neck:supple, no JVD  Heart:S1S2 RRR without murmur, gallup, rub or click Lungs:clear, ant. without rales, rhonchi, or wheezes [id number], non tender, + BS, do not palpate liver spleen or masses Ext:no lower ext edema,  2+ radial pulses, scds in  place Neuro:asleep, MAE, follows commands, + facial symmetry No tele  Lab Results:  Recent Labs  08/21/15 1209 08/22/15 0429  WBC 12.0* 8.0  HGB 13.9 11.5*  HCT 43.0 34.6*  PLT 177 160   BMET  Recent Labs  08/21/15 1209 08/22/15 0429  NA 141 137  K 3.1* 4.3  CL 105 107  CO2 26 23  GLUCOSE 137* 105*  BUN 11 13  CREATININE 0.60 0.66  CALCIUM 9.4 8.5*    Recent Labs  08/21/15 2257 08/22/15 0429  TROPONINI 0.16* 0.26*    Lab Results  Component Value Date   CHOL 191 05/20/2012   HDL 38 (L) 05/20/2012   LDLCALC 118 (H) 05/20/2012   TRIG 174 (H) 05/20/2012   CHOLHDL 5.0 05/20/2012   Lab Results  Component Value Date   HGBA1C 6.2 (A) 10/28/2014     Lab Results  Component Value Date   TSH 0.88 05/05/2015    Hepatic Function Panel  Recent Labs  08/22/15 0429  PROT 5.9*  ALBUMIN 3.6  AST 15  ALT 13*  ALKPHOS 51  BILITOT 1.2   No results for input(s): CHOL in the last 72 hours. No results for input(s): PROTIME in the last 72 hours.     Studies/Results: Ct Head Wo Contrast  Result Date: 08/21/2015 CLINICAL DATA:  Tripped and fell and chair today. Left supraorbital hematoma. Personal history of head trauma. No loss of consciousness. The patient was on the floor for 30 minutes prior to receiving help.  EXAM: CT HEAD WITHOUT CONTRAST CT MAXILLOFACIAL WITHOUT CONTRAST CT CERVICAL SPINE WITHOUT CONTRAST TECHNIQUE: Multidetector CT imaging of the head, cervical spine, and maxillofacial structures were performed using the standard protocol without intravenous contrast. Multiplanar CT image reconstructions of the cervical spine and maxillofacial structures were also generated. COMPARISON:  CT head and cervical spine 01/02/2013. FINDINGS: CT HEAD FINDINGS Moderate generalized atrophy and diffuse white matter disease is present. No acute infarct, hemorrhage, or mass lesion is present. The ventricles are proportionate to the degree of atrophy. No significant  extra-axial fluid collection is present. Extensive atherosclerotic calcifications are present throughout the cavernous internal carotid arteries vertebrobasilar system. The paranasal sinuses and mastoid air cells are clear. A left supraorbital scalp hematoma measures 2.7 x 1.6 x 1.7 cm without an underlying fracture. CT MAXILLOFACIAL FINDINGS The left supraorbital scalp hematoma is again seen. There is no underlying fracture. The paranasal sinuses and mastoid air cells are clear. Bilateral lens replacements are present. The globes and orbits are otherwise intact. There is no significant postseptal inflammation on the left. Minimal infraorbital edema is present on the left. The mandible is intact and located. Torus mandibularis are noted bilaterally. Soft tissues of the face and muscles of mastication are otherwise within normal limits. Salivary glands are unremarkable. CT CERVICAL SPINE FINDINGS The cervical spine is imaged from the skull base through T2-3. Chronic loss of disc height is present at each level from C4-5 through C7-T1. There slight degenerative anterolisthesis at C4-5. Chronic endplate changes and uncovertebral spurring is present at these levels in particular. No acute fracture traumatic subluxation is present. Osseous foraminal narrowing is most pronounced at C5-6 and C6-7 bilaterally. There is heterogeneous enlargement of the thyroid bilaterally. No dominant nodule is present. The lung apices are clear. IMPRESSION: 1. Moderate atrophy and white matter disease without acute intracranial abnormality. 2. 2.7 x 1.6 x 1.7 cm left supraorbital scalp hematoma without underlying fracture or globe injury. 3. Minimal infraorbital soft tissue swelling without other significant facial trauma. 4. Moderate multilevel spondylosis of the cervical spine without evidence for acute fracture or traumatic subluxation. Electronically Signed   By: San Morelle M.D.   On: 08/21/2015 11:58  Ct Cervical Spine Wo  Contrast  Result Date: 08/21/2015 CLINICAL DATA:  Tripped and fell and chair today. Left supraorbital hematoma. Personal history of head trauma. No loss of consciousness. The patient was on the floor for 30 minutes prior to receiving help. EXAM: CT HEAD WITHOUT CONTRAST CT MAXILLOFACIAL WITHOUT CONTRAST CT CERVICAL SPINE WITHOUT CONTRAST TECHNIQUE: Multidetector CT imaging of the head, cervical spine, and maxillofacial structures were performed using the standard protocol without intravenous contrast. Multiplanar CT image reconstructions of the cervical spine and maxillofacial structures were also generated. COMPARISON:  CT head and cervical spine 01/02/2013. FINDINGS: CT HEAD FINDINGS Moderate generalized atrophy and diffuse white matter disease is present. No acute infarct, hemorrhage, or mass lesion is present. The ventricles are proportionate to the degree of atrophy. No significant extra-axial fluid collection is present. Extensive atherosclerotic calcifications are present throughout the cavernous internal carotid arteries vertebrobasilar system. The paranasal sinuses and mastoid air cells are clear. A left supraorbital scalp hematoma measures 2.7 x 1.6 x 1.7 cm without an underlying fracture. CT MAXILLOFACIAL FINDINGS The left supraorbital scalp hematoma is again seen. There is no underlying fracture. The paranasal sinuses and mastoid air cells are clear. Bilateral lens replacements are present. The globes and orbits are otherwise intact. There is no significant postseptal inflammation on the left. Minimal infraorbital edema  is present on the left. The mandible is intact and located. Torus mandibularis are noted bilaterally. Soft tissues of the face and muscles of mastication are otherwise within normal limits. Salivary glands are unremarkable. CT CERVICAL SPINE FINDINGS The cervical spine is imaged from the skull base through T2-3. Chronic loss of disc height is present at each level from C4-5 through  C7-T1. There slight degenerative anterolisthesis at C4-5. Chronic endplate changes and uncovertebral spurring is present at these levels in particular. No acute fracture traumatic subluxation is present. Osseous foraminal narrowing is most pronounced at C5-6 and C6-7 bilaterally. There is heterogeneous enlargement of the thyroid bilaterally. No dominant nodule is present. The lung apices are clear. IMPRESSION: 1. Moderate atrophy and white matter disease without acute intracranial abnormality. 2. 2.7 x 1.6 x 1.7 cm left supraorbital scalp hematoma without underlying fracture or globe injury. 3. Minimal infraorbital soft tissue swelling without other significant facial trauma. 4. Moderate multilevel spondylosis of the cervical spine without evidence for acute fracture or traumatic subluxation. Electronically Signed   By: San Morelle M.D.   On: 08/21/2015 11:58  Dg Chest Portable 1 View  Result Date: 08/21/2015 CLINICAL DATA:  Unwitnessed fall from chair today.  Hip fracture. EXAM: PORTABLE CHEST 1 VIEW COMPARISON:  Two-view chest x-ray 01/02/2013 FINDINGS: The heart size is normal. Lung volumes remain low. Atherosclerotic changes are noted at the aortic arch. Chronic interstitial changes are stable. No acute fractures are present.  There is no pneumothorax. IMPRESSION: 1. No acute cardiopulmonary disease or significant interval change. 2. Stable chronic interstitial coarsening. 3. Atherosclerosis of the thoracic aorta. Electronically Signed   By: San Morelle M.D.   On: 08/21/2015 12:17  Dg Hip Unilat W Or Wo Pelvis 2-3 Views Left  Result Date: 08/21/2015 CLINICAL DATA:  Fall at home.  Left hip pain. EXAM: DG HIP (WITH OR WITHOUT PELVIS) 2-3V LEFT COMPARISON:  None. FINDINGS: There is a left femoral intertrochanteric fracture with mild displacement. Slight varus angulation. No subluxation or dislocation. Mild degenerative changes in the hips bilaterally. IMPRESSION: Left femoral  intertrochanteric fracture with mild displacement and varus angulation. Electronically Signed   By: Rolm Baptise M.D.   On: 08/21/2015 11:29  Dg Femur Min 2 Views Left  Result Date: 08/21/2015 CLINICAL DATA:  Unwitnessed fall. Patient complains of left hip pain. EXAM: LEFT FEMUR 2 VIEWS COMPARISON:  Hip radiograph 08/21/2015 FINDINGS: Re- demonstrated is left femoral intertrochanteric oblique fracture with mild displacement and varus angulation of the distal fracture fragment. There is generalized osteopenia. No distal femoral fractures are seen. IMPRESSION: Known left femoral intertrochanteric fracture with mild displacement and varus angulation. Osteopenia. Electronically Signed   By: Fidela Salisbury M.D.   On: 08/21/2015 13:11  Ct Maxillofacial Wo Contrast  Result Date: 08/21/2015 CLINICAL DATA:  Tripped and fell and chair today. Left supraorbital hematoma. Personal history of head trauma. No loss of consciousness. The patient was on the floor for 30 minutes prior to receiving help. EXAM: CT HEAD WITHOUT CONTRAST CT MAXILLOFACIAL WITHOUT CONTRAST CT CERVICAL SPINE WITHOUT CONTRAST TECHNIQUE: Multidetector CT imaging of the head, cervical spine, and maxillofacial structures were performed using the standard protocol without intravenous contrast. Multiplanar CT image reconstructions of the cervical spine and maxillofacial structures were also generated. COMPARISON:  CT head and cervical spine 01/02/2013. FINDINGS: CT HEAD FINDINGS Moderate generalized atrophy and diffuse white matter disease is present. No acute infarct, hemorrhage, or mass lesion is present. The ventricles are proportionate to the degree of atrophy. No significant  extra-axial fluid collection is present. Extensive atherosclerotic calcifications are present throughout the cavernous internal carotid arteries vertebrobasilar system. The paranasal sinuses and mastoid air cells are clear. A left supraorbital scalp hematoma measures 2.7 x  1.6 x 1.7 cm without an underlying fracture. CT MAXILLOFACIAL FINDINGS The left supraorbital scalp hematoma is again seen. There is no underlying fracture. The paranasal sinuses and mastoid air cells are clear. Bilateral lens replacements are present. The globes and orbits are otherwise intact. There is no significant postseptal inflammation on the left. Minimal infraorbital edema is present on the left. The mandible is intact and located. Torus mandibularis are noted bilaterally. Soft tissues of the face and muscles of mastication are otherwise within normal limits. Salivary glands are unremarkable. CT CERVICAL SPINE FINDINGS The cervical spine is imaged from the skull base through T2-3. Chronic loss of disc height is present at each level from C4-5 through C7-T1. There slight degenerative anterolisthesis at C4-5. Chronic endplate changes and uncovertebral spurring is present at these levels in particular. No acute fracture traumatic subluxation is present. Osseous foraminal narrowing is most pronounced at C5-6 and C6-7 bilaterally. There is heterogeneous enlargement of the thyroid bilaterally. No dominant nodule is present. The lung apices are clear. IMPRESSION: 1. Moderate atrophy and white matter disease without acute intracranial abnormality. 2. 2.7 x 1.6 x 1.7 cm left supraorbital scalp hematoma without underlying fracture or globe injury. 3. Minimal infraorbital soft tissue swelling without other significant facial trauma. 4. Moderate multilevel spondylosis of the cervical spine without evidence for acute fracture or traumatic subluxation. Electronically Signed   By: San Morelle M.D.   On: 08/21/2015 11:58   Medications: I have reviewed the patient's current medications. Scheduled Meds: . aspirin EC  81 mg Oral Daily  .  ceFAZolin (ANCEF) IV  2 g Intravenous 30 min Pre-Op  . donepezil  10 mg Oral QHS  . feeding supplement  1 Container Oral BID BM  . lisinopril  40 mg Oral Daily  . memantine   10 mg Oral BID  . metoprolol succinate  100 mg Oral Daily  . povidone-iodine  2 application Topical Once  . senna-docusate  1 tablet Oral Daily   Continuous Infusions: . sodium chloride 100 mL/hr at 08/22/15 0600   PRN Meds:.  Assessment/Plan: Principal Problem:   Closed left hip fracture (HCC) Active Problems:   Dementia   HTN (hypertension)   Hypokalemia   Type 2 diabetes, controlled, with neuropathy ([place])   Facial contusion   NSTEMI (non-ST elevated myocardial infarction) ([place])  NSTEMI:  EKG similar to yesterday with inf and ant lat changes from 2014.   -----tropoin 0.13-->0.16--> 0.26  -----Echo is pending -----on BB and ACE - possible add statin -----Per Dr. Wynonia Lawman "She is not a candidate for cardiac intervention due to age and comorbidities'  And per Dr. Wynonia Lawman " I would cycle some serial troponins.  If the troponin elevation remains flat then one could contemplate proceeding with surgery however the risk of general anesthesia is high.  I would obtain an echocardiogram to assess LV function.  In the meantime obtain good blood pressure control with medical therapy including beta blockers"   Dr. Stanford Breed to see for further recommendations today.  Hypokalemia resolved.  HTN BP lower now at 121/62    LOS: 1 day   Time spent with pt. :15 minutes. Cecilie Kicks  Nurse Practitioner Certified Pager [phone] or after 5pm and on weekends call [phone] 08/22/2015, 10:33 AM As above; patient seen and examined;  no chest pain or dyspnea. ECG showed anterior and inferior TWI; troponin minimally elevated but no clear trend up. Echo pending. Given age and comorbidities, patient is not a candidate for aggressive cardiac eval such as stress test or cath. She will be high risk for any procedure and I discussed this with her daughter. If procedure felt absolutely necessary then can proceed realizing she is high risk. Continue ASA and beta blocker. Kirk Ruths, MD

## 2015-08-22 NOTE — H&P (View-Only) (Signed)
ORTHOPAEDIC CONSULTATION  REQUESTING PHYSICIAN: Mariel Aloe, MD  PCP:  Jeanmarie Hubert, MD  Chief Complaint: Left intertrochanteric femur fracture  HPI: Teresa Richards is a 82 y.o. female with a history of diabetes, atrial fibrillation, and dementia who got up to go to the bathroom by herself and she was found down on the floor. She lives at [facility] in the skilled nursing facility. She normally uses a walker. She had hip pain and inability to weight-bear. She was brought to the emergency department at [facility] where x-rays revealed a displaced left intertrochanteric femur fracture. She is also found to have left eye hematoma, and CT was negative for fracture or intracranial bleed. Orthopedic consultation was placed for management of left hip fracture.  Past Medical History:  Diagnosis Date  . Atrial fibrillation ([place])   . Bradycardia   . Dementia   . Depression   . Diabetes mellitus without complication ([place])   . Fall at nursing home   . Glaucoma   . HOH (hard of hearing)   . Hypertension   . Lower back pain   . Melanoma ([place]) 1960   left shoulder skin  . Muscle pain   . Peripheral neuropathy ([place])   . TBI (traumatic brain injury) ([place])   . Umbilical hernia   . Wears glasses    Past Surgical History:  Procedure Laterality Date  . SKIN CANCER EXCISION Left 1960's   melanoma shoulder   Social History   Social History  . Marital status: Married    Spouse name: N/A  . Number of children: 1  . Years of education: 69   Occupational History  . retired Network engineer    Social History Main Topics  . Smoking status: Never Smoker  . Smokeless tobacco: Never Used  . Alcohol use No     Comment: Patient drinks coffee occasional.  . Drug use: No  . Sexual activity: No   Other Topics Concern  . None   Social History Narrative   Lives at [facility], moved to skill 11/16/14   Widowed    Has MOST form signed   DNR, POA, Living Will   Walks with walker   Exercise:  once a day does legs and arms    Never smoked   Alcohol none    Family History  Problem Relation Age of Onset  . Leukemia Father   . Heart disease Brother   . Breast cancer Sister    Allergies  Allergen Reactions  . Codeine Other (See Comments)    Per MAR   Prior to Admission medications   Medication Sig Start Date End Date Taking? Authorizing Provider  acetaminophen (TYLENOL) 325 MG tablet Take 650 mg by mouth every 4 (four) hours as needed for moderate pain.   Yes Historical Provider, MD  aspirin 81 MG tablet Take 81 mg by mouth daily.   Yes Historical Provider, MD  Calcium Polycarbophil (FIBER-CAPS PO) Take 1 capsule by mouth daily.   Yes Historical Provider, MD  donepezil (ARICEPT) 10 MG tablet Take 10 mg by mouth at bedtime.   Yes Historical Provider, MD  feeding supplement (BOOST / RESOURCE BREEZE) LIQD Take 1 Container by mouth 2 (two) times daily between meals.   Yes Historical Provider, MD  ipratropium-albuterol (DUONEB) 0.5-2.5 (3) MG/3ML SOLN Take 3 mLs by nebulization 2 (two) times daily as needed (for Shortness of breath).   Yes Historical Provider, MD  lisinopril (PRINIVIL,ZESTRIL) 40 MG tablet Take 40 mg  by mouth daily. For HTN   Yes Historical Provider, MD  memantine (NAMENDA) 10 MG tablet Take 10 mg by mouth 2 (two) times daily.   Yes Historical Provider, MD  metoprolol succinate (TOPROL-XL) 100 MG 24 hr tablet Take 100 mg by mouth daily. Take with or immediately following a meal.   Yes Historical Provider, MD  Multiple Vitamins-Minerals (CERTAVITE/ANTIOXIDANTS PO) Take 1 tablet by mouth daily.   Yes Historical Provider, MD  sennosides-docusate sodium (SENOKOT-S) 8.6-50 MG tablet Take 1 tablet by mouth daily. Hold for loose stool    Yes Historical Provider, MD   Ct Head Wo Contrast  Result Date: 08/21/2015 CLINICAL DATA:  Tripped and fell and chair today. Left supraorbital hematoma. Personal history of head trauma. No loss of consciousness. The patient was on the floor  for 30 minutes prior to receiving help. EXAM: CT HEAD WITHOUT CONTRAST CT MAXILLOFACIAL WITHOUT CONTRAST CT CERVICAL SPINE WITHOUT CONTRAST TECHNIQUE: Multidetector CT imaging of the head, cervical spine, and maxillofacial structures were performed using the standard protocol without intravenous contrast. Multiplanar CT image reconstructions of the cervical spine and maxillofacial structures were also generated. COMPARISON:  CT head and cervical spine 01/02/2013. FINDINGS: CT HEAD FINDINGS Moderate generalized atrophy and diffuse white matter disease is present. No acute infarct, hemorrhage, or mass lesion is present. The ventricles are proportionate to the degree of atrophy. No significant extra-axial fluid collection is present. Extensive atherosclerotic calcifications are present throughout the cavernous internal carotid arteries vertebrobasilar system. The paranasal sinuses and mastoid air cells are clear. A left supraorbital scalp hematoma measures 2.7 x 1.6 x 1.7 cm without an underlying fracture. CT MAXILLOFACIAL FINDINGS The left supraorbital scalp hematoma is again seen. There is no underlying fracture. The paranasal sinuses and mastoid air cells are clear. Bilateral lens replacements are present. The globes and orbits are otherwise intact. There is no significant postseptal inflammation on the left. Minimal infraorbital edema is present on the left. The mandible is intact and located. Torus mandibularis are noted bilaterally. Soft tissues of the face and muscles of mastication are otherwise within normal limits. Salivary glands are unremarkable. CT CERVICAL SPINE FINDINGS The cervical spine is imaged from the skull base through T2-3. Chronic loss of disc height is present at each level from C4-5 through C7-T1. There slight degenerative anterolisthesis at C4-5. Chronic endplate changes and uncovertebral spurring is present at these levels in particular. No acute fracture traumatic subluxation is present.  Osseous foraminal narrowing is most pronounced at C5-6 and C6-7 bilaterally. There is heterogeneous enlargement of the thyroid bilaterally. No dominant nodule is present. The lung apices are clear. IMPRESSION: 1. Moderate atrophy and white matter disease without acute intracranial abnormality. 2. 2.7 x 1.6 x 1.7 cm left supraorbital scalp hematoma without underlying fracture or globe injury. 3. Minimal infraorbital soft tissue swelling without other significant facial trauma. 4. Moderate multilevel spondylosis of the cervical spine without evidence for acute fracture or traumatic subluxation. Electronically Signed   By: San Morelle M.D.   On: 08/21/2015 11:58  Ct Cervical Spine Wo Contrast  Result Date: 08/21/2015 CLINICAL DATA:  Tripped and fell and chair today. Left supraorbital hematoma. Personal history of head trauma. No loss of consciousness. The patient was on the floor for 30 minutes prior to receiving help. EXAM: CT HEAD WITHOUT CONTRAST CT MAXILLOFACIAL WITHOUT CONTRAST CT CERVICAL SPINE WITHOUT CONTRAST TECHNIQUE: Multidetector CT imaging of the head, cervical spine, and maxillofacial structures were performed using the standard protocol without intravenous contrast. Multiplanar CT image  reconstructions of the cervical spine and maxillofacial structures were also generated. COMPARISON:  CT head and cervical spine 01/02/2013. FINDINGS: CT HEAD FINDINGS Moderate generalized atrophy and diffuse white matter disease is present. No acute infarct, hemorrhage, or mass lesion is present. The ventricles are proportionate to the degree of atrophy. No significant extra-axial fluid collection is present. Extensive atherosclerotic calcifications are present throughout the cavernous internal carotid arteries vertebrobasilar system. The paranasal sinuses and mastoid air cells are clear. A left supraorbital scalp hematoma measures 2.7 x 1.6 x 1.7 cm without an underlying fracture. CT MAXILLOFACIAL FINDINGS  The left supraorbital scalp hematoma is again seen. There is no underlying fracture. The paranasal sinuses and mastoid air cells are clear. Bilateral lens replacements are present. The globes and orbits are otherwise intact. There is no significant postseptal inflammation on the left. Minimal infraorbital edema is present on the left. The mandible is intact and located. Torus mandibularis are noted bilaterally. Soft tissues of the face and muscles of mastication are otherwise within normal limits. Salivary glands are unremarkable. CT CERVICAL SPINE FINDINGS The cervical spine is imaged from the skull base through T2-3. Chronic loss of disc height is present at each level from C4-5 through C7-T1. There slight degenerative anterolisthesis at C4-5. Chronic endplate changes and uncovertebral spurring is present at these levels in particular. No acute fracture traumatic subluxation is present. Osseous foraminal narrowing is most pronounced at C5-6 and C6-7 bilaterally. There is heterogeneous enlargement of the thyroid bilaterally. No dominant nodule is present. The lung apices are clear. IMPRESSION: 1. Moderate atrophy and white matter disease without acute intracranial abnormality. 2. 2.7 x 1.6 x 1.7 cm left supraorbital scalp hematoma without underlying fracture or globe injury. 3. Minimal infraorbital soft tissue swelling without other significant facial trauma. 4. Moderate multilevel spondylosis of the cervical spine without evidence for acute fracture or traumatic subluxation. Electronically Signed   By: San Morelle M.D.   On: 08/21/2015 11:58  Dg Chest Portable 1 View  Result Date: 08/21/2015 CLINICAL DATA:  Unwitnessed fall from chair today.  Hip fracture. EXAM: PORTABLE CHEST 1 VIEW COMPARISON:  Two-view chest x-ray 01/02/2013 FINDINGS: The heart size is normal. Lung volumes remain low. Atherosclerotic changes are noted at the aortic arch. Chronic interstitial changes are stable. No acute fractures  are present.  There is no pneumothorax. IMPRESSION: 1. No acute cardiopulmonary disease or significant interval change. 2. Stable chronic interstitial coarsening. 3. Atherosclerosis of the thoracic aorta. Electronically Signed   By: San Morelle M.D.   On: 08/21/2015 12:17  Dg Hip Unilat W Or Wo Pelvis 2-3 Views Left  Result Date: 08/21/2015 CLINICAL DATA:  Fall at home.  Left hip pain. EXAM: DG HIP (WITH OR WITHOUT PELVIS) 2-3V LEFT COMPARISON:  None. FINDINGS: There is a left femoral intertrochanteric fracture with mild displacement. Slight varus angulation. No subluxation or dislocation. Mild degenerative changes in the hips bilaterally. IMPRESSION: Left femoral intertrochanteric fracture with mild displacement and varus angulation. Electronically Signed   By: Rolm Baptise M.D.   On: 08/21/2015 11:29  Dg Femur Min 2 Views Left  Result Date: 08/21/2015 CLINICAL DATA:  Unwitnessed fall. Patient complains of left hip pain. EXAM: LEFT FEMUR 2 VIEWS COMPARISON:  Hip radiograph 08/21/2015 FINDINGS: Re- demonstrated is left femoral intertrochanteric oblique fracture with mild displacement and varus angulation of the distal fracture fragment. There is generalized osteopenia. No distal femoral fractures are seen. IMPRESSION: Known left femoral intertrochanteric fracture with mild displacement and varus angulation. Osteopenia. Electronically Signed  By: Fidela Salisbury M.D.   On: 08/21/2015 13:11  Ct Maxillofacial Wo Contrast  Result Date: 08/21/2015 CLINICAL DATA:  Tripped and fell and chair today. Left supraorbital hematoma. Personal history of head trauma. No loss of consciousness. The patient was on the floor for 30 minutes prior to receiving help. EXAM: CT HEAD WITHOUT CONTRAST CT MAXILLOFACIAL WITHOUT CONTRAST CT CERVICAL SPINE WITHOUT CONTRAST TECHNIQUE: Multidetector CT imaging of the head, cervical spine, and maxillofacial structures were performed using the standard protocol without  intravenous contrast. Multiplanar CT image reconstructions of the cervical spine and maxillofacial structures were also generated. COMPARISON:  CT head and cervical spine 01/02/2013. FINDINGS: CT HEAD FINDINGS Moderate generalized atrophy and diffuse white matter disease is present. No acute infarct, hemorrhage, or mass lesion is present. The ventricles are proportionate to the degree of atrophy. No significant extra-axial fluid collection is present. Extensive atherosclerotic calcifications are present throughout the cavernous internal carotid arteries vertebrobasilar system. The paranasal sinuses and mastoid air cells are clear. A left supraorbital scalp hematoma measures 2.7 x 1.6 x 1.7 cm without an underlying fracture. CT MAXILLOFACIAL FINDINGS The left supraorbital scalp hematoma is again seen. There is no underlying fracture. The paranasal sinuses and mastoid air cells are clear. Bilateral lens replacements are present. The globes and orbits are otherwise intact. There is no significant postseptal inflammation on the left. Minimal infraorbital edema is present on the left. The mandible is intact and located. Torus mandibularis are noted bilaterally. Soft tissues of the face and muscles of mastication are otherwise within normal limits. Salivary glands are unremarkable. CT CERVICAL SPINE FINDINGS The cervical spine is imaged from the skull base through T2-3. Chronic loss of disc height is present at each level from C4-5 through C7-T1. There slight degenerative anterolisthesis at C4-5. Chronic endplate changes and uncovertebral spurring is present at these levels in particular. No acute fracture traumatic subluxation is present. Osseous foraminal narrowing is most pronounced at C5-6 and C6-7 bilaterally. There is heterogeneous enlargement of the thyroid bilaterally. No dominant nodule is present. The lung apices are clear. IMPRESSION: 1. Moderate atrophy and white matter disease without acute intracranial  abnormality. 2. 2.7 x 1.6 x 1.7 cm left supraorbital scalp hematoma without underlying fracture or globe injury. 3. Minimal infraorbital soft tissue swelling without other significant facial trauma. 4. Moderate multilevel spondylosis of the cervical spine without evidence for acute fracture or traumatic subluxation. Electronically Signed   By: San Morelle M.D.   On: 08/21/2015 11:58   Positive ROS: All other systems have been reviewed and were otherwise negative with the exception of those mentioned in the HPI and as above.  Physical Exam: General: Alert, no acute distress. She does have left periorbital hematoma. Cardiovascular: No pedal edema Respiratory: No cyanosis, no use of accessory musculature GI: No organomegaly, abdomen is soft and non-tender Skin: No lesions in the area of chief complaint Neurologic: Sensation intact distally Psychiatric: Patient is competent for consent with normal mood and affect Lymphatic: No axillary or cervical lymphadenopathy  MUSCULOSKELETAL:  BUE/RLE: No trauma. No skin wounds or lesions. No tenderness to palpation. Full painless range of motion.  Left lower extremity: Shortened and externally rotated. No skin wounds or lesions. Palpable pulse present. Positive motor function dorsiflexion, plantarflexion, and great toe extension with strength limited by pain. She reports intact sensation.  Assessment: Multiple medical problems including dementia  Displaced left intertrochanteric femur fracture  Plan: I discussed the findings with the patient and her daughter who is the power of  attorney. We discussed intramedullary fixation of her left intertrochanteric femur fracture to allow for pain control and early mobilization. We discussed the risks, benefits, and alternatives. They understand that she unfortunately has about a 30% risk of 90 day mortality. We will plan for surgery later this evening. All questions were solicited and answered.  The  risks, benefits, and alternatives were discussed with the patient. There are risks associated with the surgery including, but not limited to, problems with anesthesia (death), infection, differences in leg length/angulation/rotation, fracture of bones, loosening or failure of implants, malunion, nonunion, hematoma (blood accumulation) which may require surgical drainage, blood clots, pulmonary embolism, nerve injury (foot drop), and blood vessel injury. The patient understands these risks and elects to proceed.     Avanti Jetter, Horald Pollen, MD Cell [phone]    08/21/2015 5:22 PM

## 2015-08-22 NOTE — Op Note (Signed)
NAME:  Teresa Richards NO.:  [id number]  MEDICAL RECORD NO.:  [id number]  LOCATION:  [facility]                         FACILITY:  [facility]  PHYSICIAN:  Rod Can, MD     DATE OF BIRTH:  Jul 05, 1918  DATE OF PROCEDURE:  08/22/2015 DATE OF DISCHARGE:                              OPERATIVE REPORT   PREOPERATIVE DIAGNOSIS:  Left intertrochanteric femur fracture with subtrochanteric extension.  POSTOPERATIVE DIAGNOSIS:  Left intertrochanteric femur fracture with subtrochanteric extension.  PROCEDURE PERFORMED:  Intramedullary fixation of left pertrochanteric femur fracture.  IMPLANTS: 1. Synthes TFNA nail size 10 x 360 mm, 130 degrees. 2. 95 mm TFNA lag screw. 3. 5.0 mm distal interlocking screw x1.  ANESTHESIA:  Spinal.  ANTIBIOTICS:  2 g Ancef.  EBL:  100 mL.  COMPLICATIONS:  None.  TUBES AND DRAINS:  None.  DISPOSITION:  Stable to PACU.  INDICATIONS:  The patient is a 82 year old female with dementia, who is a nursing home resident.  She ambulates with a walker.  She had a fall at her nursing home yesterday.  She had hip pain and inability to weight bear.  She was brought to the emergency department and found to have a left intertrochanteric femur fracture.  She was admitted to the Hospitalist Service.  Preoperative EKG revealed acute changes when compared with previous EKG.  Troponins were obtained that showed mild elevation.  Cardiology consultation was obtained for her non-ST elevation MI.  She was deemed not a candidate for cardiac catheterization.  She was declared high risk for surgery.  I discussed the risks, benefits, and alternatives of surgical fixation of her left intertrochanteric femur fracture with the patient's daughter, who is her power of attorney.  She understands that this is a high risk situation, and she did elect to proceed.  DESCRIPTION OF PROCEDURE IN DETAIL:  I identified the patient in the holding area using 2 identifiers.   The surgical site was marked by myself.  She was taken to the operating room, flipped in the lateral decubitus position.  Anesthesia placed a spinal without any difficulty. She was then transferred to the [facility] table.  The right lower extremity was scissored underneath the left.  I reduced the fracture with standard traction, internal rotation, adduction.  The left hip was prepped and draped in normal sterile surgical fashion.  Time-out was called verifying site and side of surgery.  I used fluoroscopy to define her anatomy.  I made a standard 4 cm incision proximal to the tip of the trochanter.  I used the awl to determine the standard starting point for trochanteric entry nail.  I placed a guide pin under live AP and lateral fluoroscopic guidance.  I then used the entry reamer.  I passed a guidewire to the physeal scar of the knee.  I reamed to an 11.5 mm with excellent chatter.  I measured the length of the nail.  I passed the real nail, it was impacted to the appropriate position.  I made a separate stab incision, inserted the cannula down to the bone for the cephalomedullary device.  I inserted the guide pin under live fluoroscopic AP and lateral control.  I  then measured the length of the pin.  I reamed and then I placed the real screw.  I tightened the set screw.  I then removed the jig.  Using perfect circle technique, I placed 1 distal interlocking screw.  I then obtained final AP and lateral fluoroscopy views, the fracture was in near anatomic alignment. Tip-apex distance was appropriate.  There was no chondral penetration. The wounds were copiously irrigated with saline.  I closed the wounds in layers with #1 Vicryl for the fascia, 2-0 Monocryl for the deep dermal layer, followed by 3-0 running Monocryl subcuticular stitch.  Glue was applied to the skin.  Once the glue was dried, sterile dressings were applied.  The patient was aroused from anesthesia, taken to PACU in stable  condition.  Sponge, needle, and instrument counts were correct at the end of the case x2.  There were no known complications.  We will readmit the patient to the hospitalist.  She may weight bear as tolerated with a walker.  We will place her on aspirin for DVT prophylaxis.  She will work with physical therapy.  She will need disposition planning.  I will see her in the office 2 weeks after discharge.          ______________________________ Rod Can, MD     BS/MEDQ  D:  08/22/2015  T:  08/22/2015  Job:  [phone] [id number]

## 2015-08-22 NOTE — Anesthesia Procedure Notes (Signed)
Spinal

## 2015-08-22 NOTE — Interval H&P Note (Signed)
History and Physical Interval Note:  08/22/2015 8:38 PM  Teresa Richards  has presented today for surgery, with the diagnosis of Left Hips Fx  The various methods of treatment have been discussed with the patient and family. After consideration of risks, benefits and other options for treatment, the patient has consented to  Procedure(s): INTRAMEDULLARY (IM) NAIL INTERTROCHANTRIC (Left) as a surgical intervention .  The patient's history has been reviewed, patient examined, no change in status, stable for surgery.  I have reviewed the patient's chart and labs.  Questions were answered to the patient's satisfaction.    I had a lengthy discussion with the patient's daughter, who is the POA. The patient has unfortunately had a NSTEMI. She has been evaluated by cardiology and deemed not to be a candidate for a cardiac catheterization. We have discussed that this is a very difficult situation. She understands that the patient may not have a good outcome either with operative or nonoperative treatment. In light of all of this, surgical fixation would still be reasonable for pain control and to allow mobilization.   Schylar Allard, Horald Pollen

## 2015-08-22 NOTE — Anesthesia Postprocedure Evaluation (Signed)
Anesthesia Post Note  Patient: Teresa Richards  Procedure(s) Performed: Procedure(s) (LRB): INTRAMEDULLARY (IM) NAIL INTERTROCHANTRIC (Left)  Patient location during evaluation: PACU Anesthesia Type: Spinal Level of consciousness: oriented and awake and alert Pain management: pain level controlled Vital Signs Assessment: post-procedure vital signs reviewed and stable Respiratory status: spontaneous breathing, respiratory function stable and patient connected to nasal cannula oxygen Cardiovascular status: blood pressure returned to baseline and stable Postop Assessment: no headache and no backache Anesthetic complications: no    Last Vitals:  Vitals:   08/22/15 1833 08/22/15 2300  BP: (!) 159/68 115/86  Pulse: 73 (!) 56  Resp: 17 15  Temp: 37.9 C     Last Pain:  Vitals:   08/22/15 1833  TempSrc: Oral  PainSc:                  Adaijah Endres,JAMES TERRILL

## 2015-08-22 NOTE — Progress Notes (Signed)
BP 168/80 and troponin elevated. Dr. Lonny Prude notified. Orders given to administer BP medicine early.

## 2015-08-22 NOTE — Anesthesia Preprocedure Evaluation (Addendum)
Anesthesia Evaluation  Patient identified by MRN, date of birth, ID band Patient confused  General Assessment Comment:Bilateral ecchymoses of face  Reviewed: Allergy & Precautions, Unable to perform ROS - Chart review only  Airway Mallampati: II  TM Distance: >3 FB Neck ROM: Limited    Dental  (+) Teeth Intact   Pulmonary    breath sounds clear to auscultation       Cardiovascular hypertension, + Past MI  + Valvular Problems/Murmurs  Rhythm:Irregular Rate:Tachycardia  Cardiology notes, labs, and EKG noted. ECHO noted   Neuro/Psych  Neuromuscular disease CVA    GI/Hepatic negative GI ROS, Neg liver ROS,   Endo/Other  diabetes  Renal/GU negative Renal ROS     Musculoskeletal   Abdominal   Peds  Hematology  (+) anemia ,   Anesthesia Other Findings   Reproductive/Obstetrics                            Anesthesia Physical Anesthesia Plan  ASA: IV  Anesthesia Plan: Spinal   Post-op Pain Management:    Induction: Intravenous  Airway Management Planned: Natural Airway and Simple Face Mask  Additional Equipment:   Intra-op Plan:   Post-operative Plan:   Informed Consent: I have reviewed the patients History and Physical, chart, labs and discussed the procedure including the risks, benefits and alternatives for the proposed anesthesia with the patient or authorized representative who has indicated his/her understanding and acceptance.     Plan Discussed with: CRNA  Anesthesia Plan Comments: (Talked c daughter about anesthesia plan)       Anesthesia Quick Evaluation

## 2015-08-22 NOTE — Progress Notes (Signed)
PROGRESS NOTE    Teresa Richards  [id number] DOB: [birth year] DOA: 08/21/2015 PCP: Jeanmarie Hubert, MD (Confirm with patient/family/NH records and if not entered, this HAS to be entered at [facility] point of entry. "No PCP" if truly none.)   Brief Narrative: (Start on day 1 of progress note - keep it brief and live) Patient is a 82 year old female that presented from SNF after suffering an unwitnessed fall. Patient suffered trauma to her left face and left hip. Unknown amount of time down, however possibly one hour. Patient was found to have a left femur fracture and no evidence of intracranial bleed or facial fracture. Orthopedic surgery was consulted for surgery however patient was found to have a possible active and STEMI.   Assessment & Plan:   Principal Problem:   Closed left hip fracture (HCC) Active Problems:   Dementia   HTN (hypertension)   Hypokalemia   Type 2 diabetes, controlled, with neuropathy (HCC)   Facial contusion   NSTEMI (non-ST elevated myocardial infarction) ([place])  Left hip fracture, closed Secondary to fall while ambulating with a walker. Perioperative risk of <1% for MACE however, current NSTEMI. She is higher risk for orthopedic surgical management of her hip fracture. Family wishes for surgery, however, with high risk nature, will need to discuss risks vs benefits. -cardiology consulted and will see tonight. -NPO after midnight -Ortho held surgery until tomorrow  Nstemi Elevated troponin and new ST-T changes (t-wave inversion) on EKG. No chest pain, however feel there is an equivalent with history of unwitnessed fall and patient's slightly altered mental status (although she's mildly demented at baseline). Attempted to contact patient's family regarding direction for management as she is DNR -cardiology saw yesterday and will follow. Troponin trending up and EKG appears similar to yesterday -BP control with home meds -Pain management as needed -follow-up with  family for discussion regarding plan of care -follow-up echo  Left sided facial contusion No fracture. Large hematoma -wound care  Hypokalemia Resolved with repletion. Would like to keep >4 -add magnesium  Hypertension -continue lisinopril 40mg  daily -continue metoprolol succinate 100mg  daily  Dementia -continue donepezil 10mg  qhs  Diabetes mellitus, type 2, not on insulin - SSI sensitive   DVT prophylaxis: SCDs Code Status: SCDs Family Communication: Hulda Marin (Daughter); call made with no answer Disposition Plan: Pending discussion with family.    Consultants:   Orthopedic surgery (Dr. Lyla Glassing)  Cardiology [facility])  Procedures:   Echo pending  Possible fixation of left femur fracture  Antimicrobials:  None    Subjective: Patient reports no new complaints. She has mild pain over her face and leg, but reports she is feeling well overall  Objective: Vitals:   08/22/15 0500 08/22/15 0613 08/22/15 0737 08/22/15 0854  BP: (!) 172/76 (!) 168/80 (!) 175/76 121/62  Pulse: 79  83 67  Resp: 16     Temp: 98.6 F (37 C)     TempSrc: Oral     SpO2: 96%     Weight:      Height:        Intake/Output Summary (Last 24 hours) at 08/22/15 0919 Last data filed at 08/22/15 0904  Gross per 24 hour  Intake             1423 ml  Output              550 ml  Net              873 ml   [employer]  08/21/15 1037  Weight: 54 kg (119 lb)    Examination:  General exam: Appears calm and comfortable  Respiratory system: Clear to auscultation. Respiratory effort normal. Cardiovascular system: S1 & S2 heard, RRR. 2/6 systolic murmur. No JVD, rubs, gallops or clicks. No pedal edema. Gastrointestinal system: Abdomen is nondistended, soft and nontender. No organomegaly or masses felt. Normal bowel sounds heard. Central nervous system: Alert and oriented. No focal neurological deficits. Extremities: 4/5 upper extremity strength. 3/5 lower extremity strength. Skin:  Hematoma over left eye Psychiatry: Mood & affect appropriate. Oriented to person and place.    Data Reviewed: I have personally reviewed following labs and imaging studies  CBC:  Recent Labs Lab 08/21/15 1209 08/22/15 0429  WBC 12.0* 8.0  NEUTROABS 10.6* 5.4  HGB 13.9 11.5*  HCT 43.0 34.6*  MCV 84.5 83.6  PLT 177 0000000   Basic Metabolic Panel:  Recent Labs Lab 08/21/15 1209 08/22/15 0429  NA 141 137  K 3.1* 4.3  CL 105 107  CO2 26 23  GLUCOSE 137* 105*  BUN 11 13  CREATININE 0.60 0.66  CALCIUM 9.4 8.5*   GFR: Estimated Creatinine Clearance: 32.4 mL/min (by C-G formula based on SCr of 0.8 mg/dL). Liver Function Tests:  Recent Labs Lab 08/21/15 1209 08/22/15 0429  AST 20 15  ALT 17 13*  ALKPHOS 66 51  BILITOT 0.7 1.2  PROT 7.2 5.9*  ALBUMIN 4.3 3.6   No results for input(s): LIPASE, AMYLASE in the last 168 hours. No results for input(s): AMMONIA in the last 168 hours. Coagulation Profile: No results for input(s): INR, PROTIME in the last 168 hours. Cardiac Enzymes:  Recent Labs Lab 08/21/15 1827 08/21/15 2257 08/22/15 0429  TROPONINI 0.13* 0.16* 0.26*   BNP (last 3 results) No results for input(s): PROBNP in the last 8760 hours. HbA1C: No results for input(s): HGBA1C in the last 72 hours. CBG: No results for input(s): GLUCAP in the last 168 hours. Lipid Profile: No results for input(s): CHOL, HDL, LDLCALC, TRIG, CHOLHDL, LDLDIRECT in the last 72 hours. Thyroid Function Tests: No results for input(s): TSH, T4TOTAL, FREET4, T3FREE, THYROIDAB in the last 72 hours. Anemia Panel: No results for input(s): VITAMINB12, FOLATE, FERRITIN, TIBC, IRON, RETICCTPCT in the last 72 hours. Sepsis Labs: No results for input(s): PROCALCITON, LATICACIDVEN in the last 168 hours.  Recent Results (from the past 240 hour(s))  Surgical pcr screen     Status: None   Collection Time: 08/21/15  4:11 PM  Result Value Ref Range Status   MRSA, PCR NEGATIVE NEGATIVE  Final   Staphylococcus aureus NEGATIVE NEGATIVE Final    Comment:        The Xpert SA Assay (FDA approved for NASAL specimens in patients over 34 years of age), is one component of a comprehensive surveillance program.  Test performance has been validated by [facility] for patients greater than or equal to 1 year old. It is not intended to diagnose infection nor to guide or monitor treatment.          Radiology Studies: Ct Head Wo Contrast  Result Date: 08/21/2015 CLINICAL DATA:  Tripped and fell and chair today. Left supraorbital hematoma. Personal history of head trauma. No loss of consciousness. The patient was on the floor for 30 minutes prior to receiving help. EXAM: CT HEAD WITHOUT CONTRAST CT MAXILLOFACIAL WITHOUT CONTRAST CT CERVICAL SPINE WITHOUT CONTRAST TECHNIQUE: Multidetector CT imaging of the head, cervical spine, and maxillofacial structures were performed using the standard protocol without intravenous  contrast. Multiplanar CT image reconstructions of the cervical spine and maxillofacial structures were also generated. COMPARISON:  CT head and cervical spine 01/02/2013. FINDINGS: CT HEAD FINDINGS Moderate generalized atrophy and diffuse white matter disease is present. No acute infarct, hemorrhage, or mass lesion is present. The ventricles are proportionate to the degree of atrophy. No significant extra-axial fluid collection is present. Extensive atherosclerotic calcifications are present throughout the cavernous internal carotid arteries vertebrobasilar system. The paranasal sinuses and mastoid air cells are clear. A left supraorbital scalp hematoma measures 2.7 x 1.6 x 1.7 cm without an underlying fracture. CT MAXILLOFACIAL FINDINGS The left supraorbital scalp hematoma is again seen. There is no underlying fracture. The paranasal sinuses and mastoid air cells are clear. Bilateral lens replacements are present. The globes and orbits are otherwise intact. There is no  significant postseptal inflammation on the left. Minimal infraorbital edema is present on the left. The mandible is intact and located. Torus mandibularis are noted bilaterally. Soft tissues of the face and muscles of mastication are otherwise within normal limits. Salivary glands are unremarkable. CT CERVICAL SPINE FINDINGS The cervical spine is imaged from the skull base through T2-3. Chronic loss of disc height is present at each level from C4-5 through C7-T1. There slight degenerative anterolisthesis at C4-5. Chronic endplate changes and uncovertebral spurring is present at these levels in particular. No acute fracture traumatic subluxation is present. Osseous foraminal narrowing is most pronounced at C5-6 and C6-7 bilaterally. There is heterogeneous enlargement of the thyroid bilaterally. No dominant nodule is present. The lung apices are clear. IMPRESSION: 1. Moderate atrophy and white matter disease without acute intracranial abnormality. 2. 2.7 x 1.6 x 1.7 cm left supraorbital scalp hematoma without underlying fracture or globe injury. 3. Minimal infraorbital soft tissue swelling without other significant facial trauma. 4. Moderate multilevel spondylosis of the cervical spine without evidence for acute fracture or traumatic subluxation. Electronically Signed   By: San Morelle M.D.   On: 08/21/2015 11:58  Ct Cervical Spine Wo Contrast  Result Date: 08/21/2015 CLINICAL DATA:  Tripped and fell and chair today. Left supraorbital hematoma. Personal history of head trauma. No loss of consciousness. The patient was on the floor for 30 minutes prior to receiving help. EXAM: CT HEAD WITHOUT CONTRAST CT MAXILLOFACIAL WITHOUT CONTRAST CT CERVICAL SPINE WITHOUT CONTRAST TECHNIQUE: Multidetector CT imaging of the head, cervical spine, and maxillofacial structures were performed using the standard protocol without intravenous contrast. Multiplanar CT image reconstructions of the cervical spine and  maxillofacial structures were also generated. COMPARISON:  CT head and cervical spine 01/02/2013. FINDINGS: CT HEAD FINDINGS Moderate generalized atrophy and diffuse white matter disease is present. No acute infarct, hemorrhage, or mass lesion is present. The ventricles are proportionate to the degree of atrophy. No significant extra-axial fluid collection is present. Extensive atherosclerotic calcifications are present throughout the cavernous internal carotid arteries vertebrobasilar system. The paranasal sinuses and mastoid air cells are clear. A left supraorbital scalp hematoma measures 2.7 x 1.6 x 1.7 cm without an underlying fracture. CT MAXILLOFACIAL FINDINGS The left supraorbital scalp hematoma is again seen. There is no underlying fracture. The paranasal sinuses and mastoid air cells are clear. Bilateral lens replacements are present. The globes and orbits are otherwise intact. There is no significant postseptal inflammation on the left. Minimal infraorbital edema is present on the left. The mandible is intact and located. Torus mandibularis are noted bilaterally. Soft tissues of the face and muscles of mastication are otherwise within normal limits. Salivary glands are unremarkable.  CT CERVICAL SPINE FINDINGS The cervical spine is imaged from the skull base through T2-3. Chronic loss of disc height is present at each level from C4-5 through C7-T1. There slight degenerative anterolisthesis at C4-5. Chronic endplate changes and uncovertebral spurring is present at these levels in particular. No acute fracture traumatic subluxation is present. Osseous foraminal narrowing is most pronounced at C5-6 and C6-7 bilaterally. There is heterogeneous enlargement of the thyroid bilaterally. No dominant nodule is present. The lung apices are clear. IMPRESSION: 1. Moderate atrophy and white matter disease without acute intracranial abnormality. 2. 2.7 x 1.6 x 1.7 cm left supraorbital scalp hematoma without underlying  fracture or globe injury. 3. Minimal infraorbital soft tissue swelling without other significant facial trauma. 4. Moderate multilevel spondylosis of the cervical spine without evidence for acute fracture or traumatic subluxation. Electronically Signed   By: San Morelle M.D.   On: 08/21/2015 11:58  Dg Chest Portable 1 View  Result Date: 08/21/2015 CLINICAL DATA:  Unwitnessed fall from chair today.  Hip fracture. EXAM: PORTABLE CHEST 1 VIEW COMPARISON:  Two-view chest x-ray 01/02/2013 FINDINGS: The heart size is normal. Lung volumes remain low. Atherosclerotic changes are noted at the aortic arch. Chronic interstitial changes are stable. No acute fractures are present.  There is no pneumothorax. IMPRESSION: 1. No acute cardiopulmonary disease or significant interval change. 2. Stable chronic interstitial coarsening. 3. Atherosclerosis of the thoracic aorta. Electronically Signed   By: San Morelle M.D.   On: 08/21/2015 12:17  Dg Hip Unilat W Or Wo Pelvis 2-3 Views Left  Result Date: 08/21/2015 CLINICAL DATA:  Fall at home.  Left hip pain. EXAM: DG HIP (WITH OR WITHOUT PELVIS) 2-3V LEFT COMPARISON:  None. FINDINGS: There is a left femoral intertrochanteric fracture with mild displacement. Slight varus angulation. No subluxation or dislocation. Mild degenerative changes in the hips bilaterally. IMPRESSION: Left femoral intertrochanteric fracture with mild displacement and varus angulation. Electronically Signed   By: Rolm Baptise M.D.   On: 08/21/2015 11:29  Dg Femur Min 2 Views Left  Result Date: 08/21/2015 CLINICAL DATA:  Unwitnessed fall. Patient complains of left hip pain. EXAM: LEFT FEMUR 2 VIEWS COMPARISON:  Hip radiograph 08/21/2015 FINDINGS: Re- demonstrated is left femoral intertrochanteric oblique fracture with mild displacement and varus angulation of the distal fracture fragment. There is generalized osteopenia. No distal femoral fractures are seen. IMPRESSION: Known left  femoral intertrochanteric fracture with mild displacement and varus angulation. Osteopenia. Electronically Signed   By: Fidela Salisbury M.D.   On: 08/21/2015 13:11  Ct Maxillofacial Wo Contrast  Result Date: 08/21/2015 CLINICAL DATA:  Tripped and fell and chair today. Left supraorbital hematoma. Personal history of head trauma. No loss of consciousness. The patient was on the floor for 30 minutes prior to receiving help. EXAM: CT HEAD WITHOUT CONTRAST CT MAXILLOFACIAL WITHOUT CONTRAST CT CERVICAL SPINE WITHOUT CONTRAST TECHNIQUE: Multidetector CT imaging of the head, cervical spine, and maxillofacial structures were performed using the standard protocol without intravenous contrast. Multiplanar CT image reconstructions of the cervical spine and maxillofacial structures were also generated. COMPARISON:  CT head and cervical spine 01/02/2013. FINDINGS: CT HEAD FINDINGS Moderate generalized atrophy and diffuse white matter disease is present. No acute infarct, hemorrhage, or mass lesion is present. The ventricles are proportionate to the degree of atrophy. No significant extra-axial fluid collection is present. Extensive atherosclerotic calcifications are present throughout the cavernous internal carotid arteries vertebrobasilar system. The paranasal sinuses and mastoid air cells are clear. A left supraorbital scalp hematoma measures 2.7  x 1.6 x 1.7 cm without an underlying fracture. CT MAXILLOFACIAL FINDINGS The left supraorbital scalp hematoma is again seen. There is no underlying fracture. The paranasal sinuses and mastoid air cells are clear. Bilateral lens replacements are present. The globes and orbits are otherwise intact. There is no significant postseptal inflammation on the left. Minimal infraorbital edema is present on the left. The mandible is intact and located. Torus mandibularis are noted bilaterally. Soft tissues of the face and muscles of mastication are otherwise within normal limits. Salivary  glands are unremarkable. CT CERVICAL SPINE FINDINGS The cervical spine is imaged from the skull base through T2-3. Chronic loss of disc height is present at each level from C4-5 through C7-T1. There slight degenerative anterolisthesis at C4-5. Chronic endplate changes and uncovertebral spurring is present at these levels in particular. No acute fracture traumatic subluxation is present. Osseous foraminal narrowing is most pronounced at C5-6 and C6-7 bilaterally. There is heterogeneous enlargement of the thyroid bilaterally. No dominant nodule is present. The lung apices are clear. IMPRESSION: 1. Moderate atrophy and white matter disease without acute intracranial abnormality. 2. 2.7 x 1.6 x 1.7 cm left supraorbital scalp hematoma without underlying fracture or globe injury. 3. Minimal infraorbital soft tissue swelling without other significant facial trauma. 4. Moderate multilevel spondylosis of the cervical spine without evidence for acute fracture or traumatic subluxation. Electronically Signed   By: San Morelle M.D.   On: 08/21/2015 11:58       Scheduled Meds: . aspirin EC  81 mg Oral Daily  .  ceFAZolin (ANCEF) IV  2 g Intravenous 30 min Pre-Op  . donepezil  10 mg Oral QHS  . feeding supplement  1 Container Oral BID BM  . lisinopril  40 mg Oral Daily  . memantine  10 mg Oral BID  . metoprolol succinate  100 mg Oral Daily  . povidone-iodine  2 application Topical Once  . senna-docusate  1 tablet Oral Daily   Continuous Infusions: . sodium chloride 100 mL/hr at 08/22/15 0600     LOS: 1 day    Time spent: 25 minutes    Cordelia Poche, MD [facility] Pager [phone]  If 7PM-7AM, please contact night-coverage [email or web address] Password [facility] 08/22/2015, 9:19 AM

## 2015-08-22 NOTE — Progress Notes (Signed)
Notified Dr. Cherene Altes that patient has a bite of pot roast and drink of liquid at 1400. Per Dr. Cherene Altes patient needs to wait 6 hours. Notified Amy, RN at [facility] and OR. Per [facility] OR I contact Amy and requested that patient be transferred to a room at [facility].

## 2015-08-22 NOTE — Brief Op Note (Signed)
08/22/2015  10:26 PM  PATIENT:  Teresa Richards  82 y.o. female  PRE-OPERATIVE DIAGNOSIS:  Left Hip Fx  POST-OPERATIVE DIAGNOSIS:  Left Hip Fx  PROCEDURE:  Procedure(s): INTRAMEDULLARY (IM) NAIL INTERTROCHANTRIC (Left)  SURGEON:  Surgeon(s) and Role:    * Rod Can, MD - Primary  PHYSICIAN ASSISTANT:   ASSISTANTS: none   ANESTHESIA:   spinal  EBL:  Total I/O In: 250 [IV Piggyback:250] Out: 300 [Urine:200; Blood:100]  BLOOD ADMINISTERED:none  DRAINS: none   LOCAL MEDICATIONS USED:  NONE  SPECIMEN:  No Specimen  DISPOSITION OF SPECIMEN:  N/A  COUNTS:  YES  TOURNIQUET:  * No tourniquets in log *  DICTATION: .Other Dictation: Dictation Number # not provided by telephone system  PLAN OF CARE: Admit to inpatient   PATIENT DISPOSITION:  PACU - hemodynamically stable.   Delay start of Pharmacological VTE agent (>24hrs) due to surgical blood loss or risk of bleeding: no

## 2015-08-22 NOTE — Progress Notes (Signed)
*  PRELIMINARY RESULTS* Echocardiogram 2D Echocardiogram has been performed.  Leavy Cella 08/22/2015, 3:00 PM

## 2015-08-22 NOTE — Progress Notes (Signed)
Manual BP 168/80. MD notified. Awaiting cb.

## 2015-08-22 NOTE — Transfer of Care (Signed)
Immediate Anesthesia Transfer of Care Note  Patient: Teresa Richards  Procedure(s) Performed: Procedure(s): INTRAMEDULLARY (IM) NAIL INTERTROCHANTRIC (Left)  Patient Location: PACU  Anesthesia Type:Spinal  Level of Consciousness: awake  Airway & Oxygen Therapy: Patient Spontanous Breathing and Patient connected to nasal cannula oxygen  Post-op Assessment: Report given to RN and Post -op Vital signs reviewed and stable  Post vital signs: Reviewed and stable  Last Vitals:  Vitals:   08/22/15 1434 08/22/15 1833  BP: (!) 162/66 (!) 159/68  Pulse: 70 73  Resp:  17  Temp: 36.6 C 37.9 C    Last Pain:  Vitals:   08/22/15 1833  TempSrc: Oral  PainSc:          Complications: No apparent anesthesia complications and Patient transferred to PACU with neo drip; Dr. Orene Desanctis aware

## 2015-08-22 NOTE — Anesthesia Procedure Notes (Signed)
Spinal  Patient location during procedure: OR Start time: 08/22/2015 9:05 PM End time: 08/22/2015 9:24 PM Staffing Anesthesiologist: Rica Koyanagi Performed: anesthesiologist  Preanesthetic Checklist Completed: patient identified, site marked, surgical consent, pre-op evaluation, timeout performed, IV checked, risks and benefits discussed and monitors and equipment checked Spinal Block Patient position: left lateral decubitus Prep: ChloraPrep Patient monitoring: heart rate, cardiac monitor, continuous pulse ox and blood pressure Approach: left paramedian Location: L3-4 Injection technique: single-shot Needle Needle type: Quincke  Needle gauge: 25 G Needle length: 9 cm Needle insertion depth: 5 cm Assessment Sensory level: T6 Additional Notes Tolerated well

## 2015-08-22 NOTE — Progress Notes (Signed)
Initial Nutrition Assessment  DOCUMENTATION CODES:   Non-severe (moderate) malnutrition in context of chronic illness  INTERVENTION:  -Ensure Enlive po BID, each supplement provides 350 kcal and 20 grams of protein -RD to continue to monitor  NUTRITION DIAGNOSIS:   Malnutrition related to chronic illness as evidenced by moderate depletion of body fat, moderate depletions of muscle mass.  GOAL:   Patient will meet greater than or equal to 90% of their needs  MONITOR:   PO intake, I & O's, Labs, Weight trends, Skin  REASON FOR ASSESSMENT:   Malnutrition Screening Tool    ASSESSMENT:   Teresa Richards is a 82 y.o. female with medical history significant of hypertension, diabetes, dementia. Patient presented to the [facility] Emergency department for evaluation after suffering a fall. She reports that she was using her walker to ambulate to the bathroom, when the walker slipped from under her. She fell onto the floor, hitting the left side of her face and her left hip  Nutrition-Focused physical exam completed. Findings are moderate fat depletion, moderate-severe muscle depletion, and mild edema.   Attempted to speak with [name] at bedside but she is hard of hearing and confused.  Per chart review pts wt only down 10#/7.7% over 8 months. Insignificant for time frame.  Pt was NPO until 1320 today. Unable to determine diet hx. Pt is high cardiac risk for surgery, family still considering options for surgical repair of LHF with current STEMI.  Labs and Medications reviewed: Mg 1.6 Senna-Docusate NS @ 13mL/hr   Diet Order:  Diet Heart Room service appropriate? Yes; Fluid consistency: Thin  Skin:  Reviewed, no issues (Laceration to L Eye)  Last BM:  7/30  Height:   Ht Readings from Last 1 Encounters:  08/21/15 5\' 1"  (1.549 m)    Weight:   Wt Readings from Last 1 Encounters:  08/21/15 119 lb (54 kg)    Ideal Body Weight:  47.72 kg  BMI:  Body mass index is  22.48 kg/m.  Estimated Nutritional Needs:   Kcal:  1300-1450 calories  Protein:  55-65 grams  Fluid:  >/= 1.3L  EDUCATION NEEDS:   No education needs identified at this time  Teresa Richards. Kavontae Pritchard, MS, RD LDN Inpatient Clinical Dietitian Pager [phone]

## 2015-08-23 DIAGNOSIS — S72002D Fracture of unspecified part of neck of left femur, subsequent encounter for closed fracture with routine healing: Secondary | ICD-10-CM

## 2015-08-23 DIAGNOSIS — T148 Other injury of unspecified body region: Secondary | ICD-10-CM

## 2015-08-23 DIAGNOSIS — D62 Acute posthemorrhagic anemia: Secondary | ICD-10-CM

## 2015-08-23 DIAGNOSIS — T148XXA Other injury of unspecified body region, initial encounter: Secondary | ICD-10-CM

## 2015-08-23 DIAGNOSIS — S0083XD Contusion of other part of head, subsequent encounter: Secondary | ICD-10-CM

## 2015-08-23 DIAGNOSIS — I10 Essential (primary) hypertension: Secondary | ICD-10-CM

## 2015-08-23 LAB — CBC
HEMATOCRIT: 28 % — AB (ref 36.0–46.0)
HEMOGLOBIN: 9 g/dL — AB (ref 12.0–15.0)
MCH: 27.2 pg (ref 26.0–34.0)
MCHC: 32.1 g/dL (ref 30.0–36.0)
MCV: 84.6 fL (ref 78.0–100.0)
Platelets: 131 10*3/uL — ABNORMAL LOW (ref 150–400)
RBC: 3.31 MIL/uL — AB (ref 3.87–5.11)
RDW: 14.3 % (ref 11.5–15.5)
WBC: 13.5 10*3/uL — ABNORMAL HIGH (ref 4.0–10.5)

## 2015-08-23 LAB — BASIC METABOLIC PANEL
Anion gap: 10 (ref 5–15)
BUN: 11 mg/dL (ref 6–20)
CHLORIDE: 106 mmol/L (ref 101–111)
CO2: 21 mmol/L — AB (ref 22–32)
CREATININE: 0.86 mg/dL (ref 0.44–1.00)
Calcium: 8.2 mg/dL — ABNORMAL LOW (ref 8.9–10.3)
GFR calc Af Amer: >60 mL/min (ref >60)
GFR calc non Af Amer: 56 mL/min — ABNORMAL LOW (ref >60)
Glucose, Bld: 157 mg/dL — ABNORMAL HIGH (ref 65–99)
POTASSIUM: 3 mmol/L — AB (ref 3.5–5.1)
SODIUM: 137 mmol/L (ref 135–145)

## 2015-08-23 LAB — MAGNESIUM: MAGNESIUM: 1.8 mg/dL (ref 1.7–2.4)

## 2015-08-23 MED ORDER — ACETAMINOPHEN 325 MG PO TABS: 650.0000 mg | ORAL_TABLET | Freq: Four times a day (QID) | ORAL | Status: DC | PRN

## 2015-08-23 MED ORDER — ASPIRIN EC 81 MG PO TBEC
81.0000 mg | DELAYED_RELEASE_TABLET | Freq: Two times a day (BID) | ORAL | Status: DC
  Administered 2015-08-23 – 2015-08-24 (×4): 81 mg via ORAL
  Filled 2015-08-23 (×4): qty 1

## 2015-08-23 MED ORDER — CEFAZOLIN SODIUM-DEXTROSE 2-4 GM/100ML-% IV SOLN
2.0000 g | Freq: Four times a day (QID) | INTRAVENOUS | Status: AC
  Administered 2015-08-23 (×2): 2 g via INTRAVENOUS
  Filled 2015-08-23 (×2): qty 100

## 2015-08-23 MED ORDER — PHENOL 1.4 % MT LIQD: 1.0000 | OROMUCOSAL | Status: DC | PRN

## 2015-08-23 MED ORDER — MAGNESIUM SULFATE 2 GM/50ML IV SOLN
2.0000 g | Freq: Once | INTRAVENOUS | Status: AC
  Administered 2015-08-23: 2 g via INTRAVENOUS
  Filled 2015-08-23: qty 50

## 2015-08-23 MED ORDER — ACETAMINOPHEN 500 MG PO TABS
1000.0000 mg | ORAL_TABLET | Freq: Four times a day (QID) | ORAL | Status: AC
  Administered 2015-08-23 – 2015-08-24 (×4): 1000 mg via ORAL
  Filled 2015-08-23 (×4): qty 2

## 2015-08-23 MED ORDER — POTASSIUM CHLORIDE CRYS ER 20 MEQ PO TBCR
20.0000 meq | EXTENDED_RELEASE_TABLET | Freq: Once | ORAL | Status: AC
  Administered 2015-08-23: 20 meq via ORAL
  Filled 2015-08-23: qty 1

## 2015-08-23 MED ORDER — ACETAMINOPHEN 650 MG RE SUPP: 650.0000 mg | Freq: Four times a day (QID) | RECTAL | Status: DC | PRN

## 2015-08-23 MED ORDER — ACETAMINOPHEN 500 MG PO TABS: 1000.0000 mg | ORAL_TABLET | Freq: Four times a day (QID) | ORAL | Status: DC

## 2015-08-23 MED ORDER — TRAMADOL HCL 50 MG PO TABS
50.0000 mg | ORAL_TABLET | Freq: Four times a day (QID) | ORAL | Status: DC | PRN
  Administered 2015-08-24: 50 mg via ORAL
  Filled 2015-08-23: qty 1

## 2015-08-23 MED ORDER — METOCLOPRAMIDE HCL 5 MG/ML IJ SOLN: 5.0000 mg | Freq: Three times a day (TID) | INTRAMUSCULAR | Status: DC | PRN

## 2015-08-23 MED ORDER — METOCLOPRAMIDE HCL 5 MG PO TABS: 5.0000 mg | ORAL_TABLET | Freq: Three times a day (TID) | ORAL | Status: DC | PRN

## 2015-08-23 MED ORDER — MENTHOL 3 MG MT LOZG: 1.0000 | LOZENGE | OROMUCOSAL | Status: DC | PRN

## 2015-08-23 MED ORDER — POTASSIUM CHLORIDE CRYS ER 20 MEQ PO TBCR
40.0000 meq | EXTENDED_RELEASE_TABLET | Freq: Once | ORAL | Status: AC
  Administered 2015-08-23: 40 meq via ORAL
  Filled 2015-08-23: qty 2

## 2015-08-23 MED ORDER — ONDANSETRON HCL 4 MG/2ML IJ SOLN
4.0000 mg | Freq: Four times a day (QID) | INTRAMUSCULAR | Status: DC | PRN
Start: 2015-08-23 — End: 2015-08-24

## 2015-08-23 MED ORDER — ONDANSETRON HCL 4 MG PO TABS: 4.0000 mg | ORAL_TABLET | Freq: Four times a day (QID) | ORAL | Status: DC | PRN

## 2015-08-23 NOTE — NC FL2 (Signed)
[place] LEVEL OF CARE SCREENING TOOL     IDENTIFICATION  Patient Name: Teresa Richards Birthdate: August 22, 1918 Sex: female Admission Date (Current Location): 08/21/2015  [facility] and [place] Number:  Herbalist and Address:  [facility] [place], [place] [address], [place], [place] [address]      Provider Number: [id number]  Attending Physician Name and Address:  Janece Canterbury, MD  Relative Name and Phone Number:       Current Level of Care: Hospital Recommended Level of Care: [place] Prior Approval Number:    Date Approved/Denied:   PASRR Number: [id number]  Discharge Plan: SNF    Current Diagnoses: Patient Active Problem List   Diagnosis Date Noted  . NSTEMI (non-ST elevated myocardial infarction) ([place]) 08/22/2015  . Displaced intertrochanteric fracture of left femur, initial encounter for closed fracture ([place]) 08/22/2015  . Closed left hip fracture ([place]) 08/21/2015  . Facial contusion 08/21/2015  . Constipation 11/16/2014  . Urinary incontinence 11/09/2014  . Abnormal TSH 10/26/2014  . Diabetic neuropathy ([place]) 10/27/2013  . Idiopathic scoliosis 10/27/2013  . Type 2 diabetes, controlled, with neuropathy ([place]) 04/21/2013  . Vertebral fracture 11/11/2012  . DNR (do not resuscitate) 07/04/2012  . Hypokalemia 05/23/2012  . Stroke ([place]) 05/19/2012  . HTN (hypertension) 05/19/2012  . 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 Anxiety state 05/13/2012  . Lumbago 04/25/2012  . Abnormality of gait 04/25/2012  . Fall at nursing home 03/08/2012  . Subdural hematoma ([place]) 03/08/2012  . Dementia 03/08/2012    Orientation RESPIRATION BLADDER Height & Weight     Self, Time, Place  Normal Continent Weight: 119 lb (54 kg) Height:  5\' 1"  (154.9 cm)  BEHAVIORAL SYMPTOMS/MOOD NEUROLOGICAL BOWEL NUTRITION STATUS   (None)  (Dementia, History of stroke) Continent Diet (Clear liquid)  AMBULATORY STATUS COMMUNICATION OF NEEDS  Skin   Extensive Assist Verbally Surgical wounds, Other (Comment) (Ecchymosis, Laceration on left eye)                       Personal Care Assistance Level of Assistance  Bathing, Feeding, Dressing Bathing Assistance: Maximum assistance Feeding assistance: Limited assistance Dressing Assistance: Maximum assistance     Functional Limitations Info  Sight, Hearing, Speech Sight Info: Adequate Hearing Info: Adequate Speech Info: Adequate    SPECIAL CARE FACTORS FREQUENCY  Blood pressure, Diabetic urine testing, PT (By licensed PT), OT (By licensed OT)     PT Frequency: 5 x week OT Frequency: 5 x week            Contractures Contractures Info: Not present    Additional Factors Info  Code Status, Allergies Code Status Info: DNR Allergies Info: Codeine           Current Medications (08/23/2015):  This is the current hospital active medication list Current Facility-Administered Medications  Medication Dose Route Frequency Provider Last Rate Last Dose  . 0.9 %  sodium chloride infusion   Intravenous Continuous Rod Can, MD 75 mL/hr at 08/23/15 0311    . acetaminophen (TYLENOL) tablet 650 mg  650 mg Oral Q6H PRN Rod Can, MD       Or  . acetaminophen (TYLENOL) suppository 650 mg  650 mg Rectal Q6H PRN Rod Can, MD      . acetaminophen (TYLENOL) tablet 1,000 mg  1,000 mg Oral Q6H Mariel Aloe, MD   1,000 mg at 08/23/15 1140  . aspirin EC tablet 81 mg  81 mg  Oral BID WC Rod Can, MD   81 mg at 08/23/15 0955  . donepezil (ARICEPT) tablet 10 mg  10 mg Oral QHS Mariel Aloe, MD   10 mg at 08/21/15 2317  . feeding supplement (BOOST / RESOURCE BREEZE) liquid 1 Container  1 Container Oral BID BM Mariel Aloe, MD   1 Container at 08/23/15 1439  . feeding supplement (ENSURE ENLIVE) (ENSURE ENLIVE) liquid 237 mL  237 mL Oral BID BM Mariel Aloe, MD   237 mL at 08/23/15 1400  . lisinopril (PRINIVIL,ZESTRIL) tablet 40 mg  40 mg Oral Daily Mariel Aloe,  MD   40 mg at 08/23/15 0955  . memantine (NAMENDA) tablet 10 mg  10 mg Oral BID Mariel Aloe, MD   10 mg at 08/23/15 0955  . menthol-cetylpyridinium (CEPACOL) lozenge 3 mg  1 lozenge Oral PRN Rod Can, MD       Or  . phenol (CHLORASEPTIC) mouth spray 1 spray  1 spray Mouth/Throat PRN Rod Can, MD      . metoCLOPramide (REGLAN) tablet 5-10 mg  5-10 mg Oral Q8H PRN Rod Can, MD       Or  . metoCLOPramide (REGLAN) injection 5-10 mg  5-10 mg Intravenous Q8H PRN Rod Can, MD      . metoprolol succinate (TOPROL-XL) 24 hr tablet 100 mg  100 mg Oral Daily Mariel Aloe, MD   100 mg at 08/23/15 0955  . ondansetron (ZOFRAN) tablet 4 mg  4 mg Oral Q6H PRN Rod Can, MD       Or  . ondansetron [facility]) injection 4 mg  4 mg Intravenous Q6H PRN Rod Can, MD      . senna-docusate (Senokot-S) tablet 1 tablet  1 tablet Oral Daily Mariel Aloe, MD   1 tablet at 08/23/15 0955  . traMADol (ULTRAM) tablet 50 mg  50 mg Oral Q6H PRN Rod Can, MD         Discharge Medications: Please see discharge summary for a list of discharge medications.  Relevant Imaging Results:  Relevant Lab Results:   Additional Information SS#: [id number]  Candie Chroman, LCSW

## 2015-08-23 NOTE — Progress Notes (Signed)
   Subjective:  Patient reports pain as mild to moderate.  No c/o.  Objective:   VITALS:   Vitals:   08/23/15 0047 08/23/15 0144 08/23/15 0255 08/23/15 0630  BP: (!) 180/87 (!) 150/52 (!) 129/50 111/61  Pulse: 81 87 88 78  Resp: 17 16 15 16   Temp: 98 F (36.7 C) 98.4 F (36.9 C) 98.5 F (36.9 C) 98.3 F (36.8 C)  TempSrc: Axillary Axillary Axillary Axillary  SpO2: 96% 97% 96% 95%  Weight:      Height:        ABD soft Sensation intact distally Intact pulses distally Dorsiflexion/Plantar flexion intact Incision: dressing C/D/I Compartment soft   Lab Results  Component Value Date   WBC 13.5 (H) 08/23/2015   HGB 9.0 (L) 08/23/2015   HCT 28.0 (L) 08/23/2015   MCV 84.6 08/23/2015   PLT 131 (L) 08/23/2015   BMET    Component Value Date/Time   NA 137 08/23/2015 0514   NA 143 05/05/2015   K 3.0 (L) 08/23/2015 0514   CL 106 08/23/2015 0514   CO2 21 (L) 08/23/2015 0514   GLUCOSE 157 (H) 08/23/2015 0514   BUN 11 08/23/2015 0514   BUN 14 05/05/2015   CREATININE 0.86 08/23/2015 0514   CALCIUM 8.2 (L) 08/23/2015 0514   GFRNONAA 56 (L) 08/23/2015 0514   GFRAA >60 08/23/2015 0514     Assessment/Plan: 1 Day Post-Op   Principal Problem:   Closed left hip fracture (HCC) Active Problems:   Dementia   HTN (hypertension)   Hypokalemia   Type 2 diabetes, controlled, with neuropathy (HCC)   Facial contusion   NSTEMI (non-ST elevated myocardial infarction) ([place])   Displaced intertrochanteric fracture of left femur, initial encounter for closed fracture (HCC)   WBAT with walker DVT ppx: ASA, SCDs, TEDs PO pain control: avoid narcotics PT/OT D/C planning    Teresa Richards, Horald Pollen 08/23/2015, 8:00 AM   Rod Can, MD Cell [phone]

## 2015-08-23 NOTE — Care Management Important Message (Signed)
Important Message  Patient Details  Name: Teresa Richards MRN: [id number] Date of Birth: 02-11-1918   Medicare Important Message Given:  Yes    Loann Quill 08/23/2015, 8:16 AM

## 2015-08-23 NOTE — Evaluation (Signed)
Physical Therapy Evaluation Patient Details Name: Teresa Richards MRN: [id number] DOB: [birth year] Today's Date: 08/23/2015   History of Present Illness  Pt admitted with L hip fx sustained in a fall at her ALF. s/p IM fixation. Pt also with NSTEMI and facial contusions. PMH:  dementia, HTN, DM, neuropathy, A-fib, Bradycardia, Depression, and Glaucoma.   Clinical Impression  Pt very motivated for mobility and participation in therapy, but is limited by pain and overall weakness.  Pt with cognitive impairments at baseline, but does not impact participation.  Feel pt would benefit from moving to SNF level of care for further therapies and nsg A at D/C.  Will continue to follow while on acute.      Follow Up Recommendations SNF    Equipment Recommendations  None recommended by PT    Recommendations for Other Services       Precautions / Restrictions Precautions Precautions: Fall Restrictions Weight Bearing Restrictions: Yes LLE Weight Bearing: Weight bearing as tolerated      Mobility  Bed Mobility Overal bed mobility: Needs Assistance Bed Mobility: Supine to Sit     Supine to sit: Max assist;HOB elevated     General bed mobility comments: use of pad, assist for LEs and trunk  Transfers Overall transfer level: Needs assistance Equipment used: Rolling walker (2 wheeled) Transfers: Sit to/from [facility] Sit to Stand: +2 safety/equipment;Mod assist Stand pivot transfers: +2 physical assistance;Max assist       General transfer comment: cues for hand placement, assist to rise and shift weight over feet, difficulty advancing L LE to step, sat abruptly in chair  Ambulation/Gait                Stairs            Wheelchair Mobility    Modified Rankin (Stroke Patients Only)       Balance Overall balance assessment: Needs assistance Sitting-balance support: Feet supported Sitting balance-Leahy Scale: Fair     Standing balance support:  Bilateral upper extremity supported;During functional activity Standing balance-Leahy Scale: Poor                               Pertinent Vitals/Pain Pain Assessment: Faces Faces Pain Scale: Hurts even more Pain Location: L hip with mobility Pain Descriptors / Indicators: Grimacing;Guarding;Sore Pain Intervention(s): Monitored during session;Premedicated before session;Repositioned    Home Living Family/patient expects to be discharged to:: Skilled nursing facility                      Prior Function Level of Independence: Needs assistance   Gait / Transfers Assistance Needed: walked with a RW and assistance within facility  ADL's / Homemaking Assistance Needed: self fed, assisted for toileting, bathing and dressing, but participated as much as she was able        Hand Dominance   Dominant Hand: Right    Extremity/Trunk Assessment   Upper Extremity Assessment: Defer to OT evaluation           Lower Extremity Assessment: Generalized weakness;LLE deficits/detail   LLE Deficits / Details: pt does have AROM in knee/ankle/foot, but AAROM at hip.  Limited by pain and post-op weakness.    Cervical / Trunk Assessment: Kyphotic  Communication   Communication: HOH  Cognition Arousal/Alertness: Awake/alert Behavior During Therapy: WFL for tasks assessed/performed Overall Cognitive Status: History of cognitive impairments - at baseline (pt disoriented to situation, oriented to  place and year)                      General Comments      Exercises        Assessment/Plan    PT Assessment Patient needs continued PT services  PT Diagnosis Difficulty walking;Generalized weakness;Acute pain   PT Problem List Decreased strength;Decreased range of motion;Decreased activity tolerance;Decreased balance;Decreased mobility;Decreased coordination;Decreased cognition;Decreased knowledge of use of DME;Decreased safety awareness;Pain  PT Treatment  Interventions DME instruction;Gait training;Functional mobility training;Therapeutic activities;Therapeutic exercise;Balance training;Patient/family education   PT Goals (Current goals can be found in the Care Plan section) Acute Rehab PT Goals Patient Stated Goal: pt is just glad the surgery is over PT Goal Formulation: With patient Time For Goal Achievement: 09/06/15 Potential to Achieve Goals: Good    Frequency Min 3X/week   Barriers to discharge        Co-evaluation PT/OT/SLP Co-Evaluation/Treatment: Yes Reason for Co-Treatment: For patient/therapist safety PT goals addressed during session: Mobility/safety with mobility;Balance;Proper use of DME         End of Session Equipment Utilized During Treatment: Gait belt Activity Tolerance: Patient limited by pain;Patient limited by fatigue Patient left: in chair;with call bell/phone within reach;with chair alarm set Nurse Communication: Mobility status         Time: [id number] PT Time Calculation (min) (ACUTE ONLY): 43 min   Charges:   PT Evaluation $PT Eval Moderate Complexity: 1 Procedure     PT G CodesCatarina Hartshorn, [place] 08/23/2015, 2:04 PM

## 2015-08-23 NOTE — Clinical Social Work Note (Signed)
Clinical Social Work Assessment  Patient Details  Name: Teresa Richards MRN: [id number] Date of Birth: 1918-10-04  Date of referral:  08/23/15               Reason for consult:  Discharge Planning                Permission sought to share information with:  Facility Sport and exercise psychologist, Family Supports Permission granted to share information::  Yes, Verbal Permission Granted  Name::     [name]  Agency::  [facility]  Relationship::  Daughter  Contact Information:  [phone]  Housing/Transportation Living arrangements for the past 2 months:  [place] of Information:  Medical Team, Adult Children Patient Interpreter Needed:  None Criminal Activity/Legal Involvement Pertinent to Current Situation/Hospitalization:  No - Comment as needed Significant Relationships:  Adult Children Lives with:  Facility Resident Do you feel safe going back to the place where you live?  Yes Need for family participation in patient care:  Yes (Comment)  Care giving concerns:  Patient is from [facility] as a SNF resident.   Social Worker assessment / plan:  Patient not fully oriented. CSW called patient's daughter. CSW introduced role and explained that discharge planning would be discussed. [name] daughter confirmed that patient is from [facility] and the plan is to return when discharged. Patient has been at [facility] for years, switching between [place], ALF, and SNF. She has been a SNF resident since October 2016. CSW explained that we are awaiting PT recommendations. Patient will need PTAR when she discharges. No further concerns. CSW encouraged patient's daughter to contact CSW as needed. CSW will continue to follow patient and her daughter for support and facilitate discharge back to SNF once medically stable.  Employment status:  Retired Nurse, adult PT Recommendations:  Not assessed at this time Information / Referral to  community resources:  Other (Comment Required) (Plan is to return to [facility])  Patient/Family's Response to care:  Patient not fully oriented. [name] daughter agreeable to return to SNF. [name] daughter supportive and involved in patient's care. [name] daughter appreciated social work intervention.  Patient/Family's Understanding of and Emotional Response to Diagnosis, Current Treatment, and Prognosis:  Patient not fully oriented. [name] daughter understands need for return to SNF once discharged. [name] daughter appears happy with hospital care.  Emotional Assessment Appearance:  Appears stated age Attitude/Demeanor/Rapport:  Unable to Assess Affect (typically observed):  Unable to Assess Orientation:  Oriented to Self, Oriented to Place, Oriented to  Time Alcohol / Substance use:  Never Used Psych involvement (Current and /or in the community):  No (Comment)  Discharge Needs  Concerns to be addressed:  Care Coordination Readmission within the last 30 days:  No Current discharge risk:  Cognitively Impaired Barriers to Discharge:  [place], LCSW 08/23/2015, 12:15 PM

## 2015-08-23 NOTE — Progress Notes (Signed)
PROGRESS NOTE  NABELLA BRACEY  [id number] DOB: 18-Jul-1918 DOA: 08/21/2015 PCP: Jeanmarie Hubert, MD  Brief Narrative:  Patient is a 82 year old female that presented from SNF after suffering an unwitnessed fall. Patient suffered trauma to her left face and left hip. Unknown amount of time down, however possibly one hour. Patient was found to have a left femur fracture and no evidence of intracranial bleed or facial fracture. Orthopedic surgery was consulted for surgery however patient was found to have a possible active and elevated troponin, possible NSTEMI.  Assessment & Plan:   Principal Problem:   Closed left hip fracture (HCC) Active Problems:   Dementia   HTN (hypertension)   Hypokalemia   Type 2 diabetes, controlled, with neuropathy (HCC)   Facial contusion   NSTEMI (non-ST elevated myocardial infarction) ([place])   Displaced intertrochanteric fracture of left femur, initial encounter for closed fracture (HCC)   Left hip fracture, closed s/p ORIF on 7/31 by Dr. Lyla Glassing -  Appreciate orthopedic surgery assistance -  DVT prophylaxis per orthopedics -  PT eval:  SNF - SW consult for SNF placement  NSTEMI, troponins mildly elevated and new ST-T changes (t-wave inversion) on EKG.  Continues to deny chest pain.  Not a candidate for intervention given age and frailty -  Appreciate cardiology assistance > signed off -  Medical management with aspirin, beta blocker  Left sided facial contusion No fracture. Large hematoma -wound care  Hypokalemia, mild hypomagnesemia -  Magnesium sulfate 2gm IV once -  KCl total of 42meq today -  Repeat BMP, Mag in AM  Would like to keep K>4, Mag > 2  Hypertension -continue lisinopril 40mg  daily -continue metoprolol succinate 100mg  daily  Dementia -continue donepezil 10mg  qhs  Diabetes mellitus, type 2, not on insulin - SSI sensitive  Acute post-operative anemia, no need for blood transfusion today -  Repeat CBC in AM  Mild  leukocytosis likely reactive post-operatively.  Also had a low-grade fever, however.  Atelectasis and UTI on ddx. -  Check UA/Urine culture -  CXR on 7/30 was negative -  IS  DVT prophylaxis: SCDs Code Status:  DNR Family Communication:  patient alone Disposition Plan:  anticipate discharge to SNF for rehab in 1-2 days  Consultants:   Orthopedic surgery (Dr. Lyla Glassing)  Cardiology [facility])  Procedures:   Echo pending  Possible fixation of left femur fracture  Antimicrobials:  None   Subjective: Denies pain and states that she does not even remember having surgery.  Breathing okay, denies nausea or other discomforts this morning.    Objective: Vitals:   08/23/15 0255 08/23/15 0630 08/23/15 0953 08/23/15 1355  BP: (!) 129/50 111/61 (!) 114/58 (!) 114/46  Pulse: 88 78 79 73  Resp: 15 16 18 17   Temp: 98.5 F (36.9 C) 98.3 F (36.8 C) 98 F (36.7 C) 98.4 F (36.9 C)  TempSrc: Axillary Axillary Oral Oral  SpO2: 96% 95% 98% 92%  Weight:      Height:        Intake/Output Summary (Last 24 hours) at 08/23/15 2006 Last data filed at 08/23/15 1900  Gross per 24 hour  Intake             1718 ml  Output              840 ml  Net              878 ml   Filed Weights   08/21/15 1037  Weight: 54 kg (  119 lb)    Examination: General exam: Appears calm and comfortable, bilateral black eyes, abrasion on nose Respiratory system: Clear to auscultation. Respiratory effort normal. Cardiovascular system: RRR. 2/6 systolic murmur. No JVD, rubs, gallops or clicks. No pedal edema. Gastrointestinal system: Abdomen is nondistended, soft and nontender. No organomegaly or masses felt. Normal bowel sounds heard. Central nervous system: Alert and oriented. No focal neurological deficits. Extremities: 4/5 upper extremity strength. 3/5 lower extremity strength.  Left lower extremity with minimal swelling, hip bandages are c/d/i, no hematoma formation palpable Psychiatry: Mood & affect  appropriate. Oriented to person and place.    Data Reviewed: I have personally reviewed following labs and imaging studies  CBC:  Recent Labs Lab 08/21/15 1209 08/22/15 0429 08/23/15 0514  WBC 12.0* 8.0 13.5*  NEUTROABS 10.6* 5.4  --   HGB 13.9 11.5* 9.0*  HCT 43.0 34.6* 28.0*  MCV 84.5 83.6 84.6  PLT 177 160 [id number]*   Basic Metabolic Panel:  Recent Labs Lab 08/21/15 1209 08/22/15 0429 08/22/15 1020 08/23/15 0514 08/23/15 0730  NA 141 137  --  137  --   K 3.1* 4.3  --  3.0*  --   CL 105 107  --  106  --   CO2 26 23  --  21*  --   GLUCOSE 137* 105*  --  157*  --   BUN 11 13  --  11  --   CREATININE 0.60 0.66  --  0.86  --   CALCIUM 9.4 8.5*  --  8.2*  --   MG  --   --  1.6*  --  1.8   GFR: Estimated Creatinine Clearance: 30.2 mL/min (by C-G formula based on SCr of 0.86 mg/dL). Liver Function Tests:  Recent Labs Lab 08/21/15 1209 08/22/15 0429  AST 20 15  ALT 17 13*  ALKPHOS 66 51  BILITOT 0.7 1.2  PROT 7.2 5.9*  ALBUMIN 4.3 3.6   No results for input(s): LIPASE, AMYLASE in the last 168 hours. No results for input(s): AMMONIA in the last 168 hours. Coagulation Profile: No results for input(s): INR, PROTIME in the last 168 hours. Cardiac Enzymes:  Recent Labs Lab 08/21/15 1827 08/21/15 2257 08/22/15 0429 08/22/15 1020  TROPONINI 0.13* 0.16* 0.26* 0.29*   BNP (last 3 results) No results for input(s): PROBNP in the last 8760 hours. HbA1C: No results for input(s): HGBA1C in the last 72 hours. CBG:  Recent Labs Lab 08/22/15 2256  GLUCAP 98   Lipid Profile: No results for input(s): CHOL, HDL, LDLCALC, TRIG, CHOLHDL, LDLDIRECT in the last 72 hours. Thyroid Function Tests: No results for input(s): TSH, T4TOTAL, FREET4, T3FREE, THYROIDAB in the last 72 hours. Anemia Panel: No results for input(s): VITAMINB12, FOLATE, FERRITIN, TIBC, IRON, RETICCTPCT in the last 72 hours. Urine analysis:    Component Value Date/Time   COLORURINE YELLOW  05/19/2012 [place] 05/19/2012 1235   LABSPEC 1.013 05/19/2012 1235   PHURINE 7.5 05/19/2012 1235   GLUCOSEU 250 (A) 05/19/2012 1235   HGBUR TRACE (A) 05/19/2012 1235   BILIRUBINUR NEGATIVE 05/19/2012 1235   KETONESUR 15 (A) 05/19/2012 1235   PROTEINUR >300 (A) 05/19/2012 1235   UROBILINOGEN 0.2 05/19/2012 1235   NITRITE NEGATIVE 05/19/2012 1235   LEUKOCYTESUR NEGATIVE 05/19/2012 1235   Sepsis Labs: @LABRCNTIP (procalcitonin:4,lacticidven:4)  ) Recent Results (from the past 240 hour(s))  Surgical pcr screen     Status: None   Collection Time: 08/21/15  4:11 PM  Result Value Ref  Range Status   MRSA, PCR NEGATIVE NEGATIVE Final   Staphylococcus aureus NEGATIVE NEGATIVE Final    Comment:        The Xpert SA Assay (FDA approved for NASAL specimens in patients over 68 years of age), is one component of a comprehensive surveillance program.  Test performance has been validated by [facility] for patients greater than or equal to 61 year old. It is not intended to diagnose infection nor to guide or monitor treatment.   Surgical PCR screen     Status: None   Collection Time: 08/22/15  7:37 PM  Result Value Ref Range Status   MRSA, PCR NEGATIVE NEGATIVE Final   Staphylococcus aureus NEGATIVE NEGATIVE Final    Comment:        The Xpert SA Assay (FDA approved for NASAL specimens in patients over 13 years of age), is one component of a comprehensive surveillance program.  Test performance has been validated by [facility] for patients greater than or equal to 95 year old. It is not intended to diagnose infection nor to guide or monitor treatment.       Radiology Studies: Pelvis Portable  Result Date: 08/22/2015 CLINICAL DATA:  Post ORIF left hip fracture. EXAM: PORTABLE PELVIS 1-2 VIEWS COMPARISON:  Preoperative radiographs yesterday FINDINGS: Interval intra medullary nail and like screw fixation of comminuted intertrochanteric left hip fracture.  Alignment is improved from preoperative exam. No apparent immediate postoperative complication. No additional acute fracture of the bony pelvis. IMPRESSION: Post ORIF intertrochanteric left hip fracture. No immediate postoperative complication. Electronically Signed   By: Jeb Levering M.D.   On: 08/22/2015 23:53   Dg C-arm 1-60 Min  Result Date: 08/22/2015 CLINICAL DATA:  ORIF left hip fracture. EXAM: DG C-ARM 61-120 MIN; LEFT FEMUR 2 VIEWS COMPARISON:  Preoperative radiographs. FINDINGS: Four fluoroscopic spot images from the operating room post fixation of intertrochanteric left hip fracture demonstrate intra medullary femoral nail with distal locking and proximal lag screws. The fracture is in improved alignment. Total fluoroscopy time 1 minutes 15 seconds. IMPRESSION: Intraoperative fluoroscopy during intertrochanteric left hip fracture ORIF. Electronically Signed   By: Jeb Levering M.D.   On: 08/22/2015 23:51   Dg Femur Min 2 Views Left  Result Date: 08/22/2015 CLINICAL DATA:  ORIF left hip fracture. EXAM: DG C-ARM 61-120 MIN; LEFT FEMUR 2 VIEWS COMPARISON:  Preoperative radiographs. FINDINGS: Four fluoroscopic spot images from the operating room post fixation of intertrochanteric left hip fracture demonstrate intra medullary femoral nail with distal locking and proximal lag screws. The fracture is in improved alignment. Total fluoroscopy time 1 minutes 15 seconds. IMPRESSION: Intraoperative fluoroscopy during intertrochanteric left hip fracture ORIF. Electronically Signed   By: Jeb Levering M.D.   On: 08/22/2015 23:51   Dg Femur Port Min 2 Views Left  Result Date: 08/22/2015 CLINICAL DATA:  Status post IM nail left femur. EXAM: LEFT FEMUR PORTABLE 2 VIEWS COMPARISON:  Preoperative radiographs yesterday. FINDINGS: Intra medullary nail with distal locking and proximal lag screw traverses intertrochanteric left hip fracture. Fracture is in improved alignment. Recent postsurgical change  includes air in the subcutaneous soft tissues. No immediate complication is seen. IMPRESSION: ORIF of intertrochanteric left hip fracture in improved alignment. Electronically Signed   By: Jeb Levering M.D.   On: 08/22/2015 23:49     Scheduled Meds: . acetaminophen  1,000 mg Oral Q6H  . aspirin EC  81 mg Oral BID WC  . donepezil  10 mg Oral QHS  . feeding supplement  1 Container Oral BID BM  . feeding supplement (ENSURE ENLIVE)  237 mL Oral BID BM  . lisinopril  40 mg Oral Daily  . memantine  10 mg Oral BID  . metoprolol succinate  100 mg Oral Daily  . senna-docusate  1 tablet Oral Daily   Continuous Infusions: . sodium chloride 75 mL/hr at 08/23/15 0311     LOS: 2 days    Time spent: 30 min    Janece Canterbury, MD [facility] Pager [phone]  If 7PM-7AM, please contact night-coverage [email or web address] Password [email or web address] 08/23/2015, 8:06 PM

## 2015-08-23 NOTE — Progress Notes (Signed)
    Subjective:  Denies CP or dyspnea   Objective:  Vitals:   08/23/15 0144 08/23/15 0255 08/23/15 0630 08/23/15 0953  BP: (!) 150/52 (!) 129/50 111/61 (!) 114/58  Pulse: 87 88 78 79  Resp: 16 15 16 18   Temp: 98.4 F (36.9 C) 98.5 F (36.9 C) 98.3 F (36.8 C) 98 F (36.7 C)  TempSrc: Axillary Axillary Axillary Oral  SpO2: 97% 96% 95% 98%  Weight:      Height:        Intake/Output from previous day:  Intake/Output Summary (Last 24 hours) at 08/23/15 1048 Last data filed at 08/23/15 0630  Gross per 24 hour  Intake             1225 ml  Output             1390 ml  Net             -165 ml    Physical Exam: Physical exam: Well-developed frail in no acute distress.  Skin is warm and dry.  HEENT with periorbital ecchymoses Neck is supple.  Chest is clear to auscultation with normal expansion.  Cardiovascular exam is regular rate and rhythm.  Abdominal exam nontender or distended. No masses palpated. Extremities show no edema. S/p repair of fx neuro grossly intact    Lab Results: Basic Metabolic Panel:  Recent Labs  08/22/15 0429 08/22/15 1020 08/23/15 0514 08/23/15 0730  NA 137  --  137  --   K 4.3  --  3.0*  --   CL 107  --  106  --   CO2 23  --  21*  --   GLUCOSE 105*  --  157*  --   BUN 13  --  11  --   CREATININE 0.66  --  0.86  --   CALCIUM 8.5*  --  8.2*  --   MG  --  1.6*  --  1.8   CBC:  Recent Labs  08/21/15 1209 08/22/15 0429 08/23/15 0514  WBC 12.0* 8.0 13.5*  NEUTROABS 10.6* 5.4  --   HGB 13.9 11.5* 9.0*  HCT 43.0 34.6* 28.0*  MCV 84.5 83.6 84.6  PLT 177 160 131*   Cardiac Enzymes:  Recent Labs  08/21/15 2257 08/22/15 0429 08/22/15 1020  TROPONINI 0.16* 0.26* 0.29*     Assessment/Plan:  1 Elevated troponin-not clearly consistent with ACS; continue ASA and metoprolol; LV function normal; not a candidate for aggressive cardiac eval 2 s/p repair left hip fx-per orthopedics. 3 HTN-continue present meds. We will sign off;  please call with questions.  Kirk Ruths 08/23/2015, 10:48 AM

## 2015-08-23 NOTE — Evaluation (Signed)
Occupational Therapy Evaluation Patient Details Name: Teresa Richards MRN: [id number] DOB: July 24, 1918 Today's Date: 08/23/2015    History of Present Illness Pt admitted with L hip fx sustained in a fall at her ALF. s/p IM fixation. Pt also with NSTEMI and facial contusions. PMH:  dementia, HTN, DM, neuropathy.   Clinical Impression   Pt is assisted as she walks with a RW and with ADL at her ALF. Pt presents with baseline cognitive impairment, generalized weakness, L hip pain and poor balance. Two person assist was required for OOB to chair. Pt not able to demonstrate ability to ambulate. She requires set up to total assist for ADL. Pt will need post acute rehab in SNF. Will defer further OT to SNF.    Follow Up Recommendations  SNF;Supervision/Assistance - 24 hour    Equipment Recommendations       Recommendations for Other Services       Precautions / Restrictions Precautions Precautions: Fall Restrictions Weight Bearing Restrictions: Yes LLE Weight Bearing: Weight bearing as tolerated      Mobility Bed Mobility Overal bed mobility: Needs Assistance Bed Mobility: Supine to Sit     Supine to sit: Max assist;HOB elevated     General bed mobility comments: use of pad, assist for LEs and trunk  Transfers Overall transfer level: Needs assistance Equipment used: Rolling walker (2 wheeled) Transfers: Sit to/from [facility] Sit to Stand: +2 safety/equipment;Mod assist Stand pivot transfers: +2 physical assistance;Max assist       General transfer comment: cues for hand placement, assist to rise and shift weight over feet, difficulty advancing L LE to step, sat abruptly in chair    Balance Overall balance assessment: Needs assistance Sitting-balance support: Feet supported Sitting balance-Leahy Scale: Fair       Standing balance-Leahy Scale: Poor                              ADL Overall ADL's : Needs assistance/impaired Eating/Feeding:  Set up;Sitting Eating/Feeding Details (indicate cue type and reason): assist to open packages Grooming: Set up;Sitting   Upper Body Bathing: Minimal assitance;Sitting   Lower Body Bathing: +2 for physical assistance;Total assistance;Sit to/from stand   Upper Body Dressing : Minimal assistance;Sitting   Lower Body Dressing: +2 for physical assistance;Total assistance;Sit to/from stand   Toilet Transfer: +2 for physical assistance;Maximal assistance;RW;Stand-pivot Toilet Transfer Details (indicate cue type and reason): simulated to chair Toileting- Clothing Manipulation and Hygiene: +2 for physical assistance;Total assistance;Sit to/from stand               Vision     Perception     Praxis      Pertinent Vitals/Pain Pain Assessment: Faces Faces Pain Scale: Hurts even more Pain Location: L hip with mobility Pain Descriptors / Indicators: Sore;Guarding;Grimacing Pain Intervention(s): Monitored during session;Repositioned;Ice applied;Limited activity within patient's tolerance     Hand Dominance Right   Extremity/Trunk Assessment Upper Extremity Assessment Upper Extremity Assessment: Generalized weakness   Lower Extremity Assessment Lower Extremity Assessment: Defer to PT evaluation   Cervical / Trunk Assessment Cervical / Trunk Assessment: Kyphotic   Communication Communication Communication: HOH   Cognition Arousal/Alertness: Awake/alert Behavior During Therapy: WFL for tasks assessed/performed Overall Cognitive Status: History of cognitive impairments - at baseline (pt disoriented to situation, oriented to place and year)       Memory: Decreased short-term memory             General Comments  Exercises       Shoulder Instructions      Home Living Family/patient expects to be discharged to:: Skilled nursing facility                                        Prior Functioning/Environment Level of Independence: Needs  assistance  Gait / Transfers Assistance Needed: walked with a RW and assistance within facility ADL's / Homemaking Assistance Needed: self fed, assisted for toileting, bathing and dressing, but participated as much as she was able        OT Diagnosis: Generalized weakness;Cognitive deficits;Acute pain   OT Problem List: Decreased strength;Decreased activity tolerance;Impaired balance (sitting and/or standing);Decreased cognition;Decreased safety awareness;Decreased knowledge of use of DME or AE;Pain   OT Treatment/Interventions:      OT Goals(Current goals can be found in the care plan section) Acute Rehab OT Goals Patient Stated Goal: pt is just glad the surgery is over  OT Frequency:     Barriers to D/C:            Co-evaluation PT/OT/SLP Co-Evaluation/Treatment: Yes Reason for Co-Treatment: For patient/therapist safety          End of Session Equipment Utilized During Treatment: Gait belt;Rolling walker Nurse Communication: Mobility status  Activity Tolerance: Patient limited by pain Patient left: in chair;with call bell/phone within reach;with chair alarm set   Time: [phone] OT Time Calculation (min): 36 min Charges:  OT General Charges $OT Visit: 1 Procedure OT Evaluation $OT Eval Moderate Complexity: 1 Procedure G-Codes:    Malka So 08/23/2015, 10:39 AM  [phone]

## 2015-08-24 ENCOUNTER — Encounter (HOSPITAL_COMMUNITY): Payer: Self-pay | Admitting: Orthopedic Surgery

## 2015-08-24 DIAGNOSIS — S0083XS Contusion of other part of head, sequela: Secondary | ICD-10-CM

## 2015-08-24 DIAGNOSIS — S72002S Fracture of unspecified part of neck of left femur, sequela: Secondary | ICD-10-CM

## 2015-08-24 DIAGNOSIS — F0390 Unspecified dementia without behavioral disturbance: Secondary | ICD-10-CM

## 2015-08-24 LAB — CBC
HCT: 24.3 % — ABNORMAL LOW (ref 36.0–46.0)
HEMATOCRIT: 29.4 % — AB (ref 36.0–46.0)
HEMOGLOBIN: 7.9 g/dL — AB (ref 12.0–15.0)
Hemoglobin: 9.5 g/dL — ABNORMAL LOW (ref 12.0–15.0)
MCH: 27.1 pg (ref 26.0–34.0)
MCH: 27.5 pg (ref 26.0–34.0)
MCHC: 32.5 g/dL (ref 30.0–36.0)
MCHC: 32.3 g/dL (ref 30.0–36.0)
MCV: 83.2 fL (ref 78.0–100.0)
MCV: 85 fL (ref 78.0–100.0)
PLATELETS: 119 10*3/uL — AB (ref 150–400)
Platelets: 122 10*3/uL — ABNORMAL LOW (ref 150–400)
RBC: 2.92 MIL/uL — AB (ref 3.87–5.11)
RBC: 3.46 MIL/uL — ABNORMAL LOW (ref 3.87–5.11)
RDW: 14.4 % (ref 11.5–15.5)
RDW: 14.4 % (ref 11.5–15.5)
WBC: 8.9 10*3/uL (ref 4.0–10.5)
WBC: 10.8 10*3/uL — AB (ref 4.0–10.5)

## 2015-08-24 LAB — BASIC METABOLIC PANEL
ANION GAP: 7 (ref 5–15)
BUN: 19 mg/dL (ref 6–20)
CHLORIDE: 107 mmol/L (ref 101–111)
CO2: 23 mmol/L (ref 22–32)
Calcium: 8.4 mg/dL — ABNORMAL LOW (ref 8.9–10.3)
Creatinine, Ser: 0.76 mg/dL (ref 0.44–1.00)
GFR calc Af Amer: >60 mL/min (ref >60)
GFR calc non Af Amer: >60 mL/min (ref >60)
Glucose, Bld: 145 mg/dL — ABNORMAL HIGH (ref 65–99)
POTASSIUM: 3.7 mmol/L (ref 3.5–5.1)
SODIUM: 137 mmol/L (ref 135–145)

## 2015-08-24 LAB — PREPARE RBC (CROSSMATCH)

## 2015-08-24 MED ORDER — SODIUM CHLORIDE 0.9 % IV SOLN
Freq: Once | INTRAVENOUS | Status: AC
  Administered 2015-08-24: 10:00:00 via INTRAVENOUS

## 2015-08-24 MED ORDER — ASPIRIN 81 MG PO TBEC: 81.0000 mg | DELAYED_RELEASE_TABLET | Freq: Two times a day (BID) | ORAL | 1 refills | Status: AC

## 2015-08-24 NOTE — Discharge Summary (Addendum)
Physician Discharge Summary  Teresa Richards [id number] DOB: 1918/10/08 DOA: 08/21/2015  PCP: Jeanmarie Hubert, MD  Admit date: 08/21/2015 Discharge date: 08/24/2015  Recommendations for Outpatient Follow-up:  1. Aspirin on discharge for DVT prophylaxis; For 6 weeks after discharge aspirin should be taken twice daily. Patient is actually on 1 day regimen and she can resume that after 6 weeks of taking twice a day aspirin.  2. Patient to get one unit of PRBC transfusion prior to discharge. Her hemoglobin should be rechecked in skilled nursing facility in 2-3 days after discharge to make sure it is stable. Please note since she is on aspirin if her hemoglobin continues to drop please consider stopping aspirin.   Discharge Diagnoses:  Principal Problem:   Closed left hip fracture ([place]) Active Problems:   Dementia   HTN (hypertension)   Hypokalemia   Type 2 diabetes, controlled, with neuropathy (HCC)   Facial contusion   NSTEMI (non-ST elevated myocardial infarction) ([place])   Displaced intertrochanteric fracture of left femur, initial encounter for closed fracture (HCC)   Abrasion   Postoperative anemia due to acute blood loss    Discharge Condition: stable   Diet recommendation: as tolerated   History of present illness:   Per brief narrative 8/1 "Patient is a 82 year old female that presented from SNF after suffering an unwitnessed fall. Patient suffered trauma to her left face and left hip. Unknown amount of time down, however possibly one hour. Patient was found to have a left femur fracture and no evidence of intracranial bleed or facial fracture. Orthopedic surgery was consulted for surgery however patient was found to have a possible active and elevated troponin, possible NSTEMI."   Hospital Course:   Assessment & Plan:   Left hip fracture, closed s/p ORIF on 7/31 by Dr. Lyla Glassing - Appreciate orthopedic surgery assistance - DVT prophylaxis per orthopedics, aspirin BID for  6 weeks  - Stable for d/c to SNF today   NSTEMI - Troponin levels mildly elevated - No chest pain - No cardiac workup required per cardiology - Continue aspirin and beta blocker  Left sided facial contusion - No fracture. Hematoma present.  Acute blood loss anemia - Likely postoperative - Hemoglobin is 7.9 this morning. Will repeat CBC and transfuse 1 unit of PRBC prior to transfusion - As noted above, please consider holding aspirin in skilled nursing facility if hemoglobin continues to drop.  Hypokalemia, mild hypomagnesemia - Supplemented   Hypertension - Continue lisinopril 40 mg daily, metoprolol 100 mg daily   Dementia - Continue home meds   Diabetes mellitus, type 2, not on insulin - SSI sensitive in hospital   Mild leukocytosis - Likely reactive post-operatively - CXR on 7/30 was negative  Moderate protein calorie malnutrition - Seen by dietician - In the context of chronic illness   DVT prophylaxis:SCDs, aspirin  Code Status: DNR/DNI Family Communication:family not at the bedside this am    Consultants:  Orthopedic surgery (Dr. Lyla Glassing)  Cardiology [facility])  Procedures:  Echo pending  Possible fixation of left femur fracture  Antimicrobials:  None   Signed:  Leisa Lenz, MD  [facility] 08/24/2015, 9:36 AM  Pager #: [phone]  Time spent in minutes: more than 30 minutes    Discharge Exam: Vitals:   08/23/15 2127 08/24/15 0518  BP: (!) 117/50 (!) 90/43  Pulse: 75 (!) 57  Resp: 16 16  Temp: 97.8 F (36.6 C) 97.6 F (36.4 C)   Vitals:   08/23/15 0953 08/23/15 1355 08/23/15  2127 08/24/15 0518  BP: (!) 114/58 (!) 114/46 (!) 117/50 (!) 90/43  Pulse: 79 73 75 (!) 57  Resp: 18 17 16 16   Temp: 98 F (36.7 C) 98.4 F (36.9 C) 97.8 F (36.6 C) 97.6 F (36.4 C)  TempSrc: Oral Oral Oral Oral  SpO2: 98% 92% 94% 94%  Weight:      Height:        General: Pt is alert, follows commands appropriately, not in  acute distress, periorbital hematomas bilaterally  Cardiovascular: Regular rate and rhythm, S1/S2 + Respiratory: Clear to auscultation bilaterally, no wheezing, no crackles, no rhonchi Abdominal: Soft, non tender, non distended, bowel sounds +, no guarding Extremities: no cyanosis, pulses palpable bilaterally DP and PT Neuro: Grossly nonfocal  Discharge Instructions  Discharge Instructions    Call MD for:  difficulty breathing, headache or visual disturbances    Complete by:  As directed   Call MD for:  persistant dizziness or light-headedness    Complete by:  As directed   Call MD for:  persistant nausea and vomiting    Complete by:  As directed   Call MD for:  severe uncontrolled pain    Complete by:  As directed   Diet - low sodium heart healthy    Complete by:  As directed   Increase activity slowly    Complete by:  As directed       Medication List    STOP taking these medications   aspirin 81 MG tablet Replaced by:  aspirin 81 MG EC tablet     TAKE these medications   acetaminophen 325 MG tablet Commonly known as:  TYLENOL Take 650 mg by mouth every 4 (four) hours as needed for moderate pain.   aspirin 81 MG EC tablet Take 1 tablet (81 mg total) by mouth 2 (two) times daily with a meal. Replaces:  aspirin 81 MG tablet   CERTAVITE/ANTIOXIDANTS PO Take 1 tablet by mouth daily.   donepezil 10 MG tablet Commonly known as:  ARICEPT Take 10 mg by mouth at bedtime.   feeding supplement Liqd Take 1 Container by mouth 2 (two) times daily between meals.   FIBER-CAPS PO Take 1 capsule by mouth daily.   ipratropium-albuterol 0.5-2.5 (3) MG/3ML Soln Commonly known as:  DUONEB Take 3 mLs by nebulization 2 (two) times daily as needed (for Shortness of breath).   lisinopril 40 MG tablet Commonly known as:  PRINIVIL,ZESTRIL Take 40 mg by mouth daily. For HTN   memantine 10 MG tablet Commonly known as:  NAMENDA Take 10 mg by mouth 2 (two) times daily.   metoprolol  succinate 100 MG 24 hr tablet Commonly known as:  TOPROL-XL Take 100 mg by mouth daily. Take with or immediately following a meal.   sennosides-docusate sodium 8.6-50 MG tablet Commonly known as:  SENOKOT-S Take 1 tablet by mouth daily. Hold for loose stool       Follow-up Information    Schedule an appointment as soon as possible for a visit with Swinteck, Horald Pollen, MD.   Specialty:  Orthopedic Surgery Why:  For wound re-check Contact information: [address] [place] [address] [phone]            The results of significant diagnostics from this hospitalization (including imaging, microbiology, ancillary and laboratory) are listed below for reference.    Significant Diagnostic Studies: Ct Head Wo Contrast  Result Date: 08/21/2015 CLINICAL DATA:  Tripped and fell and chair today. Left supraorbital hematoma.  Personal history of head trauma. No loss of consciousness. The patient was on the floor for 30 minutes prior to receiving help. EXAM: CT HEAD WITHOUT CONTRAST CT MAXILLOFACIAL WITHOUT CONTRAST CT CERVICAL SPINE WITHOUT CONTRAST TECHNIQUE: Multidetector CT imaging of the head, cervical spine, and maxillofacial structures were performed using the standard protocol without intravenous contrast. Multiplanar CT image reconstructions of the cervical spine and maxillofacial structures were also generated. COMPARISON:  CT head and cervical spine 01/02/2013. FINDINGS: CT HEAD FINDINGS Moderate generalized atrophy and diffuse white matter disease is present. No acute infarct, hemorrhage, or mass lesion is present. The ventricles are proportionate to the degree of atrophy. No significant extra-axial fluid collection is present. Extensive atherosclerotic calcifications are present throughout the cavernous internal carotid arteries vertebrobasilar system. The paranasal sinuses and mastoid air cells are clear. A left supraorbital scalp hematoma measures 2.7 x 1.6 x 1.7  cm without an underlying fracture. CT MAXILLOFACIAL FINDINGS The left supraorbital scalp hematoma is again seen. There is no underlying fracture. The paranasal sinuses and mastoid air cells are clear. Bilateral lens replacements are present. The globes and orbits are otherwise intact. There is no significant postseptal inflammation on the left. Minimal infraorbital edema is present on the left. The mandible is intact and located. Torus mandibularis are noted bilaterally. Soft tissues of the face and muscles of mastication are otherwise within normal limits. Salivary glands are unremarkable. CT CERVICAL SPINE FINDINGS The cervical spine is imaged from the skull base through T2-3. Chronic loss of disc height is present at each level from C4-5 through C7-T1. There slight degenerative anterolisthesis at C4-5. Chronic endplate changes and uncovertebral spurring is present at these levels in particular. No acute fracture traumatic subluxation is present. Osseous foraminal narrowing is most pronounced at C5-6 and C6-7 bilaterally. There is heterogeneous enlargement of the thyroid bilaterally. No dominant nodule is present. The lung apices are clear. IMPRESSION: 1. Moderate atrophy and white matter disease without acute intracranial abnormality. 2. 2.7 x 1.6 x 1.7 cm left supraorbital scalp hematoma without underlying fracture or globe injury. 3. Minimal infraorbital soft tissue swelling without other significant facial trauma. 4. Moderate multilevel spondylosis of the cervical spine without evidence for acute fracture or traumatic subluxation. Electronically Signed   By: San Morelle M.D.   On: 08/21/2015 11:58  Ct Cervical Spine Wo Contrast  Result Date: 08/21/2015 CLINICAL DATA:  Tripped and fell and chair today. Left supraorbital hematoma. Personal history of head trauma. No loss of consciousness. The patient was on the floor for 30 minutes prior to receiving help. EXAM: CT HEAD WITHOUT CONTRAST CT  MAXILLOFACIAL WITHOUT CONTRAST CT CERVICAL SPINE WITHOUT CONTRAST TECHNIQUE: Multidetector CT imaging of the head, cervical spine, and maxillofacial structures were performed using the standard protocol without intravenous contrast. Multiplanar CT image reconstructions of the cervical spine and maxillofacial structures were also generated. COMPARISON:  CT head and cervical spine 01/02/2013. FINDINGS: CT HEAD FINDINGS Moderate generalized atrophy and diffuse white matter disease is present. No acute infarct, hemorrhage, or mass lesion is present. The ventricles are proportionate to the degree of atrophy. No significant extra-axial fluid collection is present. Extensive atherosclerotic calcifications are present throughout the cavernous internal carotid arteries vertebrobasilar system. The paranasal sinuses and mastoid air cells are clear. A left supraorbital scalp hematoma measures 2.7 x 1.6 x 1.7 cm without an underlying fracture. CT MAXILLOFACIAL FINDINGS The left supraorbital scalp hematoma is again seen. There is no underlying fracture. The paranasal sinuses and mastoid air cells are clear. Bilateral lens  replacements are present. The globes and orbits are otherwise intact. There is no significant postseptal inflammation on the left. Minimal infraorbital edema is present on the left. The mandible is intact and located. Torus mandibularis are noted bilaterally. Soft tissues of the face and muscles of mastication are otherwise within normal limits. Salivary glands are unremarkable. CT CERVICAL SPINE FINDINGS The cervical spine is imaged from the skull base through T2-3. Chronic loss of disc height is present at each level from C4-5 through C7-T1. There slight degenerative anterolisthesis at C4-5. Chronic endplate changes and uncovertebral spurring is present at these levels in particular. No acute fracture traumatic subluxation is present. Osseous foraminal narrowing is most pronounced at C5-6 and C6-7 bilaterally.  There is heterogeneous enlargement of the thyroid bilaterally. No dominant nodule is present. The lung apices are clear. IMPRESSION: 1. Moderate atrophy and white matter disease without acute intracranial abnormality. 2. 2.7 x 1.6 x 1.7 cm left supraorbital scalp hematoma without underlying fracture or globe injury. 3. Minimal infraorbital soft tissue swelling without other significant facial trauma. 4. Moderate multilevel spondylosis of the cervical spine without evidence for acute fracture or traumatic subluxation. Electronically Signed   By: San Morelle M.D.   On: 08/21/2015 11:58  Pelvis Portable  Result Date: 08/22/2015 CLINICAL DATA:  Post ORIF left hip fracture. EXAM: PORTABLE PELVIS 1-2 VIEWS COMPARISON:  Preoperative radiographs yesterday FINDINGS: Interval intra medullary nail and like screw fixation of comminuted intertrochanteric left hip fracture. Alignment is improved from preoperative exam. No apparent immediate postoperative complication. No additional acute fracture of the bony pelvis. IMPRESSION: Post ORIF intertrochanteric left hip fracture. No immediate postoperative complication. Electronically Signed   By: Jeb Levering M.D.   On: 08/22/2015 23:53   Dg Chest Portable 1 View  Result Date: 08/21/2015 CLINICAL DATA:  Unwitnessed fall from chair today.  Hip fracture. EXAM: PORTABLE CHEST 1 VIEW COMPARISON:  Two-view chest x-ray 01/02/2013 FINDINGS: The heart size is normal. Lung volumes remain low. Atherosclerotic changes are noted at the aortic arch. Chronic interstitial changes are stable. No acute fractures are present.  There is no pneumothorax. IMPRESSION: 1. No acute cardiopulmonary disease or significant interval change. 2. Stable chronic interstitial coarsening. 3. Atherosclerosis of the thoracic aorta. Electronically Signed   By: San Morelle M.D.   On: 08/21/2015 12:17  Dg C-arm 1-60 Min  Result Date: 08/22/2015 CLINICAL DATA:  ORIF left hip fracture. EXAM:  DG C-ARM 61-120 MIN; LEFT FEMUR 2 VIEWS COMPARISON:  Preoperative radiographs. FINDINGS: Four fluoroscopic spot images from the operating room post fixation of intertrochanteric left hip fracture demonstrate intra medullary femoral nail with distal locking and proximal lag screws. The fracture is in improved alignment. Total fluoroscopy time 1 minutes 15 seconds. IMPRESSION: Intraoperative fluoroscopy during intertrochanteric left hip fracture ORIF. Electronically Signed   By: Jeb Levering M.D.   On: 08/22/2015 23:51   Dg Hip Unilat W Or Wo Pelvis 2-3 Views Left  Result Date: 08/21/2015 CLINICAL DATA:  Fall at home.  Left hip pain. EXAM: DG HIP (WITH OR WITHOUT PELVIS) 2-3V LEFT COMPARISON:  None. FINDINGS: There is a left femoral intertrochanteric fracture with mild displacement. Slight varus angulation. No subluxation or dislocation. Mild degenerative changes in the hips bilaterally. IMPRESSION: Left femoral intertrochanteric fracture with mild displacement and varus angulation. Electronically Signed   By: Rolm Baptise M.D.   On: 08/21/2015 11:29  Dg Femur Min 2 Views Left  Result Date: 08/22/2015 CLINICAL DATA:  ORIF left hip fracture. EXAM: DG C-ARM 61-120  MIN; LEFT FEMUR 2 VIEWS COMPARISON:  Preoperative radiographs. FINDINGS: Four fluoroscopic spot images from the operating room post fixation of intertrochanteric left hip fracture demonstrate intra medullary femoral nail with distal locking and proximal lag screws. The fracture is in improved alignment. Total fluoroscopy time 1 minutes 15 seconds. IMPRESSION: Intraoperative fluoroscopy during intertrochanteric left hip fracture ORIF. Electronically Signed   By: Jeb Levering M.D.   On: 08/22/2015 23:51   Dg Femur Min 2 Views Left  Result Date: 08/21/2015 CLINICAL DATA:  Unwitnessed fall. Patient complains of left hip pain. EXAM: LEFT FEMUR 2 VIEWS COMPARISON:  Hip radiograph 08/21/2015 FINDINGS: Re- demonstrated is left femoral  intertrochanteric oblique fracture with mild displacement and varus angulation of the distal fracture fragment. There is generalized osteopenia. No distal femoral fractures are seen. IMPRESSION: Known left femoral intertrochanteric fracture with mild displacement and varus angulation. Osteopenia. Electronically Signed   By: Fidela Salisbury M.D.   On: 08/21/2015 13:11  Dg Femur Port Min 2 Views Left  Result Date: 08/22/2015 CLINICAL DATA:  Status post IM nail left femur. EXAM: LEFT FEMUR PORTABLE 2 VIEWS COMPARISON:  Preoperative radiographs yesterday. FINDINGS: Intra medullary nail with distal locking and proximal lag screw traverses intertrochanteric left hip fracture. Fracture is in improved alignment. Recent postsurgical change includes air in the subcutaneous soft tissues. No immediate complication is seen. IMPRESSION: ORIF of intertrochanteric left hip fracture in improved alignment. Electronically Signed   By: Jeb Levering M.D.   On: 08/22/2015 23:49   Ct Maxillofacial Wo Contrast  Result Date: 08/21/2015 CLINICAL DATA:  Tripped and fell and chair today. Left supraorbital hematoma. Personal history of head trauma. No loss of consciousness. The patient was on the floor for 30 minutes prior to receiving help. EXAM: CT HEAD WITHOUT CONTRAST CT MAXILLOFACIAL WITHOUT CONTRAST CT CERVICAL SPINE WITHOUT CONTRAST TECHNIQUE: Multidetector CT imaging of the head, cervical spine, and maxillofacial structures were performed using the standard protocol without intravenous contrast. Multiplanar CT image reconstructions of the cervical spine and maxillofacial structures were also generated. COMPARISON:  CT head and cervical spine 01/02/2013. FINDINGS: CT HEAD FINDINGS Moderate generalized atrophy and diffuse white matter disease is present. No acute infarct, hemorrhage, or mass lesion is present. The ventricles are proportionate to the degree of atrophy. No significant extra-axial fluid collection is present.  Extensive atherosclerotic calcifications are present throughout the cavernous internal carotid arteries vertebrobasilar system. The paranasal sinuses and mastoid air cells are clear. A left supraorbital scalp hematoma measures 2.7 x 1.6 x 1.7 cm without an underlying fracture. CT MAXILLOFACIAL FINDINGS The left supraorbital scalp hematoma is again seen. There is no underlying fracture. The paranasal sinuses and mastoid air cells are clear. Bilateral lens replacements are present. The globes and orbits are otherwise intact. There is no significant postseptal inflammation on the left. Minimal infraorbital edema is present on the left. The mandible is intact and located. Torus mandibularis are noted bilaterally. Soft tissues of the face and muscles of mastication are otherwise within normal limits. Salivary glands are unremarkable. CT CERVICAL SPINE FINDINGS The cervical spine is imaged from the skull base through T2-3. Chronic loss of disc height is present at each level from C4-5 through C7-T1. There slight degenerative anterolisthesis at C4-5. Chronic endplate changes and uncovertebral spurring is present at these levels in particular. No acute fracture traumatic subluxation is present. Osseous foraminal narrowing is most pronounced at C5-6 and C6-7 bilaterally. There is heterogeneous enlargement of the thyroid bilaterally. No dominant nodule is present. The lung apices  are clear. IMPRESSION: 1. Moderate atrophy and white matter disease without acute intracranial abnormality. 2. 2.7 x 1.6 x 1.7 cm left supraorbital scalp hematoma without underlying fracture or globe injury. 3. Minimal infraorbital soft tissue swelling without other significant facial trauma. 4. Moderate multilevel spondylosis of the cervical spine without evidence for acute fracture or traumatic subluxation. Electronically Signed   By: San Morelle M.D.   On: 08/21/2015 11:58   Microbiology: Recent Results (from the past 240 hour(s))   Surgical pcr screen     Status: None   Collection Time: 08/21/15  4:11 PM  Result Value Ref Range Status   MRSA, PCR NEGATIVE NEGATIVE Final   Staphylococcus aureus NEGATIVE NEGATIVE Final    Comment:        The Xpert SA Assay (FDA approved for NASAL specimens in patients over 79 years of age), is one component of a comprehensive surveillance program.  Test performance has been validated by [facility] for patients greater than or equal to 77 year old. It is not intended to diagnose infection nor to guide or monitor treatment.   Surgical PCR screen     Status: None   Collection Time: 08/22/15  7:37 PM  Result Value Ref Range Status   MRSA, PCR NEGATIVE NEGATIVE Final   Staphylococcus aureus NEGATIVE NEGATIVE Final    Comment:        The Xpert SA Assay (FDA approved for NASAL specimens in patients over 19 years of age), is one component of a comprehensive surveillance program.  Test performance has been validated by [facility] for patients greater than or equal to 58 year old. It is not intended to diagnose infection nor to guide or monitor treatment.      Labs: Basic Metabolic Panel:  Recent Labs Lab 08/21/15 1209 08/22/15 0429 08/22/15 1020 08/23/15 0514 08/23/15 0730 08/24/15 0543  NA 141 137  --  137  --  137  K 3.1* 4.3  --  3.0*  --  3.7  CL 105 107  --  106  --  107  CO2 26 23  --  21*  --  23  GLUCOSE 137* 105*  --  157*  --  145*  BUN 11 13  --  11  --  19  CREATININE 0.60 0.66  --  0.86  --  0.76  CALCIUM 9.4 8.5*  --  8.2*  --  8.4*  MG  --   --  1.6*  --  1.8  --    Liver Function Tests:  Recent Labs Lab 08/21/15 1209 08/22/15 0429  AST 20 15  ALT 17 13*  ALKPHOS 66 51  BILITOT 0.7 1.2  PROT 7.2 5.9*  ALBUMIN 4.3 3.6   No results for input(s): LIPASE, AMYLASE in the last 168 hours. No results for input(s): AMMONIA in the last 168 hours. CBC:  Recent Labs Lab 08/21/15 1209 08/22/15 0429 08/23/15 0514 08/24/15 0543  WBC  12.0* 8.0 13.5* 8.9  NEUTROABS 10.6* 5.4  --   --   HGB 13.9 11.5* 9.0* 7.9*  HCT 43.0 34.6* 28.0* 24.3*  MCV 84.5 83.6 84.6 83.2  PLT 177 160 131* 122*   Cardiac Enzymes:  Recent Labs Lab 08/21/15 1827 08/21/15 2257 08/22/15 0429 08/22/15 1020  TROPONINI 0.13* 0.16* 0.26* 0.29*   BNP: BNP (last 3 results) No results for input(s): BNP in the last 8760 hours.  ProBNP (last 3 results) No results for input(s): PROBNP in the last 8760 hours.  CBG:  Recent Labs Lab 08/22/15 2256  GLUCAP 98

## 2015-08-24 NOTE — Progress Notes (Signed)
   Subjective:  Patient reports pain as mild to moderate.  No c/o. Sitting up and eating breakfast.  Objective:   VITALS:   Vitals:   08/23/15 0953 08/23/15 1355 08/23/15 2127 08/24/15 0518  BP: (!) 114/58 (!) 114/46 (!) 117/50 (!) 90/43  Pulse: 79 73 75 (!) 57  Resp: 18 17 16 16   Temp: 98 F (36.7 C) 98.4 F (36.9 C) 97.8 F (36.6 C) 97.6 F (36.4 C)  TempSrc: Oral Oral Oral Oral  SpO2: 98% 92% 94% 94%  Weight:      Height:        ABD soft Sensation intact distally Intact pulses distally Dorsiflexion/Plantar flexion intact Incision: dressing C/D/I Compartment soft   Lab Results  Component Value Date   WBC 8.9 08/24/2015   HGB 7.9 (L) 08/24/2015   HCT 24.3 (L) 08/24/2015   MCV 83.2 08/24/2015   PLT 122 (L) 08/24/2015   BMET    Component Value Date/Time   NA 137 08/24/2015 0543   NA 143 05/05/2015   K 3.7 08/24/2015 0543   CL 107 08/24/2015 0543   CO2 23 08/24/2015 0543   GLUCOSE 145 (H) 08/24/2015 0543   BUN 19 08/24/2015 0543   BUN 14 05/05/2015   CREATININE 0.76 08/24/2015 0543   CALCIUM 8.4 (L) 08/24/2015 0543   GFRNONAA >60 08/24/2015 0543   GFRAA >60 08/24/2015 0543     Assessment/Plan: 2 Days Post-Op   Principal Problem:   Closed left hip fracture (H[facility]) Active Problems:   Dementia   HTN (hypertension)   Hypokalemia   Type 2 diabetes, controlled, with neuropathy (HCC)   Facial contusion   NSTEMI (non-ST elevated myocardial infarction) ([place])   Displaced intertrochanteric fracture of left femur, initial encounter for closed fracture (HCC)   Abrasion   Postoperative anemia due to acute blood loss   WBAT with walker DVT ppx: ASA 81 mg PO BID x6 weeks, SCDs, TEDs PO pain control: avoid narcotics PT/OT D/C planning    Emmert Roethler, Horald Pollen 08/24/2015, 7:52 AM   Rod Can, MD Cell [phone]

## 2015-08-24 NOTE — Progress Notes (Signed)
Pt stable for d/c to SNF today after 1 unit PRBC transfused. Hgb up to 9.5 post transfusion. Report was called to [facility] at [facility], all questions answered. Belongings packed, sent with daughter. Peripheral IVs removed. Waiting for transportation to facility via [place].   [place], Jerry Caras

## 2015-08-24 NOTE — Clinical Social Work Note (Signed)
CSW facilitated patient discharge including contacting patient family and facility to confirm patient discharge plans. Clinical information faxed to facility and family agreeable with plan. CSW arranged ambulance transport via [facility] to [facility]. RN to call report prior to discharge ([phone]).  CSW will sign off for now as social work intervention is no longer needed. Please consult us again if new needs arise.  Shyniece Scripter, CSW [phone]  

## 2015-08-24 NOTE — Clinical Social Work Note (Addendum)
CSW faxed clinicals to [facility] for insurance authorization. Per MD, patient discharging today. SNF aware.  Dayton Scrape, [place] [phone]  10:58 am Insurance authorization #: [id number]. RUG: [id number].  Dayton Scrape, [place] [phone]  11:31 am [name] daughter notified that patient is discharging to [facility] today. Patient getting blood now and should be ready for transport around 3:00 pm. RN will call CSW when [facility] can be scheduled.  Dayton Scrape, [place]

## 2015-08-24 NOTE — Discharge Instructions (Signed)
 Dr. Brian Swinteck Adult Hip & Knee Specialist [facility] [address] [place],  [address] [phone]   POSTOPERATIVE DIRECTIONS    Hip Rehabilitation, Guidelines Following Surgery   WEIGHT BEARING Weight bearing as tolerated with assist device (walker, cane, etc) as directed, use it as long as suggested by your surgeon or therapist, typically at least 4-6 weeks.   HOME CARE INSTRUCTIONS  Remove items at home which could result in a fall. This includes throw rugs or furniture in walking pathways.  Continue medications as instructed at time of discharge.  You may have some home medications which will be placed on hold until you complete the course of blood thinner medication.  4 days after discharge, you may start showering. No tub baths or soaking your incisions. Do not put on socks or shoes without following the instructions of your caregivers.   Sit on chairs with arms. Use the chair arms to help push yourself up when arising.  Arrange for the use of a toilet seat elevator so you are not sitting low.   Walk with walker as instructed.  You may resume a sexual relationship in one month or when given the OK by your caregiver.  Use walker as long as suggested by your caregivers.  Avoid periods of inactivity such as sitting longer than an hour when not asleep. This helps prevent blood clots.  You may return to work once you are cleared by your surgeon.  Do not drive a car for 6 weeks or until released by your surgeon.  Do not drive while taking narcotics.  Wear elastic stockings for two weeks following surgery during the day but you may remove then at night.  Make sure you keep all of your appointments after your operation with all of your doctors and caregivers. You should call the office at the above phone number and make an appointment for approximately two weeks after the date of your surgery. Please pick up a stool softener and laxative  for home use as long as you are requiring pain medications.  ICE to the affected hip every three hours for 30 minutes at a time and then as needed for pain and swelling. Continue to use ice on the hip for pain and swelling from surgery. You may notice swelling that will progress down to the foot and ankle.  This is normal after surgery.  Elevate the leg when you are not up walking on it.   It is important for you to complete the blood thinner medication as prescribed by your doctor.  Continue to use the breathing machine which will help keep your temperature down.  It is common for your temperature to cycle up and down following surgery, especially at night when you are not up moving around and exerting yourself.  The breathing machine keeps your lungs expanded and your temperature down.  RANGE OF MOTION AND STRENGTHENING EXERCISES  These exercises are designed to help you keep full movement of your hip joint. Follow your caregiver's or physical therapist's instructions. Perform all exercises about fifteen times, three times per day or as directed. Exercise both hips, even if you have had only one joint replacement. These exercises can be done on a training (exercise) mat, on the floor, on a table or on a bed. Use whatever works the best and is most comfortable for you. Use music or television while you are exercising so that the exercises are a pleasant break in your day. This   will make your life better with the exercises acting as a break in routine you can look forward to.  Lying on your back, slowly slide your foot toward your buttocks, raising your knee up off the floor. Then slowly slide your foot back down until your leg is straight again.  Lying on your back spread your legs as far apart as you can without causing discomfort.  Lying on your side, raise your upper leg and foot straight up from the floor as far as is comfortable. Slowly lower the leg and repeat.  Lying on your back, tighten up the  muscle in the front of your thigh (quadriceps muscles). You can do this by keeping your leg straight and trying to raise your heel off the floor. This helps strengthen the largest muscle supporting your knee.  Lying on your back, tighten up the muscles of your buttocks both with the legs straight and with the knee bent at a comfortable angle while keeping your heel on the floor.   SKILLED REHAB INSTRUCTIONS: If the patient is transferred to a skilled rehab facility following release from the hospital, a list of the current medications will be sent to the facility for the patient to continue.  When discharged from the skilled rehab facility, please have the facility set up the patient's Home Health Physical Therapy prior to being released. Also, the skilled facility will be responsible for providing the patient with their medications at time of release from the facility to include their pain medication and their blood thinner medication. If the patient is still at the rehab facility at time of the two week follow up appointment, the skilled rehab facility will also need to assist the patient in arranging follow up appointment in our office and any transportation needs.  MAKE SURE YOU:  Understand these instructions.  Will watch your condition.  Will get help right away if you are not doing well or get worse.  Pick up stool softner and laxative for home use following surgery while on pain medications. Daily dry dressing changes as needed. In 4 days, you may remove your dressings and begin taking showers - no tub baths or soaking the incisions. Continue to use ice for pain and swelling after surgery. Do not use any lotions or creams on the incision until instructed by your surgeon.   

## 2015-08-25 ENCOUNTER — Encounter: Payer: Self-pay | Admitting: Nurse Practitioner

## 2015-08-25 ENCOUNTER — Non-Acute Institutional Stay (SKILLED_NURSING_FACILITY): Payer: Medicare Other | Admitting: Nurse Practitioner

## 2015-08-25 DIAGNOSIS — D62 Acute posthemorrhagic anemia: Secondary | ICD-10-CM | POA: Diagnosis not present

## 2015-08-25 DIAGNOSIS — S72002S Fracture of unspecified part of neck of left femur, sequela: Secondary | ICD-10-CM | POA: Diagnosis not present

## 2015-08-25 DIAGNOSIS — I10 Essential (primary) hypertension: Secondary | ICD-10-CM

## 2015-08-25 DIAGNOSIS — F0390 Unspecified dementia without behavioral disturbance: Secondary | ICD-10-CM

## 2015-08-25 DIAGNOSIS — S0083XS Contusion of other part of head, sequela: Secondary | ICD-10-CM

## 2015-08-25 DIAGNOSIS — I214 Non-ST elevation (NSTEMI) myocardial infarction: Secondary | ICD-10-CM | POA: Diagnosis not present

## 2015-08-25 DIAGNOSIS — E1140 Type 2 diabetes mellitus with diabetic neuropathy, unspecified: Secondary | ICD-10-CM

## 2015-08-25 DIAGNOSIS — K5900 Constipation, unspecified: Secondary | ICD-10-CM | POA: Diagnosis not present

## 2015-08-25 DIAGNOSIS — E0841 Diabetes mellitus due to underlying condition with diabetic mononeuropathy: Secondary | ICD-10-CM | POA: Diagnosis not present

## 2015-08-25 DIAGNOSIS — N318 Other neuromuscular dysfunction of bladder: Secondary | ICD-10-CM | POA: Diagnosis not present

## 2015-08-25 LAB — TYPE AND SCREEN
ABO/RH(D): B POS
Antibody Screen: NEGATIVE
Unit division: 0

## 2015-08-25 NOTE — Assessment & Plan Note (Signed)
Last Hgb A1c 6.2 10/28/14, Hgb a1c 6.2 04/2015, diet controlled

## 2015-08-25 NOTE — Assessment & Plan Note (Signed)
controlled, continue Lisinopril 40mg , Metoprolol succinate 100mg  update CMP

## 2015-08-25 NOTE — Assessment & Plan Note (Signed)
No angina since SNF admission, continue ASA bid

## 2015-08-25 NOTE — Assessment & Plan Note (Signed)
S/p 1 unit RRBC transfusion, update CBC, may consider dc ASA if Hgb drops.

## 2015-08-25 NOTE — Assessment & Plan Note (Addendum)
Left hip, incision intact, f/u Ortho, continue ASA 325mg  bid x 6 weeks. Adding Tramadol q6h prn for pain in addition to Tylenol.

## 2015-08-25 NOTE — Assessment & Plan Note (Signed)
Should heal.

## 2015-08-25 NOTE — Assessment & Plan Note (Signed)
Last Hgb A1c 6.2 10/28/14, Hgb a1c 6.2 04/2015, diet controlled, update Hgb a1c

## 2015-08-25 NOTE — Assessment & Plan Note (Signed)
Managed with adult depends. Off Myrbetriq.

## 2015-08-25 NOTE — Assessment & Plan Note (Signed)
Gradual decline, continue Namenda and Aricept, SNF for care needs.

## 2015-08-25 NOTE — Assessment & Plan Note (Signed)
Stable, contineu Fiber Laxative daily, Senna S I bid.

## 2015-08-25 NOTE — Progress Notes (Signed)
Location:   [facility] Room Number: 31 Place of Service:  SNF (31) Provider:  Orvetta Danielski  NP    Patient Care Team: Estill Dooms, MD as PCP - General (Internal Medicine) Khylah Kendra Otho Darner, NP as Nurse Practitioner (Internal Medicine)  Extended Emergency Contact Information Primary Emergency Contact: Hanzaker,[name] Address: [address]          [place], [place] [address] [place] of [place] Phone: [phone] Mobile Phone: [phone] Relation: Daughter Secondary Emergency [place]: [place] [address] [place] Phone: [phone] Relation: Grandson  Code Status:  DNR Goals of care: Advanced Directive information Advanced Directives 08/25/2015  Does patient have an advance directive? Yes  Type of Advance Directive Out of facility DNR (pink MOST or yellow form)  Does patient want to make changes to advanced directive? No - Patient declined  Copy of advanced directive(s) in chart? Yes  Pre-existing out of facility DNR order (yellow form or pink MOST form) -     Chief Complaint  Patient presents with  . Hospitalization Follow-up    HPI:  Pt is a 82 y.o. female seen today for a hospital f/u s/p admission from s/p hospitalization 08/21/15 to 08/24/15 for left hip fx, s/p ORIF 08/22/15, ASA bid x 6 weeks,  facial contusion sustained from a mechanical fall at [facility], possible NSTEMI due to elevated troponin.  Acute blood loss anemia, Hgb 7.9, s/p I unit of PRBC.   Hx of HTN, well controlled, continue Lisiniopril and Metoprolol. T2DM, controlled.   Past Medical History:  Diagnosis Date  . Atrial fibrillation ([place])   . Bradycardia   . Dementia   . Depression   . Diabetes mellitus without complication ([place])   . Fall at nursing home   . Glaucoma   . HOH (hard of hearing)   . Hypertension   . Lower back pain   . Melanoma ([place]) 1960   left shoulder skin  . Muscle pain   . Peripheral neuropathy ([place])   . TBI  (traumatic brain injury) ([place])   . Umbilical hernia   . Wears glasses    Past Surgical History:  Procedure Laterality Date  . INTRAMEDULLARY (IM) NAIL INTERTROCHANTERIC Left 08/22/2015   Procedure: INTRAMEDULLARY (IM) NAIL INTERTROCHANTRIC;  Surgeon: Rod Can, MD;  Location: [facility];  Service: Orthopedics;  Laterality: Left;  . SKIN CANCER EXCISION Left 1960's   melanoma shoulder    Allergies  Allergen Reactions  . Codeine Other (See Comments)    Per [facility]      Medication List       Accurate as of 08/25/15  5:04 PM. Always use your most recent med list.          acetaminophen 325 MG tablet Commonly known as:  TYLENOL Take 650 mg by mouth every 4 (four) hours as needed for moderate pain.   aspirin 81 MG EC tablet Take 1 tablet (81 mg total) by mouth 2 (two) times daily with a meal.   CERTAVITE/ANTIOXIDANTS PO Take 1 tablet by mouth daily.   donepezil 10 MG tablet Commonly known as:  ARICEPT Take 10 mg by mouth at bedtime.   feeding supplement Liqd Take 1 Container by mouth 2 (two) times daily between meals.   FIBER-CAPS PO Take 1 capsule by mouth daily.   ipratropium-albuterol 0.5-2.5 (3) MG/3ML Soln Commonly known as:  DUONEB Take 3 mLs by nebulization 2 (two) times daily  as needed (for Shortness of breath).   lisinopril 40 MG tablet Commonly known as:  PRINIVIL,ZESTRIL Take 40 mg by mouth daily. For HTN   memantine 10 MG tablet Commonly known as:  NAMENDA Take 10 mg by mouth 2 (two) times daily.   metoprolol succinate 100 MG 24 hr tablet Commonly known as:  TOPROL-XL Take 100 mg by mouth daily. Take with or immediately following a meal.   sennosides-docusate sodium 8.6-50 MG tablet Commonly known as:  SENOKOT-S Take 1 tablet by mouth daily. Hold for loose stool       Review of Systems  Constitutional: Positive for fatigue.  HENT: Negative.   Eyes: Positive for visual disturbance (corrective lenses).  Respiratory: Negative.   Cardiovascular:  Negative for chest pain, palpitations and leg swelling.  Gastrointestinal: Negative.   Endocrine: Positive for polyuria.       Diabetic with neuropathy  Genitourinary: Positive for frequency. Negative for dysuria and flank pain.       Episodes of incontinence  Musculoskeletal: Positive for gait problem.  Skin: Negative.        Facial ecchymoses, right eye brown hematoma.   Allergic/Immunologic: Negative.   Neurological: Negative for tremors, seizures, syncope, facial asymmetry, speech difficulty, weakness, light-headedness, numbness and headaches.       Dementia  Psychiatric/Behavioral: Positive for confusion and sleep disturbance.    Immunization History  Administered Date(s) Administered  . Influenza-Unspecified 11/10/2012, 10/27/2013, 10/28/2014  . PPD Test 11/17/2011, 11/16/2014  . Pneumococcal Polysaccharide-23 11/23/1999  . Td 10/22/2000   Pertinent  Health Maintenance Due  Topic Date Due  . OPHTHALMOLOGY EXAM  09/24/1930  . DEXA SCAN  09/23/1985  . PNA vac Low Risk Adult (2 of 2 - PCV13) 11/22/2000  . HEMOGLOBIN A1C  04/28/2015  . INFLUENZA VACCINE  08/23/2015  . FOOT EXAM  10/26/2015   Fall Risk  11/29/2014 11/09/2014 10/26/2014 10/27/2013 10/27/2013  Falls in the past year? No No No No No  Risk for fall due to : - - - Impaired mobility;Impaired balance/gait;Mental status change -   Functional Status Survey:    Vitals:   08/25/15 1505  BP: (!) 141/89  Pulse: 87  Resp: 18  Temp: 98.9 F (37.2 C)  Weight: 119 lb (54 kg)  Height: 5\' 1"  (1.549 m)   Body mass index is 22.48 kg/m. Physical Exam  Constitutional: She appears well-developed and well-nourished.  HENT:  Head: Normocephalic and atraumatic.  Right Ear: External ear normal.  Left Ear: External ear normal.  Eyes: Conjunctivae and EOM are normal. Pupils are equal, round, and reactive to light.  Neck: Neck supple. No JVD present. No tracheal deviation present. No thyromegaly present.  Cardiovascular:  Normal rate.   Murmur heard.  Systolic murmur is present with a grade of 2/6  Pulmonary/Chest: Effort normal and breath sounds normal. No respiratory distress. She has no rales.  Abdominal: Soft. Bowel sounds are normal. She exhibits no mass. There is no tenderness.  Musculoskeletal: She exhibits no edema or tenderness.  Unstable gait. Using rolling walker. Mild scoliosis of thoracic and lumbar spine.  Lymphadenopathy:    She has no cervical adenopathy.  Neurological: She is alert. She has normal reflexes. No cranial nerve deficit. She exhibits normal muscle tone. Coordination normal.  Demented  Skin: Skin is warm and dry. No rash noted. No erythema.  Facial ecchymoses, right eye brown hematoma. Left hip s/p ORIF, incision intact.    Psychiatric: She has a normal mood and affect. Her behavior is normal.  Thought content normal. Her speech is not delayed and not slurred. She is not agitated, not aggressive, not hyperactive, not slowed, not withdrawn, not actively hallucinating and not combative. Thought content is not paranoid and not delusional. Cognition and memory are impaired. She exhibits abnormal recent memory.    Labs reviewed:  Recent Labs  08/22/15 0429 08/22/15 1020 08/23/15 0514 08/23/15 0730 08/24/15 0543  NA 137  --  137  --  137  K 4.3  --  3.0*  --  3.7  CL 107  --  106  --  107  CO2 23  --  21*  --  23  GLUCOSE 105*  --  157*  --  145*  BUN 13  --  11  --  19  CREATININE 0.66  --  0.86  --  0.76  CALCIUM 8.5*  --  8.2*  --  8.4*  MG  --  1.6*  --  1.8  --     Recent Labs  05/05/15 08/21/15 1209 08/22/15 0429  AST 15 20 15   ALT 13 17 13*  ALKPHOS 52 66 51  BILITOT  --  0.7 1.2  PROT  --  7.2 5.9*  ALBUMIN  --  4.3 3.6    Recent Labs  08/21/15 1209 08/22/15 0429 08/23/15 0514 08/24/15 0543 08/24/15 1433  WBC 12.0* 8.0 13.5* 8.9 10.8*  NEUTROABS 10.6* 5.4  --   --   --   HGB 13.9 11.5* 9.0* 7.9* 9.5*  HCT 43.0 34.6* 28.0* 24.3* 29.4*  MCV 84.5  83.6 84.6 83.2 85.0  PLT 177 160 131* 122* 119*   Lab Results  Component Value Date   TSH 0.88 05/05/2015   Lab Results  Component Value Date   HGBA1C 6.2 (A) 10/28/2014   Lab Results  Component Value Date   CHOL 191 05/20/2012   HDL 38 (L) 05/20/2012   LDLCALC 118 (H) 05/20/2012   TRIG 174 (H) 05/20/2012   CHOLHDL 5.0 05/20/2012    Significant Diagnostic Results in last 30 days:  Ct Head Wo Contrast  Result Date: 08/21/2015 CLINICAL DATA:  Tripped and fell and chair today. Left supraorbital hematoma. Personal history of head trauma. No loss of consciousness. The patient was on the floor for 30 minutes prior to receiving help. EXAM: CT HEAD WITHOUT CONTRAST CT MAXILLOFACIAL WITHOUT CONTRAST CT CERVICAL SPINE WITHOUT CONTRAST TECHNIQUE: Multidetector CT imaging of the head, cervical spine, and maxillofacial structures were performed using the standard protocol without intravenous contrast. Multiplanar CT image reconstructions of the cervical spine and maxillofacial structures were also generated. COMPARISON:  CT head and cervical spine 01/02/2013. FINDINGS: CT HEAD FINDINGS Moderate generalized atrophy and diffuse white matter disease is present. No acute infarct, hemorrhage, or mass lesion is present. The ventricles are proportionate to the degree of atrophy. No significant extra-axial fluid collection is present. Extensive atherosclerotic calcifications are present throughout the cavernous internal carotid arteries vertebrobasilar system. The paranasal sinuses and mastoid air cells are clear. A left supraorbital scalp hematoma measures 2.7 x 1.6 x 1.7 cm without an underlying fracture. CT MAXILLOFACIAL FINDINGS The left supraorbital scalp hematoma is again seen. There is no underlying fracture. The paranasal sinuses and mastoid air cells are clear. Bilateral lens replacements are present. The globes and orbits are otherwise intact. There is no significant postseptal inflammation on the left.  Minimal infraorbital edema is present on the left. The mandible is intact and located. Torus mandibularis are noted bilaterally. Soft tissues of the face  and muscles of mastication are otherwise within normal limits. Salivary glands are unremarkable. CT CERVICAL SPINE FINDINGS The cervical spine is imaged from the skull base through T2-3. Chronic loss of disc height is present at each level from C4-5 through C7-T1. There slight degenerative anterolisthesis at C4-5. Chronic endplate changes and uncovertebral spurring is present at these levels in particular. No acute fracture traumatic subluxation is present. Osseous foraminal narrowing is most pronounced at C5-6 and C6-7 bilaterally. There is heterogeneous enlargement of the thyroid bilaterally. No dominant nodule is present. The lung apices are clear. IMPRESSION: 1. Moderate atrophy and white matter disease without acute intracranial abnormality. 2. 2.7 x 1.6 x 1.7 cm left supraorbital scalp hematoma without underlying fracture or globe injury. 3. Minimal infraorbital soft tissue swelling without other significant facial trauma. 4. Moderate multilevel spondylosis of the cervical spine without evidence for acute fracture or traumatic subluxation. Electronically Signed   By: San Morelle M.D.   On: 08/21/2015 11:58  Ct Cervical Spine Wo Contrast  Result Date: 08/21/2015 CLINICAL DATA:  Tripped and fell and chair today. Left supraorbital hematoma. Personal history of head trauma. No loss of consciousness. The patient was on the floor for 30 minutes prior to receiving help. EXAM: CT HEAD WITHOUT CONTRAST CT MAXILLOFACIAL WITHOUT CONTRAST CT CERVICAL SPINE WITHOUT CONTRAST TECHNIQUE: Multidetector CT imaging of the head, cervical spine, and maxillofacial structures were performed using the standard protocol without intravenous contrast. Multiplanar CT image reconstructions of the cervical spine and maxillofacial structures were also generated. COMPARISON:   CT head and cervical spine 01/02/2013. FINDINGS: CT HEAD FINDINGS Moderate generalized atrophy and diffuse white matter disease is present. No acute infarct, hemorrhage, or mass lesion is present. The ventricles are proportionate to the degree of atrophy. No significant extra-axial fluid collection is present. Extensive atherosclerotic calcifications are present throughout the cavernous internal carotid arteries vertebrobasilar system. The paranasal sinuses and mastoid air cells are clear. A left supraorbital scalp hematoma measures 2.7 x 1.6 x 1.7 cm without an underlying fracture. CT MAXILLOFACIAL FINDINGS The left supraorbital scalp hematoma is again seen. There is no underlying fracture. The paranasal sinuses and mastoid air cells are clear. Bilateral lens replacements are present. The globes and orbits are otherwise intact. There is no significant postseptal inflammation on the left. Minimal infraorbital edema is present on the left. The mandible is intact and located. Torus mandibularis are noted bilaterally. Soft tissues of the face and muscles of mastication are otherwise within normal limits. Salivary glands are unremarkable. CT CERVICAL SPINE FINDINGS The cervical spine is imaged from the skull base through T2-3. Chronic loss of disc height is present at each level from C4-5 through C7-T1. There slight degenerative anterolisthesis at C4-5. Chronic endplate changes and uncovertebral spurring is present at these levels in particular. No acute fracture traumatic subluxation is present. Osseous foraminal narrowing is most pronounced at C5-6 and C6-7 bilaterally. There is heterogeneous enlargement of the thyroid bilaterally. No dominant nodule is present. The lung apices are clear. IMPRESSION: 1. Moderate atrophy and white matter disease without acute intracranial abnormality. 2. 2.7 x 1.6 x 1.7 cm left supraorbital scalp hematoma without underlying fracture or globe injury. 3. Minimal infraorbital soft tissue  swelling without other significant facial trauma. 4. Moderate multilevel spondylosis of the cervical spine without evidence for acute fracture or traumatic subluxation. Electronically Signed   By: San Morelle M.D.   On: 08/21/2015 11:58  Pelvis Portable  Result Date: 08/22/2015 CLINICAL DATA:  Post ORIF left hip fracture. EXAM:  PORTABLE PELVIS 1-2 VIEWS COMPARISON:  Preoperative radiographs yesterday FINDINGS: Interval intra medullary nail and like screw fixation of comminuted intertrochanteric left hip fracture. Alignment is improved from preoperative exam. No apparent immediate postoperative complication. No additional acute fracture of the bony pelvis. IMPRESSION: Post ORIF intertrochanteric left hip fracture. No immediate postoperative complication. Electronically Signed   By: Jeb Levering M.D.   On: 08/22/2015 23:53   Dg Chest Portable 1 View  Result Date: 08/21/2015 CLINICAL DATA:  Unwitnessed fall from chair today.  Hip fracture. EXAM: PORTABLE CHEST 1 VIEW COMPARISON:  Two-view chest x-ray 01/02/2013 FINDINGS: The heart size is normal. Lung volumes remain low. Atherosclerotic changes are noted at the aortic arch. Chronic interstitial changes are stable. No acute fractures are present.  There is no pneumothorax. IMPRESSION: 1. No acute cardiopulmonary disease or significant interval change. 2. Stable chronic interstitial coarsening. 3. Atherosclerosis of the thoracic aorta. Electronically Signed   By: San Morelle M.D.   On: 08/21/2015 12:17  Dg C-arm 1-60 Min  Result Date: 08/22/2015 CLINICAL DATA:  ORIF left hip fracture. EXAM: DG C-ARM 61-120 MIN; LEFT FEMUR 2 VIEWS COMPARISON:  Preoperative radiographs. FINDINGS: Four fluoroscopic spot images from the operating room post fixation of intertrochanteric left hip fracture demonstrate intra medullary femoral nail with distal locking and proximal lag screws. The fracture is in improved alignment. Total fluoroscopy time 1 minutes  15 seconds. IMPRESSION: Intraoperative fluoroscopy during intertrochanteric left hip fracture ORIF. Electronically Signed   By: Jeb Levering M.D.   On: 08/22/2015 23:51   Dg Hip Unilat W Or Wo Pelvis 2-3 Views Left  Result Date: 08/21/2015 CLINICAL DATA:  Fall at home.  Left hip pain. EXAM: DG HIP (WITH OR WITHOUT PELVIS) 2-3V LEFT COMPARISON:  None. FINDINGS: There is a left femoral intertrochanteric fracture with mild displacement. Slight varus angulation. No subluxation or dislocation. Mild degenerative changes in the hips bilaterally. IMPRESSION: Left femoral intertrochanteric fracture with mild displacement and varus angulation. Electronically Signed   By: Rolm Baptise M.D.   On: 08/21/2015 11:29  Dg Femur Min 2 Views Left  Result Date: 08/22/2015 CLINICAL DATA:  ORIF left hip fracture. EXAM: DG C-ARM 61-120 MIN; LEFT FEMUR 2 VIEWS COMPARISON:  Preoperative radiographs. FINDINGS: Four fluoroscopic spot images from the operating room post fixation of intertrochanteric left hip fracture demonstrate intra medullary femoral nail with distal locking and proximal lag screws. The fracture is in improved alignment. Total fluoroscopy time 1 minutes 15 seconds. IMPRESSION: Intraoperative fluoroscopy during intertrochanteric left hip fracture ORIF. Electronically Signed   By: Jeb Levering M.D.   On: 08/22/2015 23:51   Dg Femur Min 2 Views Left  Result Date: 08/21/2015 CLINICAL DATA:  Unwitnessed fall. Patient complains of left hip pain. EXAM: LEFT FEMUR 2 VIEWS COMPARISON:  Hip radiograph 08/21/2015 FINDINGS: Re- demonstrated is left femoral intertrochanteric oblique fracture with mild displacement and varus angulation of the distal fracture fragment. There is generalized osteopenia. No distal femoral fractures are seen. IMPRESSION: Known left femoral intertrochanteric fracture with mild displacement and varus angulation. Osteopenia. Electronically Signed   By: Fidela Salisbury M.D.   On: 08/21/2015  13:11  Dg Femur Port Min 2 Views Left  Result Date: 08/22/2015 CLINICAL DATA:  Status post IM nail left femur. EXAM: LEFT FEMUR PORTABLE 2 VIEWS COMPARISON:  Preoperative radiographs yesterday. FINDINGS: Intra medullary nail with distal locking and proximal lag screw traverses intertrochanteric left hip fracture. Fracture is in improved alignment. Recent postsurgical change includes air in the subcutaneous soft tissues.  No immediate complication is seen. IMPRESSION: ORIF of intertrochanteric left hip fracture in improved alignment. Electronically Signed   By: Jeb Levering M.D.   On: 08/22/2015 23:49   Ct Maxillofacial Wo Contrast  Result Date: 08/21/2015 CLINICAL DATA:  Tripped and fell and chair today. Left supraorbital hematoma. Personal history of head trauma. No loss of consciousness. The patient was on the floor for 30 minutes prior to receiving help. EXAM: CT HEAD WITHOUT CONTRAST CT MAXILLOFACIAL WITHOUT CONTRAST CT CERVICAL SPINE WITHOUT CONTRAST TECHNIQUE: Multidetector CT imaging of the head, cervical spine, and maxillofacial structures were performed using the standard protocol without intravenous contrast. Multiplanar CT image reconstructions of the cervical spine and maxillofacial structures were also generated. COMPARISON:  CT head and cervical spine 01/02/2013. FINDINGS: CT HEAD FINDINGS Moderate generalized atrophy and diffuse white matter disease is present. No acute infarct, hemorrhage, or mass lesion is present. The ventricles are proportionate to the degree of atrophy. No significant extra-axial fluid collection is present. Extensive atherosclerotic calcifications are present throughout the cavernous internal carotid arteries vertebrobasilar system. The paranasal sinuses and mastoid air cells are clear. A left supraorbital scalp hematoma measures 2.7 x 1.6 x 1.7 cm without an underlying fracture. CT MAXILLOFACIAL FINDINGS The left supraorbital scalp hematoma is again seen. There is no  underlying fracture. The paranasal sinuses and mastoid air cells are clear. Bilateral lens replacements are present. The globes and orbits are otherwise intact. There is no significant postseptal inflammation on the left. Minimal infraorbital edema is present on the left. The mandible is intact and located. Torus mandibularis are noted bilaterally. Soft tissues of the face and muscles of mastication are otherwise within normal limits. Salivary glands are unremarkable. CT CERVICAL SPINE FINDINGS The cervical spine is imaged from the skull base through T2-3. Chronic loss of disc height is present at each level from C4-5 through C7-T1. There slight degenerative anterolisthesis at C4-5. Chronic endplate changes and uncovertebral spurring is present at these levels in particular. No acute fracture traumatic subluxation is present. Osseous foraminal narrowing is most pronounced at C5-6 and C6-7 bilaterally. There is heterogeneous enlargement of the thyroid bilaterally. No dominant nodule is present. The lung apices are clear. IMPRESSION: 1. Moderate atrophy and white matter disease without acute intracranial abnormality. 2. 2.7 x 1.6 x 1.7 cm left supraorbital scalp hematoma without underlying fracture or globe injury. 3. Minimal infraorbital soft tissue swelling without other significant facial trauma. 4. Moderate multilevel spondylosis of the cervical spine without evidence for acute fracture or traumatic subluxation. Electronically Signed   By: San Morelle M.D.   On: 08/21/2015 11:58   Assessment/Plan There are no diagnoses linked to this encounter.HTN (hypertension) controlled, continue Lisinopril 40mg , Metoprolol succinate 100mg  update CMP  NSTEMI (non-ST elevated myocardial infarction) (HCC) No angina since SNF admission, continue ASA bid  Constipation Stable, contineu Fiber Laxative daily, Senna S I bid.   Type 2 diabetes, controlled, with neuropathy ([place]) Last Hgb A1c 6.2 10/28/14, Hgb a1c  6.2 04/2015, diet controlled, update Hgb a1c  Diabetic neuropathy (HCC) Last Hgb A1c 6.2 10/28/14, Hgb a1c 6.2 04/2015, diet controlled  Dementia Gradual decline, continue Namenda and Aricept, SNF for care needs.     Closed left hip fracture (HCC) Left hip, incision intact, f/u Ortho, continue ASA 325mg  bid x 6 weeks. Adding Tramadol q6h prn for pain in addition to Tylenol.   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 Off Myrbetriq.  Facial contusion Should heal.  Acute blood loss anemia S/p 1 unit RRBC  transfusion, update CBC, may consider dc ASA if Hgb drops.      Family/ staff Communication: continue SNF for care assistance. Rehab, goal is to ambulate with walker for short distance.   Labs/tests ordered: CBC, CMP, Hgb a1c

## 2015-08-29 LAB — HEPATIC FUNCTION PANEL
ALK PHOS: 50 U/L (ref 25–125)
ALT: 13 U/L (ref 7–35)
AST: 17 U/L (ref 13–35)
Bilirubin, Total: 0.8 mg/dL

## 2015-08-29 LAB — BASIC METABOLIC PANEL
BUN: 22 mg/dL — AB (ref 4–21)
CREATININE: 0.6 mg/dL (ref <=1.1)
Glucose: 129 mg/dL
POTASSIUM: 3.8 mmol/L (ref 3.4–5.3)
Sodium: 141 mmol/L (ref 137–147)

## 2015-08-29 LAB — CBC AND DIFFERENTIAL
HCT: 29 % — AB (ref 36–46)
Hemoglobin: 9.6 g/dL — AB (ref 12.0–16.0)
PLATELETS: 223 10*3/uL (ref 150–399)
WBC: 7.9 10^3/mL

## 2015-08-29 LAB — HEMOGLOBIN A1C: HEMOGLOBIN A1C: 5.7

## 2015-08-30 ENCOUNTER — Encounter: Payer: Self-pay | Admitting: Nurse Practitioner

## 2015-08-30 ENCOUNTER — Non-Acute Institutional Stay (SKILLED_NURSING_FACILITY): Payer: Medicare Other | Admitting: Nurse Practitioner

## 2015-08-30 DIAGNOSIS — R319 Hematuria, unspecified: Secondary | ICD-10-CM | POA: Diagnosis not present

## 2015-08-30 DIAGNOSIS — E876 Hypokalemia: Secondary | ICD-10-CM

## 2015-08-30 DIAGNOSIS — D62 Acute posthemorrhagic anemia: Secondary | ICD-10-CM | POA: Diagnosis not present

## 2015-08-30 DIAGNOSIS — I10 Essential (primary) hypertension: Secondary | ICD-10-CM

## 2015-08-30 DIAGNOSIS — K5900 Constipation, unspecified: Secondary | ICD-10-CM

## 2015-08-30 DIAGNOSIS — E0841 Diabetes mellitus due to underlying condition with diabetic mononeuropathy: Secondary | ICD-10-CM | POA: Diagnosis not present

## 2015-08-30 DIAGNOSIS — F0390 Unspecified dementia without behavioral disturbance: Secondary | ICD-10-CM | POA: Diagnosis not present

## 2015-08-30 DIAGNOSIS — E1140 Type 2 diabetes mellitus with diabetic neuropathy, unspecified: Secondary | ICD-10-CM | POA: Diagnosis not present

## 2015-08-30 NOTE — Assessment & Plan Note (Signed)
Hgb a1c 5.7 08/30/15

## 2015-08-30 NOTE — Assessment & Plan Note (Addendum)
controlled, continue Lisinopril 40mg , Metoprolol succinate 100mg , 08/30/15 Na 141, K 3.8, Bun 22, creat 0.56

## 2015-08-30 NOTE — Assessment & Plan Note (Signed)
S/p 1 unit RRBC transfusion, update CBC, may consider dc ASA if Hgb drops upon dc summary 08/30/15 Hgb 9.6, hematuria noted, pending UA C/S, will hold ASA, repeat CBC

## 2015-08-30 NOTE — Progress Notes (Signed)
Location:   [facility] Room Number: N 27 Place of Service:  SNF (31) Provider:  Cashe Gatt  NP    Patient Care Team: Estill Dooms, MD as PCP - General (Internal Medicine) Nashae Maudlin Otho Darner, NP as Nurse Practitioner (Internal Medicine)  Extended Emergency Contact Information Primary Emergency Contact: Hanzaker,[name] Address: [address]          [place],  [address] Johnnette Litter of [place] Phone: [phone] Mobile Phone: [phone] Relation: Daughter Secondary Emergency [place]: [place] [address] [place] of [place] Phone: [phone] Relation: Grandson  Code Status:  DNR Goals of care: Advanced Directive information Advanced Directives 08/30/2015  Does patient have an advance directive? -  Type of Paramedic of [place];Living will  Does patient want to make changes to advanced directive? -  Copy of advanced directive(s) in chart? -  Pre-existing out of facility DNR order (yellow form or pink MOST form) -     Chief Complaint  Patient presents with  . Acute Visit    Vginal bleeding (moderate amount)    HPI:  Pt is a 82 y.o. female seen today for an acute visit for Reported ? Vaginal bleeding-found blood on her diaper, catheterized and bloody urine noted. UA C/S and CBC pending.   S/p left hip ORIF, surgical wounds intact, no s/s of peri wound cellulitis, taking ASA bid, post anemia, s/p PRBC transfusion, last Hgb 9.6 08/30/15, will repeat CBC due to hematuria.   Hx of dementia, taking Aricept and Namenda, resides in SNF for care needs. Blood pressure is controlled on Metoprolol and Lisinopril   Past Medical History:  Diagnosis Date  . Atrial fibrillation ([place])   . Bradycardia   . Dementia   . Depression   . Diabetes mellitus without complication ([place])   . Fall at nursing home   . Glaucoma   . HOH (hard of hearing)   . Hypertension   . Lower back pain   . Melanoma ([place])  1960   left shoulder skin  . Muscle pain   . Peripheral neuropathy ([place])   . TBI (traumatic brain injury) ([place])   . Umbilical hernia   . Wears glasses    Past Surgical History:  Procedure Laterality Date  . FRACTURE SURGERY Left 08/22/2015   hip  . INTRAMEDULLARY (IM) NAIL INTERTROCHANTERIC Left 08/22/2015   Procedure: INTRAMEDULLARY (IM) NAIL INTERTROCHANTRIC;  Surgeon: Rod Can, MD;  Location: [facility];  Service: Orthopedics;  Laterality: Left;  . SKIN CANCER EXCISION Left 1960's   melanoma shoulder    Allergies  Allergen Reactions  . Codeine Other (See Comments)    Per [facility]      Medication List       Accurate as of 08/30/15  5:17 PM. Always use your most recent med list.          acetaminophen 325 MG tablet Commonly known as:  TYLENOL Take 650 mg by mouth every 4 (four) hours as needed for moderate pain.   aspirin 81 MG EC tablet Take 1 tablet (81 mg total) by mouth 2 (two) times daily with a meal.   CERTAVITE/ANTIOXIDANTS PO Take 1 tablet by mouth daily.   donepezil 10 MG tablet Commonly known as:  ARICEPT Take 10 mg by mouth at bedtime.   feeding supplement Liqd Take 1 Container by mouth 2 (two) times daily between meals.   FIBER-CAPS PO Take 1 capsule  by mouth daily.   ipratropium-albuterol 0.5-2.5 (3) MG/3ML Soln Commonly known as:  DUONEB Take 3 mLs by nebulization 2 (two) times daily as needed (for Shortness of breath).   lisinopril 40 MG tablet Commonly known as:  PRINIVIL,ZESTRIL Take 40 mg by mouth daily. For HTN   memantine 10 MG tablet Commonly known as:  NAMENDA Take 10 mg by mouth 2 (two) times daily.   metoprolol succinate 100 MG 24 hr tablet Commonly known as:  TOPROL-XL Take 100 mg by mouth daily. Take with or immediately following a meal.   sennosides-docusate sodium 8.6-50 MG tablet Commonly known as:  SENOKOT-S Take 1 tablet by mouth daily. Hold for loose stool       Review of Systems  Constitutional: Positive for  fatigue.  HENT: Negative.   Eyes: Positive for visual disturbance (corrective lenses).  Respiratory: Negative.   Cardiovascular: Negative for chest pain, palpitations and leg swelling.  Gastrointestinal: Negative.   Endocrine: Positive for polyuria.       Diabetic with neuropathy  Genitourinary: Positive for frequency. Negative for dysuria and flank pain.       Episodes of incontinence. Hematuria.   Musculoskeletal: Positive for gait problem.  Skin: Negative.        Facial ecchymoses, right eye brown hematoma. Left hip surgical incisions intact, no s/s of peri wound cellulitis.   Allergic/Immunologic: Negative.   Neurological: Negative for tremors, seizures, syncope, facial asymmetry, speech difficulty, weakness, light-headedness, numbness and headaches.       Dementia  Psychiatric/Behavioral: Positive for confusion and sleep disturbance.    Immunization History  Administered Date(s) Administered  . Influenza-Unspecified 11/10/2012, 10/27/2013, 10/28/2014  . PPD Test 11/17/2011, 11/16/2014, 08/24/2015  . Pneumococcal Polysaccharide-23 11/23/1999  . Td 10/22/2000   Pertinent  Health Maintenance Due  Topic Date Due  . OPHTHALMOLOGY EXAM  09/24/1930  . DEXA SCAN  09/23/1985  . PNA vac Low Risk Adult (2 of 2 - PCV13) 11/22/2000  . HEMOGLOBIN A1C  04/28/2015  . INFLUENZA VACCINE  08/23/2015  . FOOT EXAM  10/26/2015   Fall Risk  11/29/2014 11/09/2014 10/26/2014 10/27/2013 10/27/2013  Falls in the past year? No No No No No  Risk for fall due to : - - - Impaired mobility;Impaired balance/gait;Mental status change -   Functional Status Survey:    Vitals:   08/30/15 0945  BP: (!) 158/74  Pulse: 84  Resp: 16  Temp: 98.8 F (37.1 C)  Weight: 119 lb (54 kg)  Height: 5\' 1"  (1.549 m)   Body mass index is 22.48 kg/m. Physical Exam  Constitutional: She appears well-developed and well-nourished.  HENT:  Head: Normocephalic and atraumatic.  Right Ear: External ear normal.  Left  Ear: External ear normal.  Eyes: Conjunctivae and EOM are normal. Pupils are equal, round, and reactive to light.  Neck: Neck supple. No JVD present. No tracheal deviation present. No thyromegaly present.  Cardiovascular: Normal rate.   Murmur heard.  Systolic murmur is present with a grade of 2/6  Pulmonary/Chest: Effort normal and breath sounds normal. No respiratory distress. She has no rales.  Abdominal: Soft. Bowel sounds are normal. She exhibits no mass. There is no tenderness.  Genitourinary:  Genitourinary Comments: Reported ? Vaginal bleeding-found blood on her diaper, catheterized and bloody urine noted.   Musculoskeletal: She exhibits no edema or tenderness.  Unstable gait. Using rolling walker. Mild scoliosis of thoracic and lumbar spine.  Lymphadenopathy:    She has no cervical adenopathy.  Neurological: She is alert.  She has normal reflexes. No cranial nerve deficit. She exhibits normal muscle tone. Coordination normal.  Demented  Skin: Skin is warm and dry. No rash noted. No erythema.  Facial ecchymoses, right eye brown hematoma. Left hip s/p ORIF, incision intact.    Psychiatric: She has a normal mood and affect. Her behavior is normal. Thought content normal. Her speech is not delayed and not slurred. She is not agitated, not aggressive, not hyperactive, not slowed, not withdrawn, not actively hallucinating and not combative. Thought content is not paranoid and not delusional. Cognition and memory are impaired. She exhibits abnormal recent memory.    Labs reviewed:  Recent Labs  08/22/15 0429 08/22/15 1020 08/23/15 0514 08/23/15 0730 08/24/15 0543 08/29/15  NA 137  --  137  --  137 141  K 4.3  --  3.0*  --  3.7 3.8  CL 107  --  106  --  107  --   CO2 23  --  21*  --  23  --   GLUCOSE 105*  --  157*  --  145*  --   BUN 13  --  11  --  19 22*  CREATININE 0.66  --  0.86  --  0.76 0.6  CALCIUM 8.5*  --  8.2*  --  8.4*  --   MG  --  1.6*  --  1.8  --   --      Recent Labs  08/21/15 1209 08/22/15 0429 08/29/15  AST 20 15 17   ALT 17 13* 13  ALKPHOS 66 51 50  BILITOT 0.7 1.2  --   PROT 7.2 5.9*  --   ALBUMIN 4.3 3.6  --     Recent Labs  08/21/15 1209 08/22/15 0429 08/23/15 0514 08/24/15 0543 08/24/15 1433 08/29/15  WBC 12.0* 8.0 13.5* 8.9 10.8* 7.9  NEUTROABS 10.6* 5.4  --   --   --   --   HGB 13.9 11.5* 9.0* 7.9* 9.5* 9.6*  HCT 43.0 34.6* 28.0* 24.3* 29.4* 29*  MCV 84.5 83.6 84.6 83.2 85.0  --   PLT 177 160 131* 122* 119* 223   Lab Results  Component Value Date   TSH 0.88 05/05/2015   Lab Results  Component Value Date   HGBA1C 5.7 08/29/2015   Lab Results  Component Value Date   CHOL 191 05/20/2012   HDL 38 (L) 05/20/2012   LDLCALC 118 (H) 05/20/2012   TRIG 174 (H) 05/20/2012   CHOLHDL 5.0 05/20/2012    Significant Diagnostic Results in last 30 days:  Ct Head Wo Contrast  Result Date: 08/21/2015 CLINICAL DATA:  Tripped and fell and chair today. Left supraorbital hematoma. Personal history of head trauma. No loss of consciousness. The patient was on the floor for 30 minutes prior to receiving help. EXAM: CT HEAD WITHOUT CONTRAST CT MAXILLOFACIAL WITHOUT CONTRAST CT CERVICAL SPINE WITHOUT CONTRAST TECHNIQUE: Multidetector CT imaging of the head, cervical spine, and maxillofacial structures were performed using the standard protocol without intravenous contrast. Multiplanar CT image reconstructions of the cervical spine and maxillofacial structures were also generated. COMPARISON:  CT head and cervical spine 01/02/2013. FINDINGS: CT HEAD FINDINGS Moderate generalized atrophy and diffuse white matter disease is present. No acute infarct, hemorrhage, or mass lesion is present. The ventricles are proportionate to the degree of atrophy. No significant extra-axial fluid collection is present. Extensive atherosclerotic calcifications are present throughout the cavernous internal carotid arteries vertebrobasilar system. The  paranasal sinuses and mastoid air cells are  clear. A left supraorbital scalp hematoma measures 2.7 x 1.6 x 1.7 cm without an underlying fracture. CT MAXILLOFACIAL FINDINGS The left supraorbital scalp hematoma is again seen. There is no underlying fracture. The paranasal sinuses and mastoid air cells are clear. Bilateral lens replacements are present. The globes and orbits are otherwise intact. There is no significant postseptal inflammation on the left. Minimal infraorbital edema is present on the left. The mandible is intact and located. Torus mandibularis are noted bilaterally. Soft tissues of the face and muscles of mastication are otherwise within normal limits. Salivary glands are unremarkable. CT CERVICAL SPINE FINDINGS The cervical spine is imaged from the skull base through T2-3. Chronic loss of disc height is present at each level from C4-5 through C7-T1. There slight degenerative anterolisthesis at C4-5. Chronic endplate changes and uncovertebral spurring is present at these levels in particular. No acute fracture traumatic subluxation is present. Osseous foraminal narrowing is most pronounced at C5-6 and C6-7 bilaterally. There is heterogeneous enlargement of the thyroid bilaterally. No dominant nodule is present. The lung apices are clear. IMPRESSION: 1. Moderate atrophy and white matter disease without acute intracranial abnormality. 2. 2.7 x 1.6 x 1.7 cm left supraorbital scalp hematoma without underlying fracture or globe injury. 3. Minimal infraorbital soft tissue swelling without other significant facial trauma. 4. Moderate multilevel spondylosis of the cervical spine without evidence for acute fracture or traumatic subluxation. Electronically Signed   By: San Morelle M.D.   On: 08/21/2015 11:58  Ct Cervical Spine Wo Contrast  Result Date: 08/21/2015 CLINICAL DATA:  Tripped and fell and chair today. Left supraorbital hematoma. Personal history of head trauma. No loss of consciousness.  The patient was on the floor for 30 minutes prior to receiving help. EXAM: CT HEAD WITHOUT CONTRAST CT MAXILLOFACIAL WITHOUT CONTRAST CT CERVICAL SPINE WITHOUT CONTRAST TECHNIQUE: Multidetector CT imaging of the head, cervical spine, and maxillofacial structures were performed using the standard protocol without intravenous contrast. Multiplanar CT image reconstructions of the cervical spine and maxillofacial structures were also generated. COMPARISON:  CT head and cervical spine 01/02/2013. FINDINGS: CT HEAD FINDINGS Moderate generalized atrophy and diffuse white matter disease is present. No acute infarct, hemorrhage, or mass lesion is present. The ventricles are proportionate to the degree of atrophy. No significant extra-axial fluid collection is present. Extensive atherosclerotic calcifications are present throughout the cavernous internal carotid arteries vertebrobasilar system. The paranasal sinuses and mastoid air cells are clear. A left supraorbital scalp hematoma measures 2.7 x 1.6 x 1.7 cm without an underlying fracture. CT MAXILLOFACIAL FINDINGS The left supraorbital scalp hematoma is again seen. There is no underlying fracture. The paranasal sinuses and mastoid air cells are clear. Bilateral lens replacements are present. The globes and orbits are otherwise intact. There is no significant postseptal inflammation on the left. Minimal infraorbital edema is present on the left. The mandible is intact and located. Torus mandibularis are noted bilaterally. Soft tissues of the face and muscles of mastication are otherwise within normal limits. Salivary glands are unremarkable. CT CERVICAL SPINE FINDINGS The cervical spine is imaged from the skull base through T2-3. Chronic loss of disc height is present at each level from C4-5 through C7-T1. There slight degenerative anterolisthesis at C4-5. Chronic endplate changes and uncovertebral spurring is present at these levels in particular. No acute fracture  traumatic subluxation is present. Osseous foraminal narrowing is most pronounced at C5-6 and C6-7 bilaterally. There is heterogeneous enlargement of the thyroid bilaterally. No dominant nodule is present. The lung apices are  clear. IMPRESSION: 1. Moderate atrophy and white matter disease without acute intracranial abnormality. 2. 2.7 x 1.6 x 1.7 cm left supraorbital scalp hematoma without underlying fracture or globe injury. 3. Minimal infraorbital soft tissue swelling without other significant facial trauma. 4. Moderate multilevel spondylosis of the cervical spine without evidence for acute fracture or traumatic subluxation. Electronically Signed   By: San Morelle M.D.   On: 08/21/2015 11:58  Pelvis Portable  Result Date: 08/22/2015 CLINICAL DATA:  Post ORIF left hip fracture. EXAM: PORTABLE PELVIS 1-2 VIEWS COMPARISON:  Preoperative radiographs yesterday FINDINGS: Interval intra medullary nail and like screw fixation of comminuted intertrochanteric left hip fracture. Alignment is improved from preoperative exam. No apparent immediate postoperative complication. No additional acute fracture of the bony pelvis. IMPRESSION: Post ORIF intertrochanteric left hip fracture. No immediate postoperative complication. Electronically Signed   By: Jeb Levering M.D.   On: 08/22/2015 23:53   Dg Chest Portable 1 View  Result Date: 08/21/2015 CLINICAL DATA:  Unwitnessed fall from chair today.  Hip fracture. EXAM: PORTABLE CHEST 1 VIEW COMPARISON:  Two-view chest x-ray 01/02/2013 FINDINGS: The heart size is normal. Lung volumes remain low. Atherosclerotic changes are noted at the aortic arch. Chronic interstitial changes are stable. No acute fractures are present.  There is no pneumothorax. IMPRESSION: 1. No acute cardiopulmonary disease or significant interval change. 2. Stable chronic interstitial coarsening. 3. Atherosclerosis of the thoracic aorta. Electronically Signed   By: San Morelle M.D.   On:  08/21/2015 12:17  Dg C-arm 1-60 Min  Result Date: 08/22/2015 CLINICAL DATA:  ORIF left hip fracture. EXAM: DG C-ARM 61-120 MIN; LEFT FEMUR 2 VIEWS COMPARISON:  Preoperative radiographs. FINDINGS: Four fluoroscopic spot images from the operating room post fixation of intertrochanteric left hip fracture demonstrate intra medullary femoral nail with distal locking and proximal lag screws. The fracture is in improved alignment. Total fluoroscopy time 1 minutes 15 seconds. IMPRESSION: Intraoperative fluoroscopy during intertrochanteric left hip fracture ORIF. Electronically Signed   By: Jeb Levering M.D.   On: 08/22/2015 23:51   Dg Hip Unilat W Or Wo Pelvis 2-3 Views Left  Result Date: 08/21/2015 CLINICAL DATA:  Fall at home.  Left hip pain. EXAM: DG HIP (WITH OR WITHOUT PELVIS) 2-3V LEFT COMPARISON:  None. FINDINGS: There is a left femoral intertrochanteric fracture with mild displacement. Slight varus angulation. No subluxation or dislocation. Mild degenerative changes in the hips bilaterally. IMPRESSION: Left femoral intertrochanteric fracture with mild displacement and varus angulation. Electronically Signed   By: Rolm Baptise M.D.   On: 08/21/2015 11:29  Dg Femur Min 2 Views Left  Result Date: 08/22/2015 CLINICAL DATA:  ORIF left hip fracture. EXAM: DG C-ARM 61-120 MIN; LEFT FEMUR 2 VIEWS COMPARISON:  Preoperative radiographs. FINDINGS: Four fluoroscopic spot images from the operating room post fixation of intertrochanteric left hip fracture demonstrate intra medullary femoral nail with distal locking and proximal lag screws. The fracture is in improved alignment. Total fluoroscopy time 1 minutes 15 seconds. IMPRESSION: Intraoperative fluoroscopy during intertrochanteric left hip fracture ORIF. Electronically Signed   By: Jeb Levering M.D.   On: 08/22/2015 23:51   Dg Femur Min 2 Views Left  Result Date: 08/21/2015 CLINICAL DATA:  Unwitnessed fall. Patient complains of left hip pain. EXAM:  LEFT FEMUR 2 VIEWS COMPARISON:  Hip radiograph 08/21/2015 FINDINGS: Re- demonstrated is left femoral intertrochanteric oblique fracture with mild displacement and varus angulation of the distal fracture fragment. There is generalized osteopenia. No distal femoral fractures are seen. IMPRESSION: Known left  femoral intertrochanteric fracture with mild displacement and varus angulation. Osteopenia. Electronically Signed   By: Fidela Salisbury M.D.   On: 08/21/2015 13:11  Dg Femur Port Min 2 Views Left  Result Date: 08/22/2015 CLINICAL DATA:  Status post IM nail left femur. EXAM: LEFT FEMUR PORTABLE 2 VIEWS COMPARISON:  Preoperative radiographs yesterday. FINDINGS: Intra medullary nail with distal locking and proximal lag screw traverses intertrochanteric left hip fracture. Fracture is in improved alignment. Recent postsurgical change includes air in the subcutaneous soft tissues. No immediate complication is seen. IMPRESSION: ORIF of intertrochanteric left hip fracture in improved alignment. Electronically Signed   By: Jeb Levering M.D.   On: 08/22/2015 23:49   Ct Maxillofacial Wo Contrast  Result Date: 08/21/2015 CLINICAL DATA:  Tripped and fell and chair today. Left supraorbital hematoma. Personal history of head trauma. No loss of consciousness. The patient was on the floor for 30 minutes prior to receiving help. EXAM: CT HEAD WITHOUT CONTRAST CT MAXILLOFACIAL WITHOUT CONTRAST CT CERVICAL SPINE WITHOUT CONTRAST TECHNIQUE: Multidetector CT imaging of the head, cervical spine, and maxillofacial structures were performed using the standard protocol without intravenous contrast. Multiplanar CT image reconstructions of the cervical spine and maxillofacial structures were also generated. COMPARISON:  CT head and cervical spine 01/02/2013. FINDINGS: CT HEAD FINDINGS Moderate generalized atrophy and diffuse white matter disease is present. No acute infarct, hemorrhage, or mass lesion is present. The  ventricles are proportionate to the degree of atrophy. No significant extra-axial fluid collection is present. Extensive atherosclerotic calcifications are present throughout the cavernous internal carotid arteries vertebrobasilar system. The paranasal sinuses and mastoid air cells are clear. A left supraorbital scalp hematoma measures 2.7 x 1.6 x 1.7 cm without an underlying fracture. CT MAXILLOFACIAL FINDINGS The left supraorbital scalp hematoma is again seen. There is no underlying fracture. The paranasal sinuses and mastoid air cells are clear. Bilateral lens replacements are present. The globes and orbits are otherwise intact. There is no significant postseptal inflammation on the left. Minimal infraorbital edema is present on the left. The mandible is intact and located. Torus mandibularis are noted bilaterally. Soft tissues of the face and muscles of mastication are otherwise within normal limits. Salivary glands are unremarkable. CT CERVICAL SPINE FINDINGS The cervical spine is imaged from the skull base through T2-3. Chronic loss of disc height is present at each level from C4-5 through C7-T1. There slight degenerative anterolisthesis at C4-5. Chronic endplate changes and uncovertebral spurring is present at these levels in particular. No acute fracture traumatic subluxation is present. Osseous foraminal narrowing is most pronounced at C5-6 and C6-7 bilaterally. There is heterogeneous enlargement of the thyroid bilaterally. No dominant nodule is present. The lung apices are clear. IMPRESSION: 1. Moderate atrophy and white matter disease without acute intracranial abnormality. 2. 2.7 x 1.6 x 1.7 cm left supraorbital scalp hematoma without underlying fracture or globe injury. 3. Minimal infraorbital soft tissue swelling without other significant facial trauma. 4. Moderate multilevel spondylosis of the cervical spine without evidence for acute fracture or traumatic subluxation. Electronically Signed   By:  San Morelle M.D.   On: 08/21/2015 11:58   Assessment/Plan There are no diagnoses linked to this encounter.HTN (hypertension) controlled, continue Lisinopril 40mg , Metoprolol succinate 100mg , 08/30/15 Na 141, K 3.8, Bun 22, creat 0.56   Constipation Stable, contineu Fiber Laxative daily, Senna S I bid.    Dementia Gradual decline, continue Namenda and Aricept, SNF for care needs.   Acute blood loss anemia S/p 1 unit RRBC transfusion, update CBC,  may consider dc ASA if Hgb drops upon dc summary 08/30/15 Hgb 9.6, hematuria noted, pending UA C/S, will hold ASA, repeat CBC  Hypokalemia 08/30/15 Na 141, K 3.8, Bun 22, creat 0.56   Type 2 diabetes, controlled, with neuropathy ([facility]) Hgb a1c 5.7 08/30/15  Diabetic neuropathy ([place]) 08/30/15 Hgb a1c 5.7, continue monitor CBGs and diet control.   Hematuria 08/30/15 reported ? Vaginal bleeding, bloody urine noted when catheterized, pending CBC, UA C/S, continue to monitor for possible vaginal bleed, hold ASA, may consider GYN if vaginal bleed is validated.       Family/ staff Communication: continue SNF for care assistance.   Labs/tests ordered:  CBC, UA C/S

## 2015-08-30 NOTE — Assessment & Plan Note (Signed)
Gradual decline, continue Namenda and Aricept, SNF for care needs.

## 2015-08-30 NOTE — Assessment & Plan Note (Signed)
08/30/15 reported ? Vaginal bleeding, bloody urine noted when catheterized, pending CBC, UA C/S, continue to monitor for possible vaginal bleed, hold ASA, may consider GYN if vaginal bleed is validated.

## 2015-08-30 NOTE — Assessment & Plan Note (Signed)
08/30/15 Na 141, K 3.8, Bun 22, creat 0.56

## 2015-08-30 NOTE — Progress Notes (Deleted)
Location:   [facility] Room Number: N 27 Place of Service:  SNF (31) Provider:  Mast, Man  NP     Patient Care Team: Estill Dooms, MD as PCP - General (Internal Medicine) Man Otho Darner, NP as Nurse Practitioner (Internal Medicine)  Extended Emergency Contact Information Primary Emergency Contact: Hanzaker,[name] Address: [address]          [place], [place] [address] Johnnette Litter of [place] Phone: [phone] Mobile Phone: [phone] Relation: Daughter Secondary Emergency [place]: [place] [address] [place] Phone: [phone] Relation: Grandson  Code Status:  DNR Goals of care: Advanced Directive information Advanced Directives 08/30/2015  Does patient have an advance directive? -  Type of Paramedic of [place];Living will  Does patient want to make changes to advanced directive? -  Copy of advanced directive(s) in chart? -  Pre-existing out of facility DNR order (yellow form or pink MOST form) -     Chief Complaint  Patient presents with  . Acute Visit    Vginal bleeding (moderate amount)    HPI:  Pt is a 82 y.o. female seen today for an acute visit for    Past Medical History:  Diagnosis Date  . Atrial fibrillation ([place])   . Bradycardia   . Dementia   . Depression   . Diabetes mellitus without complication ([place])   . Fall at nursing home   . Glaucoma   . HOH (hard of hearing)   . Hypertension   . Lower back pain   . Melanoma ([place]) 1960   left shoulder skin  . Muscle pain   . Peripheral neuropathy ([place])   . TBI (traumatic brain injury) ([place])   . Umbilical hernia   . Wears glasses    Past Surgical History:  Procedure Laterality Date  . FRACTURE SURGERY Left 08/22/2015   hip  . INTRAMEDULLARY (IM) NAIL INTERTROCHANTERIC Left 08/22/2015   Procedure: INTRAMEDULLARY (IM) NAIL INTERTROCHANTRIC;  Surgeon: Rod Can, MD;  Location: [facility];  Service:  Orthopedics;  Laterality: Left;  . SKIN CANCER EXCISION Left 1960's   melanoma shoulder    Allergies  Allergen Reactions  . Codeine Other (See Comments)    Per [facility]      Medication List       Accurate as of 08/30/15 11:08 AM. Always use your most recent med list.          acetaminophen 325 MG tablet Commonly known as:  TYLENOL Take 650 mg by mouth every 4 (four) hours as needed for moderate pain.   aspirin 81 MG EC tablet Take 1 tablet (81 mg total) by mouth 2 (two) times daily with a meal.   CERTAVITE/ANTIOXIDANTS PO Take 1 tablet by mouth daily.   donepezil 10 MG tablet Commonly known as:  ARICEPT Take 10 mg by mouth at bedtime.   feeding supplement Liqd Take 1 Container by mouth 2 (two) times daily between meals.   FIBER-CAPS PO Take 1 capsule by mouth daily.   ipratropium-albuterol 0.5-2.5 (3) MG/3ML Soln Commonly known as:  DUONEB Take 3 mLs by nebulization 2 (two) times daily as needed (for Shortness of breath).   lisinopril 40 MG tablet Commonly known as:  PRINIVIL,ZESTRIL Take 40 mg by mouth daily. For HTN   memantine 10 MG tablet Commonly known as:  NAMENDA Take 10 mg by mouth 2 (two) times daily.   metoprolol succinate  100 MG 24 hr tablet Commonly known as:  TOPROL-XL Take 100 mg by mouth daily. Take with or immediately following a meal.   sennosides-docusate sodium 8.6-50 MG tablet Commonly known as:  SENOKOT-S Take 1 tablet by mouth daily. Hold for loose stool       Review of Systems  Immunization History  Administered Date(s) Administered  . Influenza-Unspecified 11/10/2012, 10/27/2013, 10/28/2014  . PPD Test 11/17/2011, 11/16/2014, 08/24/2015  . Pneumococcal Polysaccharide-23 11/23/1999  . Td 10/22/2000   Pertinent  Health Maintenance Due  Topic Date Due  . OPHTHALMOLOGY EXAM  09/24/1930  . DEXA SCAN  09/23/1985  . PNA vac Low Risk Adult (2 of 2 - PCV13) 11/22/2000  . HEMOGLOBIN A1C  04/28/2015  . INFLUENZA VACCINE  08/23/2015    . FOOT EXAM  10/26/2015   Fall Risk  11/29/2014 11/09/2014 10/26/2014 10/27/2013 10/27/2013  Falls in the past year? No No No No No  Risk for fall due to : - - - Impaired mobility;Impaired balance/gait;Mental status change -   Functional Status Survey:    Vitals:   08/30/15 0945  BP: (!) 158/74  Pulse: 84  Resp: 16  Temp: 98.8 F (37.1 C)  Weight: 119 lb (54 kg)  Height: 5\' 1"  (1.549 m)   Body mass index is 22.48 kg/m. Physical Exam  Labs reviewed:  Recent Labs  08/22/15 0429 08/22/15 1020 08/23/15 0514 08/23/15 0730 08/24/15 0543  NA 137  --  137  --  137  K 4.3  --  3.0*  --  3.7  CL 107  --  106  --  107  CO2 23  --  21*  --  23  GLUCOSE 105*  --  157*  --  145*  BUN 13  --  11  --  19  CREATININE 0.66  --  0.86  --  0.76  CALCIUM 8.5*  --  8.2*  --  8.4*  MG  --  1.6*  --  1.8  --     Recent Labs  05/05/15 08/21/15 1209 08/22/15 0429  AST 15 20 15   ALT 13 17 13*  ALKPHOS 52 66 51  BILITOT  --  0.7 1.2  PROT  --  7.2 5.9*  ALBUMIN  --  4.3 3.6    Recent Labs  08/21/15 1209 08/22/15 0429 08/23/15 0514 08/24/15 0543 08/24/15 1433  WBC 12.0* 8.0 13.5* 8.9 10.8*  NEUTROABS 10.6* 5.4  --   --   --   HGB 13.9 11.5* 9.0* 7.9* 9.5*  HCT 43.0 34.6* 28.0* 24.3* 29.4*  MCV 84.5 83.6 84.6 83.2 85.0  PLT 177 160 131* 122* 119*   Lab Results  Component Value Date   TSH 0.88 05/05/2015   Lab Results  Component Value Date   HGBA1C 6.2 (A) 10/28/2014   Lab Results  Component Value Date   CHOL 191 05/20/2012   HDL 38 (L) 05/20/2012   LDLCALC 118 (H) 05/20/2012   TRIG 174 (H) 05/20/2012   CHOLHDL 5.0 05/20/2012    Significant Diagnostic Results in last 30 days:  Ct Head Wo Contrast  Result Date: 08/21/2015 CLINICAL DATA:  Tripped and fell and chair today. Left supraorbital hematoma. Personal history of head trauma. No loss of consciousness. The patient was on the floor for 30 minutes prior to receiving help. EXAM: CT HEAD WITHOUT CONTRAST CT  MAXILLOFACIAL WITHOUT CONTRAST CT CERVICAL SPINE WITHOUT CONTRAST TECHNIQUE: Multidetector CT imaging of the head, cervical spine, and maxillofacial structures were  performed using the standard protocol without intravenous contrast. Multiplanar CT image reconstructions of the cervical spine and maxillofacial structures were also generated. COMPARISON:  CT head and cervical spine 01/02/2013. FINDINGS: CT HEAD FINDINGS Moderate generalized atrophy and diffuse white matter disease is present. No acute infarct, hemorrhage, or mass lesion is present. The ventricles are proportionate to the degree of atrophy. No significant extra-axial fluid collection is present. Extensive atherosclerotic calcifications are present throughout the cavernous internal carotid arteries vertebrobasilar system. The paranasal sinuses and mastoid air cells are clear. A left supraorbital scalp hematoma measures 2.7 x 1.6 x 1.7 cm without an underlying fracture. CT MAXILLOFACIAL FINDINGS The left supraorbital scalp hematoma is again seen. There is no underlying fracture. The paranasal sinuses and mastoid air cells are clear. Bilateral lens replacements are present. The globes and orbits are otherwise intact. There is no significant postseptal inflammation on the left. Minimal infraorbital edema is present on the left. The mandible is intact and located. Torus mandibularis are noted bilaterally. Soft tissues of the face and muscles of mastication are otherwise within normal limits. Salivary glands are unremarkable. CT CERVICAL SPINE FINDINGS The cervical spine is imaged from the skull base through T2-3. Chronic loss of disc height is present at each level from C4-5 through C7-T1. There slight degenerative anterolisthesis at C4-5. Chronic endplate changes and uncovertebral spurring is present at these levels in particular. No acute fracture traumatic subluxation is present. Osseous foraminal narrowing is most pronounced at C5-6 and C6-7 bilaterally.  There is heterogeneous enlargement of the thyroid bilaterally. No dominant nodule is present. The lung apices are clear. IMPRESSION: 1. Moderate atrophy and white matter disease without acute intracranial abnormality. 2. 2.7 x 1.6 x 1.7 cm left supraorbital scalp hematoma without underlying fracture or globe injury. 3. Minimal infraorbital soft tissue swelling without other significant facial trauma. 4. Moderate multilevel spondylosis of the cervical spine without evidence for acute fracture or traumatic subluxation. Electronically Signed   By: San Morelle M.D.   On: 08/21/2015 11:58  Ct Cervical Spine Wo Contrast  Result Date: 08/21/2015 CLINICAL DATA:  Tripped and fell and chair today. Left supraorbital hematoma. Personal history of head trauma. No loss of consciousness. The patient was on the floor for 30 minutes prior to receiving help. EXAM: CT HEAD WITHOUT CONTRAST CT MAXILLOFACIAL WITHOUT CONTRAST CT CERVICAL SPINE WITHOUT CONTRAST TECHNIQUE: Multidetector CT imaging of the head, cervical spine, and maxillofacial structures were performed using the standard protocol without intravenous contrast. Multiplanar CT image reconstructions of the cervical spine and maxillofacial structures were also generated. COMPARISON:  CT head and cervical spine 01/02/2013. FINDINGS: CT HEAD FINDINGS Moderate generalized atrophy and diffuse white matter disease is present. No acute infarct, hemorrhage, or mass lesion is present. The ventricles are proportionate to the degree of atrophy. No significant extra-axial fluid collection is present. Extensive atherosclerotic calcifications are present throughout the cavernous internal carotid arteries vertebrobasilar system. The paranasal sinuses and mastoid air cells are clear. A left supraorbital scalp hematoma measures 2.7 x 1.6 x 1.7 cm without an underlying fracture. CT MAXILLOFACIAL FINDINGS The left supraorbital scalp hematoma is again seen. There is no underlying  fracture. The paranasal sinuses and mastoid air cells are clear. Bilateral lens replacements are present. The globes and orbits are otherwise intact. There is no significant postseptal inflammation on the left. Minimal infraorbital edema is present on the left. The mandible is intact and located. Torus mandibularis are noted bilaterally. Soft tissues of the face and muscles of mastication are otherwise  within normal limits. Salivary glands are unremarkable. CT CERVICAL SPINE FINDINGS The cervical spine is imaged from the skull base through T2-3. Chronic loss of disc height is present at each level from C4-5 through C7-T1. There slight degenerative anterolisthesis at C4-5. Chronic endplate changes and uncovertebral spurring is present at these levels in particular. No acute fracture traumatic subluxation is present. Osseous foraminal narrowing is most pronounced at C5-6 and C6-7 bilaterally. There is heterogeneous enlargement of the thyroid bilaterally. No dominant nodule is present. The lung apices are clear. IMPRESSION: 1. Moderate atrophy and white matter disease without acute intracranial abnormality. 2. 2.7 x 1.6 x 1.7 cm left supraorbital scalp hematoma without underlying fracture or globe injury. 3. Minimal infraorbital soft tissue swelling without other significant facial trauma. 4. Moderate multilevel spondylosis of the cervical spine without evidence for acute fracture or traumatic subluxation. Electronically Signed   By: San Morelle M.D.   On: 08/21/2015 11:58  Pelvis Portable  Result Date: 08/22/2015 CLINICAL DATA:  Post ORIF left hip fracture. EXAM: PORTABLE PELVIS 1-2 VIEWS COMPARISON:  Preoperative radiographs yesterday FINDINGS: Interval intra medullary nail and like screw fixation of comminuted intertrochanteric left hip fracture. Alignment is improved from preoperative exam. No apparent immediate postoperative complication. No additional acute fracture of the bony pelvis. IMPRESSION:  Post ORIF intertrochanteric left hip fracture. No immediate postoperative complication. Electronically Signed   By: Jeb Levering M.D.   On: 08/22/2015 23:53   Dg Chest Portable 1 View  Result Date: 08/21/2015 CLINICAL DATA:  Unwitnessed fall from chair today.  Hip fracture. EXAM: PORTABLE CHEST 1 VIEW COMPARISON:  Two-view chest x-ray 01/02/2013 FINDINGS: The heart size is normal. Lung volumes remain low. Atherosclerotic changes are noted at the aortic arch. Chronic interstitial changes are stable. No acute fractures are present.  There is no pneumothorax. IMPRESSION: 1. No acute cardiopulmonary disease or significant interval change. 2. Stable chronic interstitial coarsening. 3. Atherosclerosis of the thoracic aorta. Electronically Signed   By: San Morelle M.D.   On: 08/21/2015 12:17  Dg C-arm 1-60 Min  Result Date: 08/22/2015 CLINICAL DATA:  ORIF left hip fracture. EXAM: DG C-ARM 61-120 MIN; LEFT FEMUR 2 VIEWS COMPARISON:  Preoperative radiographs. FINDINGS: Four fluoroscopic spot images from the operating room post fixation of intertrochanteric left hip fracture demonstrate intra medullary femoral nail with distal locking and proximal lag screws. The fracture is in improved alignment. Total fluoroscopy time 1 minutes 15 seconds. IMPRESSION: Intraoperative fluoroscopy during intertrochanteric left hip fracture ORIF. Electronically Signed   By: Jeb Levering M.D.   On: 08/22/2015 23:51   Dg Hip Unilat W Or Wo Pelvis 2-3 Views Left  Result Date: 08/21/2015 CLINICAL DATA:  Fall at home.  Left hip pain. EXAM: DG HIP (WITH OR WITHOUT PELVIS) 2-3V LEFT COMPARISON:  None. FINDINGS: There is a left femoral intertrochanteric fracture with mild displacement. Slight varus angulation. No subluxation or dislocation. Mild degenerative changes in the hips bilaterally. IMPRESSION: Left femoral intertrochanteric fracture with mild displacement and varus angulation. Electronically Signed   By: Rolm Baptise M.D.   On: 08/21/2015 11:29  Dg Femur Min 2 Views Left  Result Date: 08/22/2015 CLINICAL DATA:  ORIF left hip fracture. EXAM: DG C-ARM 61-120 MIN; LEFT FEMUR 2 VIEWS COMPARISON:  Preoperative radiographs. FINDINGS: Four fluoroscopic spot images from the operating room post fixation of intertrochanteric left hip fracture demonstrate intra medullary femoral nail with distal locking and proximal lag screws. The fracture is in improved alignment. Total fluoroscopy time 1 minutes 15  seconds. IMPRESSION: Intraoperative fluoroscopy during intertrochanteric left hip fracture ORIF. Electronically Signed   By: Jeb Levering M.D.   On: 08/22/2015 23:51   Dg Femur Min 2 Views Left  Result Date: 08/21/2015 CLINICAL DATA:  Unwitnessed fall. Patient complains of left hip pain. EXAM: LEFT FEMUR 2 VIEWS COMPARISON:  Hip radiograph 08/21/2015 FINDINGS: Re- demonstrated is left femoral intertrochanteric oblique fracture with mild displacement and varus angulation of the distal fracture fragment. There is generalized osteopenia. No distal femoral fractures are seen. IMPRESSION: Known left femoral intertrochanteric fracture with mild displacement and varus angulation. Osteopenia. Electronically Signed   By: Fidela Salisbury M.D.   On: 08/21/2015 13:11  Dg Femur Port Min 2 Views Left  Result Date: 08/22/2015 CLINICAL DATA:  Status post IM nail left femur. EXAM: LEFT FEMUR PORTABLE 2 VIEWS COMPARISON:  Preoperative radiographs yesterday. FINDINGS: Intra medullary nail with distal locking and proximal lag screw traverses intertrochanteric left hip fracture. Fracture is in improved alignment. Recent postsurgical change includes air in the subcutaneous soft tissues. No immediate complication is seen. IMPRESSION: ORIF of intertrochanteric left hip fracture in improved alignment. Electronically Signed   By: Jeb Levering M.D.   On: 08/22/2015 23:49   Ct Maxillofacial Wo Contrast  Result Date: 08/21/2015 CLINICAL  DATA:  Tripped and fell and chair today. Left supraorbital hematoma. Personal history of head trauma. No loss of consciousness. The patient was on the floor for 30 minutes prior to receiving help. EXAM: CT HEAD WITHOUT CONTRAST CT MAXILLOFACIAL WITHOUT CONTRAST CT CERVICAL SPINE WITHOUT CONTRAST TECHNIQUE: Multidetector CT imaging of the head, cervical spine, and maxillofacial structures were performed using the standard protocol without intravenous contrast. Multiplanar CT image reconstructions of the cervical spine and maxillofacial structures were also generated. COMPARISON:  CT head and cervical spine 01/02/2013. FINDINGS: CT HEAD FINDINGS Moderate generalized atrophy and diffuse white matter disease is present. No acute infarct, hemorrhage, or mass lesion is present. The ventricles are proportionate to the degree of atrophy. No significant extra-axial fluid collection is present. Extensive atherosclerotic calcifications are present throughout the cavernous internal carotid arteries vertebrobasilar system. The paranasal sinuses and mastoid air cells are clear. A left supraorbital scalp hematoma measures 2.7 x 1.6 x 1.7 cm without an underlying fracture. CT MAXILLOFACIAL FINDINGS The left supraorbital scalp hematoma is again seen. There is no underlying fracture. The paranasal sinuses and mastoid air cells are clear. Bilateral lens replacements are present. The globes and orbits are otherwise intact. There is no significant postseptal inflammation on the left. Minimal infraorbital edema is present on the left. The mandible is intact and located. Torus mandibularis are noted bilaterally. Soft tissues of the face and muscles of mastication are otherwise within normal limits. Salivary glands are unremarkable. CT CERVICAL SPINE FINDINGS The cervical spine is imaged from the skull base through T2-3. Chronic loss of disc height is present at each level from C4-5 through C7-T1. There slight degenerative anterolisthesis  at C4-5. Chronic endplate changes and uncovertebral spurring is present at these levels in particular. No acute fracture traumatic subluxation is present. Osseous foraminal narrowing is most pronounced at C5-6 and C6-7 bilaterally. There is heterogeneous enlargement of the thyroid bilaterally. No dominant nodule is present. The lung apices are clear. IMPRESSION: 1. Moderate atrophy and white matter disease without acute intracranial abnormality. 2. 2.7 x 1.6 x 1.7 cm left supraorbital scalp hematoma without underlying fracture or globe injury. 3. Minimal infraorbital soft tissue swelling without other significant facial trauma. 4. Moderate multilevel spondylosis of the cervical  spine without evidence for acute fracture or traumatic subluxation. Electronically Signed   By: San Morelle M.D.   On: 08/21/2015 11:58   Assessment/Plan There are no diagnoses linked to this encounter.   Family/ staff Communication:   Labs/tests ordered:

## 2015-08-30 NOTE — Assessment & Plan Note (Signed)
Stable, contineu Fiber Laxative daily, Senna S I bid.

## 2015-08-30 NOTE — Assessment & Plan Note (Signed)
08/30/15 Hgb a1c 5.7, continue monitor CBGs and diet control.

## 2015-08-31 LAB — CBC AND DIFFERENTIAL
HCT: 29 % — AB (ref 36–46)
HEMOGLOBIN: 9.5 g/dL — AB (ref 12.0–16.0)
PLATELETS: 276 10*3/uL (ref 150–399)
WBC: 7.5 10*3/mL

## 2015-09-01 ENCOUNTER — Other Ambulatory Visit: Payer: Self-pay | Admitting: *Deleted

## 2015-09-01 ENCOUNTER — Encounter: Payer: Self-pay | Admitting: Nurse Practitioner

## 2015-09-01 DIAGNOSIS — N390 Urinary tract infection, site not specified: Secondary | ICD-10-CM | POA: Insufficient documentation

## 2015-09-06 ENCOUNTER — Encounter: Payer: Self-pay | Admitting: Nurse Practitioner

## 2015-09-06 ENCOUNTER — Non-Acute Institutional Stay (SKILLED_NURSING_FACILITY): Payer: Medicare Other | Admitting: Nurse Practitioner

## 2015-09-06 DIAGNOSIS — F0390 Unspecified dementia without behavioral disturbance: Secondary | ICD-10-CM

## 2015-09-06 DIAGNOSIS — L89620 Pressure ulcer of left heel, unstageable: Secondary | ICD-10-CM | POA: Diagnosis not present

## 2015-09-06 DIAGNOSIS — S72002S Fracture of unspecified part of neck of left femur, sequela: Secondary | ICD-10-CM

## 2015-09-06 DIAGNOSIS — I10 Essential (primary) hypertension: Secondary | ICD-10-CM

## 2015-09-06 DIAGNOSIS — R319 Hematuria, unspecified: Secondary | ICD-10-CM

## 2015-09-06 DIAGNOSIS — D62 Acute posthemorrhagic anemia: Secondary | ICD-10-CM | POA: Diagnosis not present

## 2015-09-06 NOTE — Progress Notes (Signed)
Location:   [facility] Room Number: 27 Place of Service:  SNF (31) Provider:  Marlana Latus  NP    Patient Care Team: Estill Dooms, MD as PCP - General (Internal Medicine) Jeimy Bickert Otho Darner, NP as Nurse Practitioner (Internal Medicine)  Extended Emergency Contact Information Primary Emergency Contact: Hanzaker,[name] Address: [address]          [place], [place] [address] [place] of [place] Phone: [phone] Mobile Phone: [phone] Relation: Daughter Secondary Emergency [place]: [place] [address] [place] Phone: [phone] Relation: Grandson  Code Status:  DNR Goals of care: Advanced Directive information Advanced Directives 09/06/2015  Does patient have an advance directive? Yes  Type of Advance Directive Living will;Healthcare Power of Attorney  Does patient want to make changes to advanced directive? No - Patient declined  Copy of advanced directive(s) in chart? Yes  Pre-existing out of facility DNR order (yellow form or pink MOST form) -     Chief Complaint  Patient presents with  . Acute Visit    needs to be acessed for trip to the doctor's house per daughter    HPI:  Pt is a 82 y.o. female seen today for an acute visit for left heel ruptured blister wound, intact, no s/s of infection. S/p left hip surgical wounds, healed. No longer hematuria since UTI has been treated.    Post Op anemia, s/p PRBC transfusion, last Hgb 9.6 08/30/15, 09/01/15 9.5                       Hx of dementia, taking Aricept and Namenda, resides in SNF for care needs. Blood pressure is controlled on Metoprolol and Lisinopril     Past Medical History:  Diagnosis Date  . Atrial fibrillation ([place])   . Bradycardia   . Dementia   . Depression   . Diabetes mellitus without complication ([place])   . Fall at nursing home   . Glaucoma   . HOH (hard of hearing)   . Hypertension   . Lower back pain   . Melanoma  ([place]) 1960   left shoulder skin  . Muscle pain   . Peripheral neuropathy ([place])   . TBI (traumatic brain injury) ([place])   . Umbilical hernia   . Wears glasses    Past Surgical History:  Procedure Laterality Date  . FRACTURE SURGERY Left 08/22/2015   hip  . INTRAMEDULLARY (IM) NAIL INTERTROCHANTERIC Left 08/22/2015   Procedure: INTRAMEDULLARY (IM) NAIL INTERTROCHANTRIC;  Surgeon: Rod Can, MD;  Location: [facility];  Service: Orthopedics;  Laterality: Left;  . SKIN CANCER EXCISION Left 1960's   melanoma shoulder    Allergies  Allergen Reactions  . Codeine Other (See Comments)    Per [facility]      Medication List       Accurate as of 09/06/15 11:59 PM. Always use your most recent med list.          acetaminophen 325 MG tablet Commonly known as:  TYLENOL Take 650 mg by mouth every 4 (four) hours as needed for moderate pain.   aspirin 81 MG EC tablet Take 1 tablet (81 mg total) by mouth 2 (two) times daily with a meal.   CERTAVITE/ANTIOXIDANTS PO Take 1 tablet by mouth daily.   donepezil 10 MG tablet Commonly known as:  ARICEPT Take 10 mg by mouth at bedtime.   feeding supplement  Liqd Take 1 Container by mouth 2 (two) times daily between meals. Increase to 120 ml three time daily.   FIBER-CAPS PO Take 1 capsule by mouth daily.   ipratropium-albuterol 0.5-2.5 (3) MG/3ML Soln Commonly known as:  DUONEB Take 3 mLs by nebulization 2 (two) times daily as needed (for Shortness of breath).   lisinopril 40 MG tablet Commonly known as:  PRINIVIL,ZESTRIL Take 40 mg by mouth daily. For HTN   memantine 10 MG tablet Commonly known as:  NAMENDA Take 10 mg by mouth 2 (two) times daily.   metoprolol succinate 100 MG 24 hr tablet Commonly known as:  TOPROL-XL Take 100 mg by mouth daily. Take with or immediately following a meal.   sennosides-docusate sodium 8.6-50 MG tablet Commonly known as:  SENOKOT-S Take 1 tablet by mouth daily. Hold for loose stool       Review of  Systems  Constitutional: Positive for fatigue.  HENT: Negative.   Eyes: Positive for visual disturbance (corrective lenses).  Respiratory: Negative.   Cardiovascular: Negative for chest pain, palpitations and leg swelling.  Gastrointestinal: Negative.   Endocrine: Positive for polyuria.       Diabetic with neuropathy  Genitourinary: Positive for frequency. Negative for dysuria and flank pain.       Episodes of incontinence.   Musculoskeletal: Positive for gait problem.  Skin: Negative.        Facial ecchymoses, right eye brown hematoma. Left hip surgical incisions healed,  Left heal ruptured blister wound.   Allergic/Immunologic: Negative.   Neurological: Positive for light-headedness. Negative for tremors, seizures, syncope, facial asymmetry, speech difficulty, weakness, numbness and headaches.       Dementia  Hematological: Bruises/bleeds easily.  Psychiatric/Behavioral: Positive for confusion and sleep disturbance.    Immunization History  Administered Date(s) Administered  . Influenza-Unspecified 11/10/2012, 10/27/2013, 10/28/2014  . PPD Test 11/17/2011, 11/16/2014, 08/24/2015  . Pneumococcal Polysaccharide-23 11/23/1999  . Td 10/22/2000   Pertinent  Health Maintenance Due  Topic Date Due  . OPHTHALMOLOGY EXAM  09/24/1930  . DEXA SCAN  09/23/1985  . PNA vac Low Risk Adult (2 of 2 - PCV13) 11/22/2000  . INFLUENZA VACCINE  08/23/2015  . FOOT EXAM  10/26/2015  . HEMOGLOBIN A1C  02/29/2016   Fall Risk  11/29/2014 11/09/2014 10/26/2014 10/27/2013 10/27/2013  Falls in the past year? No No No No No  Risk for fall due to : - - - Impaired mobility;Impaired balance/gait;Mental status change -   Functional Status Survey:    Vitals:   09/06/15 1518  BP: 132/69  Pulse: 71  Resp: 18  Temp: 98.1 F (36.7 C)  SpO2: 93%  Weight: 132 lb 3.2 oz (60 kg)  Height: 5\' 1"  (1.549 m)   Body mass index is 24.98 kg/m. Physical Exam  Constitutional: She appears well-developed and  well-nourished.  HENT:  Head: Normocephalic and atraumatic.  Right Ear: External ear normal.  Left Ear: External ear normal.  Eyes: Conjunctivae and EOM are normal. Pupils are equal, round, and reactive to light.  Neck: Neck supple. No JVD present. No tracheal deviation present. No thyromegaly present.  Cardiovascular: Normal rate.   Murmur heard.  Systolic murmur is present with a grade of 2/6  Pulmonary/Chest: Effort normal and breath sounds normal. No respiratory distress. She has no rales.  Abdominal: Soft. Bowel sounds are normal. She exhibits no mass. There is no tenderness.  Musculoskeletal: She exhibits no edema or tenderness.  Unstable gait. Using rolling walker. Mild scoliosis of thoracic and lumbar  spine.  Lymphadenopathy:    She has no cervical adenopathy.  Neurological: She is alert. She has normal reflexes. No cranial nerve deficit. She exhibits normal muscle tone. Coordination normal.  Demented  Skin: Skin is warm and dry. No rash noted. No erythema.  Facial ecchymoses, right eye brown hematoma. Left hip surgical incisions healed,  Left heal ruptured blister wound.    Psychiatric: She has a normal mood and affect. Her behavior is normal. Thought content normal. Her speech is not delayed and not slurred. She is not agitated, not aggressive, not hyperactive, not slowed, not withdrawn, not actively hallucinating and not combative. Thought content is not paranoid and not delusional. Cognition and memory are impaired. She exhibits abnormal recent memory.    Labs reviewed:  Recent Labs  08/22/15 0429 08/22/15 1020 08/23/15 0514 08/23/15 0730 08/24/15 0543 08/29/15  NA 137  --  137  --  137 141  K 4.3  --  3.0*  --  3.7 3.8  CL 107  --  106  --  107  --   CO2 23  --  21*  --  23  --   GLUCOSE 105*  --  157*  --  145*  --   BUN 13  --  11  --  19 22*  CREATININE 0.66  --  0.86  --  0.76 0.6  CALCIUM 8.5*  --  8.2*  --  8.4*  --   MG  --  1.6*  --  1.8  --   --      Recent Labs  08/21/15 1209 08/22/15 0429 08/29/15  AST 20 15 17   ALT 17 13* 13  ALKPHOS 66 51 50  BILITOT 0.7 1.2  --   PROT 7.2 5.9*  --   ALBUMIN 4.3 3.6  --     Recent Labs  08/21/15 1209 08/22/15 0429 08/23/15 0514 08/24/15 0543 08/24/15 1433 08/29/15 08/31/15  WBC 12.0* 8.0 13.5* 8.9 10.8* 7.9 7.5  NEUTROABS 10.6* 5.4  --   --   --   --   --   HGB 13.9 11.5* 9.0* 7.9* 9.5* 9.6* 9.5*  HCT 43.0 34.6* 28.0* 24.3* 29.4* 29* 29*  MCV 84.5 83.6 84.6 83.2 85.0  --   --   PLT 177 160 131* 122* 119* 223 276   Lab Results  Component Value Date   TSH 0.88 05/05/2015   Lab Results  Component Value Date   HGBA1C 5.7 08/29/2015   Lab Results  Component Value Date   CHOL 191 05/20/2012   HDL 38 (L) 05/20/2012   LDLCALC 118 (H) 05/20/2012   TRIG 174 (H) 05/20/2012   CHOLHDL 5.0 05/20/2012    Significant Diagnostic Results in last 30 days:  Ct Head Wo Contrast  Result Date: 08/21/2015 CLINICAL DATA:  Tripped and fell and chair today. Left supraorbital hematoma. Personal history of head trauma. No loss of consciousness. The patient was on the floor for 30 minutes prior to receiving help. EXAM: CT HEAD WITHOUT CONTRAST CT MAXILLOFACIAL WITHOUT CONTRAST CT CERVICAL SPINE WITHOUT CONTRAST TECHNIQUE: Multidetector CT imaging of the head, cervical spine, and maxillofacial structures were performed using the standard protocol without intravenous contrast. Multiplanar CT image reconstructions of the cervical spine and maxillofacial structures were also generated. COMPARISON:  CT head and cervical spine 01/02/2013. FINDINGS: CT HEAD FINDINGS Moderate generalized atrophy and diffuse white matter disease is present. No acute infarct, hemorrhage, or mass lesion is present. The ventricles are proportionate to the  degree of atrophy. No significant extra-axial fluid collection is present. Extensive atherosclerotic calcifications are present throughout the cavernous internal carotid arteries  vertebrobasilar system. The paranasal sinuses and mastoid air cells are clear. A left supraorbital scalp hematoma measures 2.7 x 1.6 x 1.7 cm without an underlying fracture. CT MAXILLOFACIAL FINDINGS The left supraorbital scalp hematoma is again seen. There is no underlying fracture. The paranasal sinuses and mastoid air cells are clear. Bilateral lens replacements are present. The globes and orbits are otherwise intact. There is no significant postseptal inflammation on the left. Minimal infraorbital edema is present on the left. The mandible is intact and located. Torus mandibularis are noted bilaterally. Soft tissues of the face and muscles of mastication are otherwise within normal limits. Salivary glands are unremarkable. CT CERVICAL SPINE FINDINGS The cervical spine is imaged from the skull base through T2-3. Chronic loss of disc height is present at each level from C4-5 through C7-T1. There slight degenerative anterolisthesis at C4-5. Chronic endplate changes and uncovertebral spurring is present at these levels in particular. No acute fracture traumatic subluxation is present. Osseous foraminal narrowing is most pronounced at C5-6 and C6-7 bilaterally. There is heterogeneous enlargement of the thyroid bilaterally. No dominant nodule is present. The lung apices are clear. IMPRESSION: 1. Moderate atrophy and white matter disease without acute intracranial abnormality. 2. 2.7 x 1.6 x 1.7 cm left supraorbital scalp hematoma without underlying fracture or globe injury. 3. Minimal infraorbital soft tissue swelling without other significant facial trauma. 4. Moderate multilevel spondylosis of the cervical spine without evidence for acute fracture or traumatic subluxation. Electronically Signed   By: San Morelle M.D.   On: 08/21/2015 11:58  Ct Cervical Spine Wo Contrast  Result Date: 08/21/2015 CLINICAL DATA:  Tripped and fell and chair today. Left supraorbital hematoma. Personal history of head trauma.  No loss of consciousness. The patient was on the floor for 30 minutes prior to receiving help. EXAM: CT HEAD WITHOUT CONTRAST CT MAXILLOFACIAL WITHOUT CONTRAST CT CERVICAL SPINE WITHOUT CONTRAST TECHNIQUE: Multidetector CT imaging of the head, cervical spine, and maxillofacial structures were performed using the standard protocol without intravenous contrast. Multiplanar CT image reconstructions of the cervical spine and maxillofacial structures were also generated. COMPARISON:  CT head and cervical spine 01/02/2013. FINDINGS: CT HEAD FINDINGS Moderate generalized atrophy and diffuse white matter disease is present. No acute infarct, hemorrhage, or mass lesion is present. The ventricles are proportionate to the degree of atrophy. No significant extra-axial fluid collection is present. Extensive atherosclerotic calcifications are present throughout the cavernous internal carotid arteries vertebrobasilar system. The paranasal sinuses and mastoid air cells are clear. A left supraorbital scalp hematoma measures 2.7 x 1.6 x 1.7 cm without an underlying fracture. CT MAXILLOFACIAL FINDINGS The left supraorbital scalp hematoma is again seen. There is no underlying fracture. The paranasal sinuses and mastoid air cells are clear. Bilateral lens replacements are present. The globes and orbits are otherwise intact. There is no significant postseptal inflammation on the left. Minimal infraorbital edema is present on the left. The mandible is intact and located. Torus mandibularis are noted bilaterally. Soft tissues of the face and muscles of mastication are otherwise within normal limits. Salivary glands are unremarkable. CT CERVICAL SPINE FINDINGS The cervical spine is imaged from the skull base through T2-3. Chronic loss of disc height is present at each level from C4-5 through C7-T1. There slight degenerative anterolisthesis at C4-5. Chronic endplate changes and uncovertebral spurring is present at these levels in particular.  No acute fracture  traumatic subluxation is present. Osseous foraminal narrowing is most pronounced at C5-6 and C6-7 bilaterally. There is heterogeneous enlargement of the thyroid bilaterally. No dominant nodule is present. The lung apices are clear. IMPRESSION: 1. Moderate atrophy and white matter disease without acute intracranial abnormality. 2. 2.7 x 1.6 x 1.7 cm left supraorbital scalp hematoma without underlying fracture or globe injury. 3. Minimal infraorbital soft tissue swelling without other significant facial trauma. 4. Moderate multilevel spondylosis of the cervical spine without evidence for acute fracture or traumatic subluxation. Electronically Signed   By: San Morelle M.D.   On: 08/21/2015 11:58  Pelvis Portable  Result Date: 08/22/2015 CLINICAL DATA:  Post ORIF left hip fracture. EXAM: PORTABLE PELVIS 1-2 VIEWS COMPARISON:  Preoperative radiographs yesterday FINDINGS: Interval intra medullary nail and like screw fixation of comminuted intertrochanteric left hip fracture. Alignment is improved from preoperative exam. No apparent immediate postoperative complication. No additional acute fracture of the bony pelvis. IMPRESSION: Post ORIF intertrochanteric left hip fracture. No immediate postoperative complication. Electronically Signed   By: Jeb Levering M.D.   On: 08/22/2015 23:53   Dg Chest Portable 1 View  Result Date: 08/21/2015 CLINICAL DATA:  Unwitnessed fall from chair today.  Hip fracture. EXAM: PORTABLE CHEST 1 VIEW COMPARISON:  Two-view chest x-ray 01/02/2013 FINDINGS: The heart size is normal. Lung volumes remain low. Atherosclerotic changes are noted at the aortic arch. Chronic interstitial changes are stable. No acute fractures are present.  There is no pneumothorax. IMPRESSION: 1. No acute cardiopulmonary disease or significant interval change. 2. Stable chronic interstitial coarsening. 3. Atherosclerosis of the thoracic aorta. Electronically Signed   By: San Morelle M.D.   On: 08/21/2015 12:17  Dg C-arm 1-60 Min  Result Date: 08/22/2015 CLINICAL DATA:  ORIF left hip fracture. EXAM: DG C-ARM 61-120 MIN; LEFT FEMUR 2 VIEWS COMPARISON:  Preoperative radiographs. FINDINGS: Four fluoroscopic spot images from the operating room post fixation of intertrochanteric left hip fracture demonstrate intra medullary femoral nail with distal locking and proximal lag screws. The fracture is in improved alignment. Total fluoroscopy time 1 minutes 15 seconds. IMPRESSION: Intraoperative fluoroscopy during intertrochanteric left hip fracture ORIF. Electronically Signed   By: Jeb Levering M.D.   On: 08/22/2015 23:51   Dg Hip Unilat W Or Wo Pelvis 2-3 Views Left  Result Date: 08/21/2015 CLINICAL DATA:  Fall at home.  Left hip pain. EXAM: DG HIP (WITH OR WITHOUT PELVIS) 2-3V LEFT COMPARISON:  None. FINDINGS: There is a left femoral intertrochanteric fracture with mild displacement. Slight varus angulation. No subluxation or dislocation. Mild degenerative changes in the hips bilaterally. IMPRESSION: Left femoral intertrochanteric fracture with mild displacement and varus angulation. Electronically Signed   By: Rolm Baptise M.D.   On: 08/21/2015 11:29  Dg Femur Min 2 Views Left  Result Date: 08/22/2015 CLINICAL DATA:  ORIF left hip fracture. EXAM: DG C-ARM 61-120 MIN; LEFT FEMUR 2 VIEWS COMPARISON:  Preoperative radiographs. FINDINGS: Four fluoroscopic spot images from the operating room post fixation of intertrochanteric left hip fracture demonstrate intra medullary femoral nail with distal locking and proximal lag screws. The fracture is in improved alignment. Total fluoroscopy time 1 minutes 15 seconds. IMPRESSION: Intraoperative fluoroscopy during intertrochanteric left hip fracture ORIF. Electronically Signed   By: Jeb Levering M.D.   On: 08/22/2015 23:51   Dg Femur Min 2 Views Left  Result Date: 08/21/2015 CLINICAL DATA:  Unwitnessed fall. Patient complains of left  hip pain. EXAM: LEFT FEMUR 2 VIEWS COMPARISON:  Hip radiograph 08/21/2015 FINDINGS:  Re- demonstrated is left femoral intertrochanteric oblique fracture with mild displacement and varus angulation of the distal fracture fragment. There is generalized osteopenia. No distal femoral fractures are seen. IMPRESSION: Known left femoral intertrochanteric fracture with mild displacement and varus angulation. Osteopenia. Electronically Signed   By: Fidela Salisbury M.D.   On: 08/21/2015 13:11  Dg Femur Port Min 2 Views Left  Result Date: 08/22/2015 CLINICAL DATA:  Status post IM nail left femur. EXAM: LEFT FEMUR PORTABLE 2 VIEWS COMPARISON:  Preoperative radiographs yesterday. FINDINGS: Intra medullary nail with distal locking and proximal lag screw traverses intertrochanteric left hip fracture. Fracture is in improved alignment. Recent postsurgical change includes air in the subcutaneous soft tissues. No immediate complication is seen. IMPRESSION: ORIF of intertrochanteric left hip fracture in improved alignment. Electronically Signed   By: Jeb Levering M.D.   On: 08/22/2015 23:49   Ct Maxillofacial Wo Contrast  Result Date: 08/21/2015 CLINICAL DATA:  Tripped and fell and chair today. Left supraorbital hematoma. Personal history of head trauma. No loss of consciousness. The patient was on the floor for 30 minutes prior to receiving help. EXAM: CT HEAD WITHOUT CONTRAST CT MAXILLOFACIAL WITHOUT CONTRAST CT CERVICAL SPINE WITHOUT CONTRAST TECHNIQUE: Multidetector CT imaging of the head, cervical spine, and maxillofacial structures were performed using the standard protocol without intravenous contrast. Multiplanar CT image reconstructions of the cervical spine and maxillofacial structures were also generated. COMPARISON:  CT head and cervical spine 01/02/2013. FINDINGS: CT HEAD FINDINGS Moderate generalized atrophy and diffuse white matter disease is present. No acute infarct, hemorrhage, or mass lesion is  present. The ventricles are proportionate to the degree of atrophy. No significant extra-axial fluid collection is present. Extensive atherosclerotic calcifications are present throughout the cavernous internal carotid arteries vertebrobasilar system. The paranasal sinuses and mastoid air cells are clear. A left supraorbital scalp hematoma measures 2.7 x 1.6 x 1.7 cm without an underlying fracture. CT MAXILLOFACIAL FINDINGS The left supraorbital scalp hematoma is again seen. There is no underlying fracture. The paranasal sinuses and mastoid air cells are clear. Bilateral lens replacements are present. The globes and orbits are otherwise intact. There is no significant postseptal inflammation on the left. Minimal infraorbital edema is present on the left. The mandible is intact and located. Torus mandibularis are noted bilaterally. Soft tissues of the face and muscles of mastication are otherwise within normal limits. Salivary glands are unremarkable. CT CERVICAL SPINE FINDINGS The cervical spine is imaged from the skull base through T2-3. Chronic loss of disc height is present at each level from C4-5 through C7-T1. There slight degenerative anterolisthesis at C4-5. Chronic endplate changes and uncovertebral spurring is present at these levels in particular. No acute fracture traumatic subluxation is present. Osseous foraminal narrowing is most pronounced at C5-6 and C6-7 bilaterally. There is heterogeneous enlargement of the thyroid bilaterally. No dominant nodule is present. The lung apices are clear. IMPRESSION: 1. Moderate atrophy and white matter disease without acute intracranial abnormality. 2. 2.7 x 1.6 x 1.7 cm left supraorbital scalp hematoma without underlying fracture or globe injury. 3. Minimal infraorbital soft tissue swelling without other significant facial trauma. 4. Moderate multilevel spondylosis of the cervical spine without evidence for acute fracture or traumatic subluxation. Electronically  Signed   By: San Morelle M.D.   On: 08/21/2015 11:58   Assessment/Plan There are no diagnoses linked to this encounter.Hematuria 09/01/15 Hgb 9.5, resolved hematuria since UTI was treated.   Acute blood loss anemia S/p 1 unit RRBC transfusion, update CBC, may consider  dc ASA if Hgb drops upon dc summary 08/30/15 Hgb 9.6, hematuria resolved after ABT started for UTI, repeated Hgb 9.5 08/30/15 Resumed ASA   Dementia Gradual decline, continue Namenda and Aricept, SNF for care needs.    HTN (hypertension) controlled, continue Lisinopril 40mg , Metoprolol succinate 100mg , 08/30/15 Na 141, K 3.8, Bun 22, creat 0.56    Closed left hip fracture (HCC) Healed surgical wounds  Pressure ulcer of left heel Left heel ruptured blister wound, continue to be off pressure, monitor      Family/ staff Communication: continue SNF for care assistance, diet as tolerated.   Labs/tests ordered:  none

## 2015-09-07 DIAGNOSIS — L089 Local infection of the skin and subcutaneous tissue, unspecified: Secondary | ICD-10-CM | POA: Insufficient documentation

## 2015-09-07 DIAGNOSIS — L8990 Pressure ulcer of unspecified site, unspecified stage: Secondary | ICD-10-CM

## 2015-09-07 NOTE — Assessment & Plan Note (Signed)
controlled, continue Lisinopril 40mg , Metoprolol succinate 100mg , 08/30/15 Na 141, K 3.8, Bun 22, creat 0.56

## 2015-09-07 NOTE — Assessment & Plan Note (Signed)
Healed surgical wounds

## 2015-09-07 NOTE — Assessment & Plan Note (Signed)
Gradual decline, continue Namenda and Aricept, SNF for care needs.

## 2015-09-07 NOTE — Assessment & Plan Note (Signed)
Left heel ruptured blister wound, continue to be off pressure, monitor

## 2015-09-07 NOTE — Assessment & Plan Note (Signed)
S/p 1 unit RRBC transfusion, update CBC, may consider dc ASA if Hgb drops upon dc summary 08/30/15 Hgb 9.6, hematuria resolved after ABT started for UTI, repeated Hgb 9.5 08/30/15 Resumed ASA

## 2015-09-07 NOTE — Assessment & Plan Note (Signed)
09/01/15 Hgb 9.5, resolved hematuria since UTI was treated.

## 2015-09-08 ENCOUNTER — Encounter: Payer: Self-pay | Admitting: Nurse Practitioner

## 2015-09-08 ENCOUNTER — Non-Acute Institutional Stay (SKILLED_NURSING_FACILITY): Payer: Medicare Other | Admitting: Nurse Practitioner

## 2015-09-08 DIAGNOSIS — F0391 Unspecified dementia with behavioral disturbance: Secondary | ICD-10-CM

## 2015-09-08 DIAGNOSIS — L89622 Pressure ulcer of left heel, stage 2: Secondary | ICD-10-CM

## 2015-09-08 DIAGNOSIS — F411 Generalized anxiety disorder: Secondary | ICD-10-CM

## 2015-09-08 DIAGNOSIS — I10 Essential (primary) hypertension: Secondary | ICD-10-CM | POA: Diagnosis not present

## 2015-09-08 DIAGNOSIS — K5900 Constipation, unspecified: Secondary | ICD-10-CM | POA: Diagnosis not present

## 2015-09-08 DIAGNOSIS — R634 Abnormal weight loss: Secondary | ICD-10-CM | POA: Diagnosis not present

## 2015-09-08 DIAGNOSIS — D62 Acute posthemorrhagic anemia: Secondary | ICD-10-CM

## 2015-09-08 DIAGNOSIS — R441 Visual hallucinations: Secondary | ICD-10-CM

## 2015-09-08 NOTE — Assessment & Plan Note (Signed)
#  10 Ibs since amission after the left hip surgery, poor oral intake, update CBC, CMP, UA C./S

## 2015-09-08 NOTE — Progress Notes (Signed)
Location:   [facility] Room Number: N-27 Place of Service:  SNF (31) Provider:  Marlana Latus  NP   Patient Care Team: Estill Dooms, MD as PCP - General (Internal Medicine) Maddison Kilner Otho Darner, NP as Nurse Practitioner (Internal Medicine)  Extended Emergency Contact Information Primary Emergency Contact: Hanzaker,[name] Address: [address]          [place], [place] [address] [place] of [place] Phone: [phone] Mobile Phone: [phone] Relation: Daughter Secondary Emergency [place]: [place] [address] [place] Phone: [phone] Relation: Grandson  Code Status:  DNR Goals of care: Advanced Directive information Advanced Directives 09/08/2015  Does patient have an advance directive? Yes  Type of Paramedic of [place];Living will  Does patient want to make changes to advanced directive? No - Patient declined  Copy of advanced directive(s) in chart? Yes  Pre-existing out of facility DNR order (yellow form or pink MOST form) -     Chief Complaint  Patient presents with  . Acute Visit    access (L) heel, hallucinations    HPI:  Pt is a 82 y.o. female seen today for an acute visit for Left heal ruptured blister wound, small intact skin remained medial aspect of the wound, superficial stage II pressure ulcer, no s/s of infection, pressure reduction, foam dressing    Reported visual hallucination, poor oral intake, completed ABX for UTI, weight loss #10 since admission. Afebrile, no cough or O2 desaturation.      Post Op anemia, s/p PRBC transfusion, last Hgb 9.6 08/30/15, 09/01/15 9.5 Hx of dementia, taking Aricept and Namenda, resides in SNF for care needs. Blood pressure is controlled on Metoprolol and Lisinopril    Past Medical History:  Diagnosis Date  . Atrial fibrillation ([place])   . Bradycardia   . Dementia   . Depression   . Diabetes  mellitus without complication ([place])   . Fall at nursing home   . Glaucoma   . HOH (hard of hearing)   . Hypertension   . Lower back pain   . Melanoma ([place]) 1960   left shoulder skin  . Muscle pain   . Peripheral neuropathy ([place])   . TBI (traumatic brain injury) ([place])   . Umbilical hernia   . Wears glasses    Past Surgical History:  Procedure Laterality Date  . FRACTURE SURGERY Left 08/22/2015   hip  . INTRAMEDULLARY (IM) NAIL INTERTROCHANTERIC Left 08/22/2015   Procedure: INTRAMEDULLARY (IM) NAIL INTERTROCHANTRIC;  Surgeon: Rod Can, MD;  Location: [facility];  Service: Orthopedics;  Laterality: Left;  . SKIN CANCER EXCISION Left 1960's   melanoma shoulder    Allergies  Allergen Reactions  . Codeine Other (See Comments)    Per [facility]      Medication List       Accurate as of 09/08/15 12:36 PM. Always use your most recent med list.          acetaminophen 325 MG tablet Commonly known as:  TYLENOL Take 650 mg by mouth every 4 (four) hours as needed for moderate pain.   aspirin 81 MG EC tablet Take 1 tablet (81 mg total) by mouth 2 (two) times daily with a meal.   CERTAVITE/ANTIOXIDANTS PO Take 1 tablet by mouth daily.   donepezil 10 MG tablet Commonly known as:  ARICEPT Take 10 mg by mouth at bedtime.   feeding supplement Liqd Take 1  Container by mouth 2 (two) times daily between meals. Increase to 120 ml three time daily.   FIBER-CAPS PO Take 1 capsule by mouth daily.   ipratropium-albuterol 0.5-2.5 (3) MG/3ML Soln Commonly known as:  DUONEB Take 3 mLs by nebulization 2 (two) times daily as needed (for Shortness of breath).   lisinopril 40 MG tablet Commonly known as:  PRINIVIL,ZESTRIL Take 40 mg by mouth daily. For HTN   memantine 10 MG tablet Commonly known as:  NAMENDA Take 10 mg by mouth 2 (two) times daily.   metoprolol succinate 100 MG 24 hr tablet Commonly known as:  TOPROL-XL Take 100 mg by mouth daily. Take with or immediately following a  meal.   sennosides-docusate sodium 8.6-50 MG tablet Commonly known as:  SENOKOT-S Take 1 tablet by mouth daily. Hold for loose stool   traMADol 50 MG tablet Commonly known as:  ULTRAM Take by mouth every 6 (six) hours as needed.       Review of Systems  Constitutional: Positive for activity change, appetite change and fatigue.  HENT: Negative.   Eyes: Positive for visual disturbance (corrective lenses).  Respiratory: Negative.   Cardiovascular: Negative for chest pain, palpitations and leg swelling.  Gastrointestinal: Negative.   Endocrine: Positive for polyuria.       Diabetic with neuropathy  Genitourinary: Positive for frequency. Negative for dysuria and flank pain.       Episodes of incontinence.   Musculoskeletal: Positive for gait problem.  Skin: Negative.        Facial ecchymoses, right eye brown hematoma. Left hip surgical incisions healed,  Left heal ruptured blister wound.   Allergic/Immunologic: Negative.   Neurological: Positive for light-headedness. Negative for tremors, seizures, syncope, facial asymmetry, speech difficulty, weakness, numbness and headaches.       Dementia  Hematological: Bruises/bleeds easily.  Psychiatric/Behavioral: Positive for confusion, hallucinations and sleep disturbance.    Immunization History  Administered Date(s) Administered  . Influenza-Unspecified 11/10/2012, 10/27/2013, 10/28/2014  . PPD Test 11/17/2011, 11/16/2014, 08/24/2015  . Pneumococcal Polysaccharide-23 11/23/1999  . Td 10/22/2000   Pertinent  Health Maintenance Due  Topic Date Due  . OPHTHALMOLOGY EXAM  09/24/1930  . DEXA SCAN  09/23/1985  . PNA vac Low Risk Adult (2 of 2 - PCV13) 11/22/2000  . INFLUENZA VACCINE  08/23/2015  . FOOT EXAM  10/26/2015  . HEMOGLOBIN A1C  02/29/2016   Fall Risk  11/29/2014 11/09/2014 10/26/2014 10/27/2013 10/27/2013  Falls in the past year? No No No No No  Risk for fall due to : - - - Impaired mobility;Impaired balance/gait;Mental  status change -   Functional Status Survey:    Vitals:   09/08/15 1150  BP: 132/69  Pulse: 71  Resp: 18  Temp: 98.1 F (36.7 C)  SpO2: 93%  Weight: 132 lb 3.2 oz (60 kg)  Height: 4\' 11"  (1.499 m)   Body mass index is 26.7 kg/m. Physical Exam  Constitutional: She appears well-developed and well-nourished.  HENT:  Head: Normocephalic and atraumatic.  Right Ear: External ear normal.  Left Ear: External ear normal.  Eyes: Conjunctivae and EOM are normal. Pupils are equal, round, and reactive to light.  Neck: Neck supple. No JVD present. No tracheal deviation present. No thyromegaly present.  Cardiovascular: Normal rate.   Murmur heard.  Systolic murmur is present with a grade of 2/6  Pulmonary/Chest: Effort normal and breath sounds normal. No respiratory distress. She has no rales.  Abdominal: Soft. Bowel sounds are normal. She exhibits no mass. There  is no tenderness.  Musculoskeletal: She exhibits no edema or tenderness.  Unstable gait. Using rolling walker. Mild scoliosis of thoracic and lumbar spine.  Lymphadenopathy:    She has no cervical adenopathy.  Neurological: She is alert. She has normal reflexes. No cranial nerve deficit. She exhibits normal muscle tone. Coordination normal.  Demented  Skin: Skin is warm and dry. No rash noted. No erythema.  Facial ecchymoses, right eye brown hematoma. Left hip surgical incisions healed,  Left heal ruptured blister wound, small intact skin remained medial aspect of the wound, superficial stage II pressure ulcer, no s/s of infection, pressure reduction, foam dressing    Psychiatric: She has a normal mood and affect. Her behavior is normal. Thought content normal. Her speech is not delayed and not slurred. She is not agitated, not aggressive, not hyperactive, not slowed, not withdrawn, not actively hallucinating and not combative. Thought content is not paranoid and not delusional. Cognition and memory are impaired. She exhibits  abnormal recent memory.    Labs reviewed:  Recent Labs  08/22/15 0429 08/22/15 1020 08/23/15 0514 08/23/15 0730 08/24/15 0543 08/29/15  NA 137  --  137  --  137 141  K 4.3  --  3.0*  --  3.7 3.8  CL 107  --  106  --  107  --   CO2 23  --  21*  --  23  --   GLUCOSE 105*  --  157*  --  145*  --   BUN 13  --  11  --  19 22*  CREATININE 0.66  --  0.86  --  0.76 0.6  CALCIUM 8.5*  --  8.2*  --  8.4*  --   MG  --  1.6*  --  1.8  --   --     Recent Labs  08/21/15 1209 08/22/15 0429 08/29/15  AST 20 15 17   ALT 17 13* 13  ALKPHOS 66 51 50  BILITOT 0.7 1.2  --   PROT 7.2 5.9*  --   ALBUMIN 4.3 3.6  --     Recent Labs  08/21/15 1209 08/22/15 0429 08/23/15 0514 08/24/15 0543 08/24/15 1433 08/29/15 08/31/15  WBC 12.0* 8.0 13.5* 8.9 10.8* 7.9 7.5  NEUTROABS 10.6* 5.4  --   --   --   --   --   HGB 13.9 11.5* 9.0* 7.9* 9.5* 9.6* 9.5*  HCT 43.0 34.6* 28.0* 24.3* 29.4* 29* 29*  MCV 84.5 83.6 84.6 83.2 85.0  --   --   PLT 177 160 131* 122* 119* 223 276   Lab Results  Component Value Date   TSH 0.88 05/05/2015   Lab Results  Component Value Date   HGBA1C 5.7 08/29/2015   Lab Results  Component Value Date   CHOL 191 05/20/2012   HDL 38 (L) 05/20/2012   LDLCALC 118 (H) 05/20/2012   TRIG 174 (H) 05/20/2012   CHOLHDL 5.0 05/20/2012    Significant Diagnostic Results in last 30 days:  Ct Head Wo Contrast  Result Date: 08/21/2015 CLINICAL DATA:  Tripped and fell and chair today. Left supraorbital hematoma. Personal history of head trauma. No loss of consciousness. The patient was on the floor for 30 minutes prior to receiving help. EXAM: CT HEAD WITHOUT CONTRAST CT MAXILLOFACIAL WITHOUT CONTRAST CT CERVICAL SPINE WITHOUT CONTRAST TECHNIQUE: Multidetector CT imaging of the head, cervical spine, and maxillofacial structures were performed using the standard protocol without intravenous contrast. Multiplanar CT image reconstructions of the  cervical spine and maxillofacial  structures were also generated. COMPARISON:  CT head and cervical spine 01/02/2013. FINDINGS: CT HEAD FINDINGS Moderate generalized atrophy and diffuse white matter disease is present. No acute infarct, hemorrhage, or mass lesion is present. The ventricles are proportionate to the degree of atrophy. No significant extra-axial fluid collection is present. Extensive atherosclerotic calcifications are present throughout the cavernous internal carotid arteries vertebrobasilar system. The paranasal sinuses and mastoid air cells are clear. A left supraorbital scalp hematoma measures 2.7 x 1.6 x 1.7 cm without an underlying fracture. CT MAXILLOFACIAL FINDINGS The left supraorbital scalp hematoma is again seen. There is no underlying fracture. The paranasal sinuses and mastoid air cells are clear. Bilateral lens replacements are present. The globes and orbits are otherwise intact. There is no significant postseptal inflammation on the left. Minimal infraorbital edema is present on the left. The mandible is intact and located. Torus mandibularis are noted bilaterally. Soft tissues of the face and muscles of mastication are otherwise within normal limits. Salivary glands are unremarkable. CT CERVICAL SPINE FINDINGS The cervical spine is imaged from the skull base through T2-3. Chronic loss of disc height is present at each level from C4-5 through C7-T1. There slight degenerative anterolisthesis at C4-5. Chronic endplate changes and uncovertebral spurring is present at these levels in particular. No acute fracture traumatic subluxation is present. Osseous foraminal narrowing is most pronounced at C5-6 and C6-7 bilaterally. There is heterogeneous enlargement of the thyroid bilaterally. No dominant nodule is present. The lung apices are clear. IMPRESSION: 1. Moderate atrophy and white matter disease without acute intracranial abnormality. 2. 2.7 x 1.6 x 1.7 cm left supraorbital scalp hematoma without underlying fracture or globe  injury. 3. Minimal infraorbital soft tissue swelling without other significant facial trauma. 4. Moderate multilevel spondylosis of the cervical spine without evidence for acute fracture or traumatic subluxation. Electronically Signed   By: San Morelle M.D.   On: 08/21/2015 11:58  Ct Cervical Spine Wo Contrast  Result Date: 08/21/2015 CLINICAL DATA:  Tripped and fell and chair today. Left supraorbital hematoma. Personal history of head trauma. No loss of consciousness. The patient was on the floor for 30 minutes prior to receiving help. EXAM: CT HEAD WITHOUT CONTRAST CT MAXILLOFACIAL WITHOUT CONTRAST CT CERVICAL SPINE WITHOUT CONTRAST TECHNIQUE: Multidetector CT imaging of the head, cervical spine, and maxillofacial structures were performed using the standard protocol without intravenous contrast. Multiplanar CT image reconstructions of the cervical spine and maxillofacial structures were also generated. COMPARISON:  CT head and cervical spine 01/02/2013. FINDINGS: CT HEAD FINDINGS Moderate generalized atrophy and diffuse white matter disease is present. No acute infarct, hemorrhage, or mass lesion is present. The ventricles are proportionate to the degree of atrophy. No significant extra-axial fluid collection is present. Extensive atherosclerotic calcifications are present throughout the cavernous internal carotid arteries vertebrobasilar system. The paranasal sinuses and mastoid air cells are clear. A left supraorbital scalp hematoma measures 2.7 x 1.6 x 1.7 cm without an underlying fracture. CT MAXILLOFACIAL FINDINGS The left supraorbital scalp hematoma is again seen. There is no underlying fracture. The paranasal sinuses and mastoid air cells are clear. Bilateral lens replacements are present. The globes and orbits are otherwise intact. There is no significant postseptal inflammation on the left. Minimal infraorbital edema is present on the left. The mandible is intact and located. Torus  mandibularis are noted bilaterally. Soft tissues of the face and muscles of mastication are otherwise within normal limits. Salivary glands are unremarkable. CT CERVICAL SPINE FINDINGS The cervical  spine is imaged from the skull base through T2-3. Chronic loss of disc height is present at each level from C4-5 through C7-T1. There slight degenerative anterolisthesis at C4-5. Chronic endplate changes and uncovertebral spurring is present at these levels in particular. No acute fracture traumatic subluxation is present. Osseous foraminal narrowing is most pronounced at C5-6 and C6-7 bilaterally. There is heterogeneous enlargement of the thyroid bilaterally. No dominant nodule is present. The lung apices are clear. IMPRESSION: 1. Moderate atrophy and white matter disease without acute intracranial abnormality. 2. 2.7 x 1.6 x 1.7 cm left supraorbital scalp hematoma without underlying fracture or globe injury. 3. Minimal infraorbital soft tissue swelling without other significant facial trauma. 4. Moderate multilevel spondylosis of the cervical spine without evidence for acute fracture or traumatic subluxation. Electronically Signed   By: San Morelle M.D.   On: 08/21/2015 11:58  Pelvis Portable  Result Date: 08/22/2015 CLINICAL DATA:  Post ORIF left hip fracture. EXAM: PORTABLE PELVIS 1-2 VIEWS COMPARISON:  Preoperative radiographs yesterday FINDINGS: Interval intra medullary nail and like screw fixation of comminuted intertrochanteric left hip fracture. Alignment is improved from preoperative exam. No apparent immediate postoperative complication. No additional acute fracture of the bony pelvis. IMPRESSION: Post ORIF intertrochanteric left hip fracture. No immediate postoperative complication. Electronically Signed   By: Jeb Levering M.D.   On: 08/22/2015 23:53   Dg Chest Portable 1 View  Result Date: 08/21/2015 CLINICAL DATA:  Unwitnessed fall from chair today.  Hip fracture. EXAM: PORTABLE CHEST 1  VIEW COMPARISON:  Two-view chest x-ray 01/02/2013 FINDINGS: The heart size is normal. Lung volumes remain low. Atherosclerotic changes are noted at the aortic arch. Chronic interstitial changes are stable. No acute fractures are present.  There is no pneumothorax. IMPRESSION: 1. No acute cardiopulmonary disease or significant interval change. 2. Stable chronic interstitial coarsening. 3. Atherosclerosis of the thoracic aorta. Electronically Signed   By: San Morelle M.D.   On: 08/21/2015 12:17  Dg C-arm 1-60 Min  Result Date: 08/22/2015 CLINICAL DATA:  ORIF left hip fracture. EXAM: DG C-ARM 61-120 MIN; LEFT FEMUR 2 VIEWS COMPARISON:  Preoperative radiographs. FINDINGS: Four fluoroscopic spot images from the operating room post fixation of intertrochanteric left hip fracture demonstrate intra medullary femoral nail with distal locking and proximal lag screws. The fracture is in improved alignment. Total fluoroscopy time 1 minutes 15 seconds. IMPRESSION: Intraoperative fluoroscopy during intertrochanteric left hip fracture ORIF. Electronically Signed   By: Jeb Levering M.D.   On: 08/22/2015 23:51   Dg Hip Unilat W Or Wo Pelvis 2-3 Views Left  Result Date: 08/21/2015 CLINICAL DATA:  Fall at home.  Left hip pain. EXAM: DG HIP (WITH OR WITHOUT PELVIS) 2-3V LEFT COMPARISON:  None. FINDINGS: There is a left femoral intertrochanteric fracture with mild displacement. Slight varus angulation. No subluxation or dislocation. Mild degenerative changes in the hips bilaterally. IMPRESSION: Left femoral intertrochanteric fracture with mild displacement and varus angulation. Electronically Signed   By: Rolm Baptise M.D.   On: 08/21/2015 11:29  Dg Femur Min 2 Views Left  Result Date: 08/22/2015 CLINICAL DATA:  ORIF left hip fracture. EXAM: DG C-ARM 61-120 MIN; LEFT FEMUR 2 VIEWS COMPARISON:  Preoperative radiographs. FINDINGS: Four fluoroscopic spot images from the operating room post fixation of  intertrochanteric left hip fracture demonstrate intra medullary femoral nail with distal locking and proximal lag screws. The fracture is in improved alignment. Total fluoroscopy time 1 minutes 15 seconds. IMPRESSION: Intraoperative fluoroscopy during intertrochanteric left hip fracture ORIF. Electronically Signed  By: Jeb Levering M.D.   On: 08/22/2015 23:51   Dg Femur Min 2 Views Left  Result Date: 08/21/2015 CLINICAL DATA:  Unwitnessed fall. Patient complains of left hip pain. EXAM: LEFT FEMUR 2 VIEWS COMPARISON:  Hip radiograph 08/21/2015 FINDINGS: Re- demonstrated is left femoral intertrochanteric oblique fracture with mild displacement and varus angulation of the distal fracture fragment. There is generalized osteopenia. No distal femoral fractures are seen. IMPRESSION: Known left femoral intertrochanteric fracture with mild displacement and varus angulation. Osteopenia. Electronically Signed   By: Fidela Salisbury M.D.   On: 08/21/2015 13:11  Dg Femur Port Min 2 Views Left  Result Date: 08/22/2015 CLINICAL DATA:  Status post IM nail left femur. EXAM: LEFT FEMUR PORTABLE 2 VIEWS COMPARISON:  Preoperative radiographs yesterday. FINDINGS: Intra medullary nail with distal locking and proximal lag screw traverses intertrochanteric left hip fracture. Fracture is in improved alignment. Recent postsurgical change includes air in the subcutaneous soft tissues. No immediate complication is seen. IMPRESSION: ORIF of intertrochanteric left hip fracture in improved alignment. Electronically Signed   By: Jeb Levering M.D.   On: 08/22/2015 23:49   Ct Maxillofacial Wo Contrast  Result Date: 08/21/2015 CLINICAL DATA:  Tripped and fell and chair today. Left supraorbital hematoma. Personal history of head trauma. No loss of consciousness. The patient was on the floor for 30 minutes prior to receiving help. EXAM: CT HEAD WITHOUT CONTRAST CT MAXILLOFACIAL WITHOUT CONTRAST CT CERVICAL SPINE WITHOUT CONTRAST  TECHNIQUE: Multidetector CT imaging of the head, cervical spine, and maxillofacial structures were performed using the standard protocol without intravenous contrast. Multiplanar CT image reconstructions of the cervical spine and maxillofacial structures were also generated. COMPARISON:  CT head and cervical spine 01/02/2013. FINDINGS: CT HEAD FINDINGS Moderate generalized atrophy and diffuse white matter disease is present. No acute infarct, hemorrhage, or mass lesion is present. The ventricles are proportionate to the degree of atrophy. No significant extra-axial fluid collection is present. Extensive atherosclerotic calcifications are present throughout the cavernous internal carotid arteries vertebrobasilar system. The paranasal sinuses and mastoid air cells are clear. A left supraorbital scalp hematoma measures 2.7 x 1.6 x 1.7 cm without an underlying fracture. CT MAXILLOFACIAL FINDINGS The left supraorbital scalp hematoma is again seen. There is no underlying fracture. The paranasal sinuses and mastoid air cells are clear. Bilateral lens replacements are present. The globes and orbits are otherwise intact. There is no significant postseptal inflammation on the left. Minimal infraorbital edema is present on the left. The mandible is intact and located. Torus mandibularis are noted bilaterally. Soft tissues of the face and muscles of mastication are otherwise within normal limits. Salivary glands are unremarkable. CT CERVICAL SPINE FINDINGS The cervical spine is imaged from the skull base through T2-3. Chronic loss of disc height is present at each level from C4-5 through C7-T1. There slight degenerative anterolisthesis at C4-5. Chronic endplate changes and uncovertebral spurring is present at these levels in particular. No acute fracture traumatic subluxation is present. Osseous foraminal narrowing is most pronounced at C5-6 and C6-7 bilaterally. There is heterogeneous enlargement of the thyroid bilaterally. No  dominant nodule is present. The lung apices are clear. IMPRESSION: 1. Moderate atrophy and white matter disease without acute intracranial abnormality. 2. 2.7 x 1.6 x 1.7 cm left supraorbital scalp hematoma without underlying fracture or globe injury. 3. Minimal infraorbital soft tissue swelling without other significant facial trauma. 4. Moderate multilevel spondylosis of the cervical spine without evidence for acute fracture or traumatic subluxation. Electronically Signed   By:  San Morelle M.D.   On: 08/21/2015 11:58   Assessment/Plan There are no diagnoses linked to this encounter.Stage II pressure ulcer of left heel Left heal ruptured blister wound, small intact skin remained medial aspect of the wound, superficial stage II pressure ulcer, no s/s of infection, pressure reduction, foam dressing     Hallucination, visual Reported 09/06/15, continue to observe behaviors, update CBC, CMP, UA C./S. Hold Tramadol to eliminate possible etiology of hallucination  Loss of weight #10 Ibs since amission after the left hip surgery, poor oral intake, update CBC, CMP, UA C./S   HTN (hypertension) controlled, continue Lisinopril 40mg , Metoprolol succinate 100mg , 08/30/15 Na 141, K 3.8, Bun 22, creat 0.56     Constipation Stable, contineu Fiber Laxative daily, Senna S I bid.     Dementia Gradual decline, continue Namenda and Aricept, SNF for care needs.     Anxiety state Stable.   Acute blood loss anemia S/p 1 unit RRBC transfusion, update CBC, may consider dc ASA if Hgb drops upon dc summary 08/30/15 Hgb 9.6, hematuria resolved after ABT started for UTI, repeated Hgb 9.5 08/30/15 Resumed ASA 09/01/15 Hgb 9.5 Update CBC       Family/ staff Communication: continue SNF, Rehab  Labs/tests ordered:  CBC, CMP, UA C/S

## 2015-09-08 NOTE — Assessment & Plan Note (Signed)
S/p 1 unit RRBC transfusion, update CBC, may consider dc ASA if Hgb drops upon dc summary 08/30/15 Hgb 9.6, hematuria resolved after ABT started for UTI, repeated Hgb 9.5 08/30/15 Resumed ASA 09/01/15 Hgb 9.5 Update CBC

## 2015-09-08 NOTE — Assessment & Plan Note (Signed)
Stable, contineu Fiber Laxative daily, Senna S I bid.

## 2015-09-08 NOTE — Assessment & Plan Note (Signed)
Gradual decline, continue Namenda and Aricept, SNF for care needs.

## 2015-09-08 NOTE — Assessment & Plan Note (Signed)
Left heal ruptured blister wound, small intact skin remained medial aspect of the wound, superficial stage II pressure ulcer, no s/s of infection, pressure reduction, foam dressing

## 2015-09-08 NOTE — Assessment & Plan Note (Signed)
controlled, continue Lisinopril 40mg , Metoprolol succinate 100mg , 08/30/15 Na 141, K 3.8, Bun 22, creat 0.56

## 2015-09-08 NOTE — Assessment & Plan Note (Addendum)
Reported 09/06/15, continue to observe behaviors, update CBC, CMP, UA C./S. Hold Tramadol to eliminate possible etiology of hallucination

## 2015-09-08 NOTE — Assessment & Plan Note (Signed)
Stable

## 2015-09-09 ENCOUNTER — Non-Acute Institutional Stay (SKILLED_NURSING_FACILITY): Payer: Medicare Other | Admitting: Internal Medicine

## 2015-09-09 ENCOUNTER — Encounter: Payer: Self-pay | Admitting: Internal Medicine

## 2015-09-09 DIAGNOSIS — I10 Essential (primary) hypertension: Secondary | ICD-10-CM | POA: Diagnosis not present

## 2015-09-09 DIAGNOSIS — E1140 Type 2 diabetes mellitus with diabetic neuropathy, unspecified: Secondary | ICD-10-CM | POA: Diagnosis not present

## 2015-09-09 DIAGNOSIS — S0083XS Contusion of other part of head, sequela: Secondary | ICD-10-CM

## 2015-09-09 DIAGNOSIS — W19XXXA Unspecified fall, initial encounter: Secondary | ICD-10-CM | POA: Diagnosis not present

## 2015-09-09 DIAGNOSIS — L89622 Pressure ulcer of left heel, stage 2: Secondary | ICD-10-CM | POA: Diagnosis not present

## 2015-09-09 DIAGNOSIS — D62 Acute posthemorrhagic anemia: Secondary | ICD-10-CM | POA: Diagnosis not present

## 2015-09-09 DIAGNOSIS — S72002D Fracture of unspecified part of neck of left femur, subsequent encounter for closed fracture with routine healing: Secondary | ICD-10-CM | POA: Diagnosis not present

## 2015-09-09 DIAGNOSIS — Y92129 Unspecified place in nursing home as the place of occurrence of the external cause: Secondary | ICD-10-CM

## 2015-09-09 LAB — BASIC METABOLIC PANEL
BUN: 26 mg/dL — AB (ref 4–21)
CREATININE: 0.9 mg/dL (ref <=1.1)
Glucose: 106 mg/dL
POTASSIUM: 4.4 mmol/L (ref 3.4–5.3)
Sodium: 137 mmol/L (ref 137–147)

## 2015-09-09 LAB — HEPATIC FUNCTION PANEL
ALT: 14 U/L (ref 7–35)
AST: 14 U/L (ref 13–35)
Alkaline Phosphatase: 75 U/L (ref 25–125)
Bilirubin, Total: 0.6 mg/dL

## 2015-09-09 LAB — CBC AND DIFFERENTIAL
HCT: 31 % — AB (ref 36–46)
Hemoglobin: 10.1 g/dL — AB (ref 12.0–16.0)
PLATELETS: 323 10*3/uL (ref 150–399)
WBC: 6.8 10*3/mL

## 2015-09-09 MED ORDER — FERROUS SULFATE 325 (65 FE) MG PO TABS: ORAL_TABLET | ORAL | 3 refills | Status: AC

## 2015-09-09 NOTE — Progress Notes (Signed)
History and Physical  Provider:  Dr. Jeanmarie Hubert Location:   [facility] Room Number: N27 Place of Service:  SNF (31)  PCP: Jeanmarie Hubert, MD Patient Care Team: Estill Dooms, MD as PCP - General (Internal Medicine) Man Otho Darner, NP as Nurse Practitioner (Internal Medicine)  Extended Emergency Contact Information Primary Emergency Contact: Teresa Richards Address: [address]          [place], [place] [address] Teresa Richards of [place] Phone: [phone] Mobile Phone: [phone] Relation: Daughter Secondary Emergency [place]: [place] [address] [place] Phone: [phone] Relation: Grandson  Code Status: DNR Goals of Care: Advanced Directive information Advanced Directives 09/09/2015  Does patient have an advance directive? Yes  Type of Paramedic of [place];Living will;Out of facility DNR (pink MOST or yellow form)  Does patient want to make changes to advanced directive? -  Copy of advanced directive(s) in chart? Yes  Pre-existing out of facility DNR order (yellow form or pink MOST form) -      Chief Complaint  Patient presents with  . Readmit To SNF    following hospitalization 08/21/15 to 08/24/15. Patient had fallen had left hip fracture ORID by Dr. Lyla Glassing.     HPI: Patient is a 82 y.o. female seen today for readmission to [facility] on 08/24/15 following hospitalization from 08/21/2015 through 08/24/2015. Patient had a simple fall at her nursing home and sustained a closed left hip fracture.   Care in the hospital was provided by the hospitalist service as well as Dr. Dot Lanes, lab work suggested that she had an abnormal baseline EKG and elevations of troponin. These were suggested that she had NSTEMI.  Following cardiology evaluation ,she underwent surgery to fracture the femur with subtrochanteric extension. She had a nail and lag screw placed and interlocking  screw was also placed. She did well postoperatively. She had some acute blood loss anemia. She was returned to the nursing home for rehabilitation and supportive care.  Additional medical issues included her dementia, chronic atrial fibrillation , hypertension, coronary artery disease, constipation, type 2 diabetes with neuropathy, and facial contusion.  Patient had transfusion of 1 unit of packed red blood cells while hospitalized  Patient was admitted for support and rehabilitation.  Physical therapy was working with the patient early on, but treatments were interrupted by over sedated and confused state. Over the last week, her tramadol was discontinued and the confusion appears to have cleared up. Although physical therapy and oxygen therapy facial therapy was not able to work with her effectively because of the sedation and confusion, it appears now that she would benefit from continued therapy.  On readmission is noted that she had a blister of the left heel. This has ruptured. The areas being protected by bandages and foam dressing.  Past Medical History:  Diagnosis Date  . Atrial fibrillation ([place])   . Bradycardia   . Dementia   . Depression   . Diabetes mellitus without complication ([place])   . Fall at nursing home   . Glaucoma   . HOH (hard of hearing)   . Hypertension   . Lower back pain   . Melanoma ([place]) 1960   left shoulder skin  . Muscle pain   . Peripheral neuropathy ([place])   . TBI (traumatic brain injury) ([place])   . Umbilical hernia   . Wears glasses    Past  Surgical History:  Procedure Laterality Date  . FRACTURE SURGERY Left 08/22/2015   hip  . INTRAMEDULLARY (IM) NAIL INTERTROCHANTERIC Left 08/22/2015   Procedure: INTRAMEDULLARY (IM) NAIL INTERTROCHANTRIC;  Surgeon: Rod Can, MD;  Location: [facility];  Service: Orthopedics;  Laterality: Left;  . SKIN CANCER EXCISION Left 1960's   melanoma shoulder    reports that she has never smoked. She has never used  smokeless tobacco. She reports that she does not drink alcohol or use drugs. Social History   Social History  . Marital status: Married    Spouse name: N/A  . Number of children: 1  . Years of education: 49   Occupational History  . retired Network engineer    Social History Main Topics  . Smoking status: Never Smoker  . Smokeless tobacco: Never Used  . Alcohol use No     Comment: Patient drinks coffee occasional.  . Drug use: No  . Sexual activity: No   Other Topics Concern  . Not on file   Social History Narrative   Lives at [facility], moved to [place] 11/16/14   Widowed    Has MOST form signed   DNR, POA, Living Will   Walks with walker   Exercise: once a day does legs and arms    Never smoked   Alcohol none     Functional Status Survey:    Family History  Problem Relation Age of Onset  . Leukemia Father   . Heart disease Brother   . Breast cancer Sister     Health Maintenance  Topic Date Due  . OPHTHALMOLOGY EXAM  09/24/1930  . ZOSTAVAX  09/23/1980  . DEXA SCAN  09/23/1985  . PNA vac Low Risk Adult (2 of 2 - PCV13) 11/22/2000  . TETANUS/TDAP  10/23/2010  . INFLUENZA VACCINE  08/23/2015  . FOOT EXAM  10/26/2015  . HEMOGLOBIN A1C  02/29/2016    Allergies  Allergen Reactions  . Codeine Other (See Comments)    Per [facility]      Medication List       Accurate as of 09/09/15  2:26 PM. Always use your most recent med list.          acetaminophen 325 MG tablet Commonly known as:  TYLENOL Take 650 mg by mouth every 4 (four) hours as needed for moderate pain.   aspirin 81 MG EC tablet Take 1 tablet (81 mg total) by mouth 2 (two) times daily with a meal.   CERTAVITE/ANTIOXIDANTS PO Take 1 tablet by mouth daily.   donepezil 10 MG tablet Commonly known as:  ARICEPT Take 10 mg by mouth at bedtime.   feeding supplement Liqd Take 1 Container by mouth 2 (two) times daily between meals. Increase to 120 ml three time daily.   FIBER-CAPS PO Take 1 capsule by  mouth daily.   ipratropium-albuterol 0.5-2.5 (3) MG/3ML Soln Commonly known as:  DUONEB Take 3 mLs by nebulization 2 (two) times daily as needed (for Shortness of breath).   lisinopril 40 MG tablet Commonly known as:  PRINIVIL,ZESTRIL Take 40 mg by mouth daily. For HTN   memantine 10 MG tablet Commonly known as:  NAMENDA Take 10 mg by mouth 2 (two) times daily.   metoprolol succinate 100 MG 24 hr tablet Commonly known as:  TOPROL-XL Take 100 mg by mouth daily. Take with or immediately following a meal.   PROSTATE PO Take by mouth. Take 30 ml twice daily to promote wound healing may mix with 4-8 oz  fluid   saccharomyces boulardii 250 MG capsule Commonly known as:  FLORASTOR Take 250 mg by mouth. Take one twice daily for 7 days   sennosides-docusate sodium 8.6-50 MG tablet Commonly known as:  SENOKOT-S Take 1 tablet by mouth daily. Hold for loose stool   sulfamethoxazole-trimethoprim 400-80 MG tablet Commonly known as:  BACTRIM,SEPTRA Take 1 tablet by mouth. Take one twice daily for 7 days UTI   traMADol 50 MG tablet Commonly known as:  ULTRAM Take by mouth every 6 (six) hours as needed.       Review of Systems  Constitutional: Positive for activity change, appetite change and fatigue.  HENT: Negative.   Eyes: Positive for visual disturbance (corrective lenses).  Respiratory: Negative.   Cardiovascular: Negative for chest pain, palpitations and leg swelling.  Gastrointestinal: Negative.   Endocrine: Positive for polyuria.       Diabetic with neuropathy  Genitourinary: Positive for frequency. Negative for dysuria and flank pain.       Episodes of incontinence.   Musculoskeletal: Positive for gait problem.  Skin:       Left facial and jaw ecchymoses. Left hip surgical incisions healed,  Left heel ruptured blister wound.   Allergic/Immunologic: Negative.   Neurological: Positive for light-headedness. Negative for tremors, seizures, syncope, facial asymmetry, speech  difficulty, weakness, numbness and headaches.       Dementia  Hematological: Bruises/bleeds easily.  Psychiatric/Behavioral: Positive for confusion, hallucinations and sleep disturbance.    Vitals:   09/09/15 1414  BP: 132/69  Pulse: 71  Resp: 18  Temp: 98.1 F (36.7 C)  SpO2: 93%  Weight: 132 lb (59.9 kg)  Height: 4\' 11"  (1.499 m)   Body mass index is 26.66 kg/m. Physical Exam  Constitutional: She appears well-developed and well-nourished.  HENT:  Head: Normocephalic and atraumatic.  Right Ear: External ear normal.  Left Ear: External ear normal.  Eyes: Conjunctivae and EOM are normal. Pupils are equal, round, and reactive to light.  Neck: Neck supple. No JVD present. No tracheal deviation present. No thyromegaly present.  Cardiovascular: Normal rate.   Murmur heard.  Systolic murmur is present with a grade of 2/6  Pulmonary/Chest: Effort normal and breath sounds normal. No respiratory distress. She has no rales.  Abdominal: Soft. Bowel sounds are normal. She exhibits no mass. There is no tenderness.  Musculoskeletal: She exhibits no edema or tenderness.  Recent fracture of the left hip. Unstable gait. Using rolling walker. Mild scoliosis of thoracic and lumbar spine.  Lymphadenopathy:    She has no cervical adenopathy.  Neurological: She is alert. She has normal reflexes. No cranial nerve deficit. She exhibits normal muscle tone. Coordination normal.  Demented  Skin: Skin is warm and dry. No rash noted. No erythema.  Left facial ecchymoses. Left hip surgical incisions healed,  Left heel ruptured blister wound, small intact skin remained medial aspect of the wound, superficial stage II pressure ulcer, no s/s of infection, pressure reduction, foam dressing    Psychiatric: She has a normal mood and affect. Her behavior is normal. Her speech is not delayed and not slurred. She is not agitated, not aggressive, not hyperactive, not slowed, not withdrawn, not actively  hallucinating and not combative. Thought content is not paranoid and not delusional. Cognition and memory are impaired. She exhibits abnormal recent memory.    Labs reviewed: Basic Metabolic Panel:  Recent Labs  08/22/15 0429 08/22/15 1020 08/23/15 0514 08/23/15 0730 08/24/15 0543 08/29/15  NA 137  --  137  --  137 141  K 4.3  --  3.0*  --  3.7 3.8  CL 107  --  106  --  107  --   CO2 23  --  21*  --  23  --   GLUCOSE 105*  --  157*  --  145*  --   BUN 13  --  11  --  19 22*  CREATININE 0.66  --  0.86  --  0.76 0.6  CALCIUM 8.5*  --  8.2*  --  8.4*  --   MG  --  1.6*  --  1.8  --   --    Liver Function Tests:  Recent Labs  08/21/15 1209 08/22/15 0429 08/29/15  AST 20 15 17   ALT 17 13* 13  ALKPHOS 66 51 50  BILITOT 0.7 1.2  --   PROT 7.2 5.9*  --   ALBUMIN 4.3 3.6  --    No results for input(s): LIPASE, AMYLASE in the last 8760 hours. No results for input(s): AMMONIA in the last 8760 hours. CBC:  Recent Labs  08/21/15 1209 08/22/15 0429 08/23/15 0514 08/24/15 0543 08/24/15 1433 08/29/15 08/31/15  WBC 12.0* 8.0 13.5* 8.9 10.8* 7.9 7.5  NEUTROABS 10.6* 5.4  --   --   --   --   --   HGB 13.9 11.5* 9.0* 7.9* 9.5* 9.6* 9.5*  HCT 43.0 34.6* 28.0* 24.3* 29.4* 29* 29*  MCV 84.5 83.6 84.6 83.2 85.0  --   --   PLT 177 160 131* 122* 119* 223 276   Cardiac Enzymes:  Recent Labs  08/21/15 2257 08/22/15 0429 08/22/15 1020  TROPONINI 0.16* 0.26* 0.29*   BNP: Invalid input(s): POCBNP Lab Results  Component Value Date   HGBA1C 5.7 08/29/2015   Lab Results  Component Value Date   TSH 0.88 05/05/2015   Lab Results  Component Value Date   [id number] 05/19/2012   No results found for: FOLATE No results found for: IRON, TIBC, FERRITIN  Imaging and Procedures obtained prior to SNF admission: Ct Head Wo Contrast  Result Date: 08/21/2015 CLINICAL DATA:  Tripped and fell and chair today. Left supraorbital hematoma. Personal history of head trauma. No loss of  consciousness. The patient was on the floor for 30 minutes prior to receiving help. EXAM: CT HEAD WITHOUT CONTRAST CT MAXILLOFACIAL WITHOUT CONTRAST CT CERVICAL SPINE WITHOUT CONTRAST TECHNIQUE: Multidetector CT imaging of the head, cervical spine, and maxillofacial structures were performed using the standard protocol without intravenous contrast. Multiplanar CT image reconstructions of the cervical spine and maxillofacial structures were also generated. COMPARISON:  CT head and cervical spine 01/02/2013. FINDINGS: CT HEAD FINDINGS Moderate generalized atrophy and diffuse white matter disease is present. No acute infarct, hemorrhage, or mass lesion is present. The ventricles are proportionate to the degree of atrophy. No significant extra-axial fluid collection is present. Extensive atherosclerotic calcifications are present throughout the cavernous internal carotid arteries vertebrobasilar system. The paranasal sinuses and mastoid air cells are clear. A left supraorbital scalp hematoma measures 2.7 x 1.6 x 1.7 cm without an underlying fracture. CT MAXILLOFACIAL FINDINGS The left supraorbital scalp hematoma is again seen. There is no underlying fracture. The paranasal sinuses and mastoid air cells are clear. Bilateral lens replacements are present. The globes and orbits are otherwise intact. There is no significant postseptal inflammation on the left. Minimal infraorbital edema is present on the left. The mandible is intact and located. Torus mandibularis are noted bilaterally. Soft tissues of the face  and muscles of mastication are otherwise within normal limits. Salivary glands are unremarkable. CT CERVICAL SPINE FINDINGS The cervical spine is imaged from the skull base through T2-3. Chronic loss of disc height is present at each level from C4-5 through C7-T1. There slight degenerative anterolisthesis at C4-5. Chronic endplate changes and uncovertebral spurring is present at these levels in particular. No acute  fracture traumatic subluxation is present. Osseous foraminal narrowing is most pronounced at C5-6 and C6-7 bilaterally. There is heterogeneous enlargement of the thyroid bilaterally. No dominant nodule is present. The lung apices are clear. IMPRESSION: 1. Moderate atrophy and white matter disease without acute intracranial abnormality. 2. 2.7 x 1.6 x 1.7 cm left supraorbital scalp hematoma without underlying fracture or globe injury. 3. Minimal infraorbital soft tissue swelling without other significant facial trauma. 4. Moderate multilevel spondylosis of the cervical spine without evidence for acute fracture or traumatic subluxation. Electronically Signed   By: San Morelle M.D.   On: 08/21/2015 11:58  Ct Cervical Spine Wo Contrast  Result Date: 08/21/2015 CLINICAL DATA:  Tripped and fell and chair today. Left supraorbital hematoma. Personal history of head trauma. No loss of consciousness. The patient was on the floor for 30 minutes prior to receiving help. EXAM: CT HEAD WITHOUT CONTRAST CT MAXILLOFACIAL WITHOUT CONTRAST CT CERVICAL SPINE WITHOUT CONTRAST TECHNIQUE: Multidetector CT imaging of the head, cervical spine, and maxillofacial structures were performed using the standard protocol without intravenous contrast. Multiplanar CT image reconstructions of the cervical spine and maxillofacial structures were also generated. COMPARISON:  CT head and cervical spine 01/02/2013. FINDINGS: CT HEAD FINDINGS Moderate generalized atrophy and diffuse white matter disease is present. No acute infarct, hemorrhage, or mass lesion is present. The ventricles are proportionate to the degree of atrophy. No significant extra-axial fluid collection is present. Extensive atherosclerotic calcifications are present throughout the cavernous internal carotid arteries vertebrobasilar system. The paranasal sinuses and mastoid air cells are clear. A left supraorbital scalp hematoma measures 2.7 x 1.6 x 1.7 cm without an  underlying fracture. CT MAXILLOFACIAL FINDINGS The left supraorbital scalp hematoma is again seen. There is no underlying fracture. The paranasal sinuses and mastoid air cells are clear. Bilateral lens replacements are present. The globes and orbits are otherwise intact. There is no significant postseptal inflammation on the left. Minimal infraorbital edema is present on the left. The mandible is intact and located. Torus mandibularis are noted bilaterally. Soft tissues of the face and muscles of mastication are otherwise within normal limits. Salivary glands are unremarkable. CT CERVICAL SPINE FINDINGS The cervical spine is imaged from the skull base through T2-3. Chronic loss of disc height is present at each level from C4-5 through C7-T1. There slight degenerative anterolisthesis at C4-5. Chronic endplate changes and uncovertebral spurring is present at these levels in particular. No acute fracture traumatic subluxation is present. Osseous foraminal narrowing is most pronounced at C5-6 and C6-7 bilaterally. There is heterogeneous enlargement of the thyroid bilaterally. No dominant nodule is present. The lung apices are clear. IMPRESSION: 1. Moderate atrophy and white matter disease without acute intracranial abnormality. 2. 2.7 x 1.6 x 1.7 cm left supraorbital scalp hematoma without underlying fracture or globe injury. 3. Minimal infraorbital soft tissue swelling without other significant facial trauma. 4. Moderate multilevel spondylosis of the cervical spine without evidence for acute fracture or traumatic subluxation. Electronically Signed   By: San Morelle M.D.   On: 08/21/2015 11:58  Pelvis Portable  Result Date: 08/22/2015 CLINICAL DATA:  Post ORIF left hip fracture. EXAM:  PORTABLE PELVIS 1-2 VIEWS COMPARISON:  Preoperative radiographs yesterday FINDINGS: Interval intra medullary nail and like screw fixation of comminuted intertrochanteric left hip fracture. Alignment is improved from  preoperative exam. No apparent immediate postoperative complication. No additional acute fracture of the bony pelvis. IMPRESSION: Post ORIF intertrochanteric left hip fracture. No immediate postoperative complication. Electronically Signed   By: Jeb Levering M.D.   On: 08/22/2015 23:53   Dg Chest Portable 1 View  Result Date: 08/21/2015 CLINICAL DATA:  Unwitnessed fall from chair today.  Hip fracture. EXAM: PORTABLE CHEST 1 VIEW COMPARISON:  Two-view chest x-ray 01/02/2013 FINDINGS: The heart size is normal. Lung volumes remain low. Atherosclerotic changes are noted at the aortic arch. Chronic interstitial changes are stable. No acute fractures are present.  There is no pneumothorax. IMPRESSION: 1. No acute cardiopulmonary disease or significant interval change. 2. Stable chronic interstitial coarsening. 3. Atherosclerosis of the thoracic aorta. Electronically Signed   By: San Morelle M.D.   On: 08/21/2015 12:17  Dg C-arm 1-60 Min  Result Date: 08/22/2015 CLINICAL DATA:  ORIF left hip fracture. EXAM: DG C-ARM 61-120 MIN; LEFT FEMUR 2 VIEWS COMPARISON:  Preoperative radiographs. FINDINGS: Four fluoroscopic spot images from the operating room post fixation of intertrochanteric left hip fracture demonstrate intra medullary femoral nail with distal locking and proximal lag screws. The fracture is in improved alignment. Total fluoroscopy time 1 minutes 15 seconds. IMPRESSION: Intraoperative fluoroscopy during intertrochanteric left hip fracture ORIF. Electronically Signed   By: Jeb Levering M.D.   On: 08/22/2015 23:51   Dg Hip Unilat W Or Wo Pelvis 2-3 Views Left  Result Date: 08/21/2015 CLINICAL DATA:  Fall at home.  Left hip pain. EXAM: DG HIP (WITH OR WITHOUT PELVIS) 2-3V LEFT COMPARISON:  None. FINDINGS: There is a left femoral intertrochanteric fracture with mild displacement. Slight varus angulation. No subluxation or dislocation. Mild degenerative changes in the hips bilaterally.  IMPRESSION: Left femoral intertrochanteric fracture with mild displacement and varus angulation. Electronically Signed   By: Rolm Baptise M.D.   On: 08/21/2015 11:29  Dg Femur Min 2 Views Left  Result Date: 08/22/2015 CLINICAL DATA:  ORIF left hip fracture. EXAM: DG C-ARM 61-120 MIN; LEFT FEMUR 2 VIEWS COMPARISON:  Preoperative radiographs. FINDINGS: Four fluoroscopic spot images from the operating room post fixation of intertrochanteric left hip fracture demonstrate intra medullary femoral nail with distal locking and proximal lag screws. The fracture is in improved alignment. Total fluoroscopy time 1 minutes 15 seconds. IMPRESSION: Intraoperative fluoroscopy during intertrochanteric left hip fracture ORIF. Electronically Signed   By: Jeb Levering M.D.   On: 08/22/2015 23:51   Dg Femur Min 2 Views Left  Result Date: 08/21/2015 CLINICAL DATA:  Unwitnessed fall. Patient complains of left hip pain. EXAM: LEFT FEMUR 2 VIEWS COMPARISON:  Hip radiograph 08/21/2015 FINDINGS: Re- demonstrated is left femoral intertrochanteric oblique fracture with mild displacement and varus angulation of the distal fracture fragment. There is generalized osteopenia. No distal femoral fractures are seen. IMPRESSION: Known left femoral intertrochanteric fracture with mild displacement and varus angulation. Osteopenia. Electronically Signed   By: Fidela Salisbury M.D.   On: 08/21/2015 13:11  Dg Femur Port Min 2 Views Left  Result Date: 08/22/2015 CLINICAL DATA:  Status post IM nail left femur. EXAM: LEFT FEMUR PORTABLE 2 VIEWS COMPARISON:  Preoperative radiographs yesterday. FINDINGS: Intra medullary nail with distal locking and proximal lag screw traverses intertrochanteric left hip fracture. Fracture is in improved alignment. Recent postsurgical change includes air in the subcutaneous soft tissues.  No immediate complication is seen. IMPRESSION: ORIF of intertrochanteric left hip fracture in improved alignment.  Electronically Signed   By: Jeb Levering M.D.   On: 08/22/2015 23:49   Ct Maxillofacial Wo Contrast  Result Date: 08/21/2015 CLINICAL DATA:  Tripped and fell and chair today. Left supraorbital hematoma. Personal history of head trauma. No loss of consciousness. The patient was on the floor for 30 minutes prior to receiving help. EXAM: CT HEAD WITHOUT CONTRAST CT MAXILLOFACIAL WITHOUT CONTRAST CT CERVICAL SPINE WITHOUT CONTRAST TECHNIQUE: Multidetector CT imaging of the head, cervical spine, and maxillofacial structures were performed using the standard protocol without intravenous contrast. Multiplanar CT image reconstructions of the cervical spine and maxillofacial structures were also generated. COMPARISON:  CT head and cervical spine 01/02/2013. FINDINGS: CT HEAD FINDINGS Moderate generalized atrophy and diffuse white matter disease is present. No acute infarct, hemorrhage, or mass lesion is present. The ventricles are proportionate to the degree of atrophy. No significant extra-axial fluid collection is present. Extensive atherosclerotic calcifications are present throughout the cavernous internal carotid arteries vertebrobasilar system. The paranasal sinuses and mastoid air cells are clear. A left supraorbital scalp hematoma measures 2.7 x 1.6 x 1.7 cm without an underlying fracture. CT MAXILLOFACIAL FINDINGS The left supraorbital scalp hematoma is again seen. There is no underlying fracture. The paranasal sinuses and mastoid air cells are clear. Bilateral lens replacements are present. The globes and orbits are otherwise intact. There is no significant postseptal inflammation on the left. Minimal infraorbital edema is present on the left. The mandible is intact and located. Torus mandibularis are noted bilaterally. Soft tissues of the face and muscles of mastication are otherwise within normal limits. Salivary glands are unremarkable. CT CERVICAL SPINE FINDINGS The cervical spine is imaged from the  skull base through T2-3. Chronic loss of disc height is present at each level from C4-5 through C7-T1. There slight degenerative anterolisthesis at C4-5. Chronic endplate changes and uncovertebral spurring is present at these levels in particular. No acute fracture traumatic subluxation is present. Osseous foraminal narrowing is most pronounced at C5-6 and C6-7 bilaterally. There is heterogeneous enlargement of the thyroid bilaterally. No dominant nodule is present. The lung apices are clear. IMPRESSION: 1. Moderate atrophy and white matter disease without acute intracranial abnormality. 2. 2.7 x 1.6 x 1.7 cm left supraorbital scalp hematoma without underlying fracture or globe injury. 3. Minimal infraorbital soft tissue swelling without other significant facial trauma. 4. Moderate multilevel spondylosis of the cervical spine without evidence for acute fracture or traumatic subluxation. Electronically Signed   By: San Morelle M.D.   On: 08/21/2015 11:58   Assessment/Plan  1. Closed left hip fracture, with routine healing, subsequent encounter Patient would benefit at this point from continuation of her physical therapy and occupational therapy. Although confusion persists related to her dementia, the previous problems of oversedation and inability to participate have cleared with discontinuation of tramadol.  2. Essential hypertension controlled   3. Type 2 diabetes, controlled, with neuropathy ([place]) Controlled   4. Acute blood loss anemia -CBC next week  - ferrous sulfate 325 (65 FE) MG tablet; Take one tablet twice daily to correct anemia  Dispense: 60 tablet; Refill: 3  5. Fall at nursing home, initial encounter Remains at high risk for further falls   6. Facial contusion, sequela Ecchymoses are clearing   7. Stage II pressure ulcer of left heel continue foam bandaging for protection of the left heel and surrounding areas at the ankles

## 2015-09-12 LAB — CBC AND DIFFERENTIAL
HEMATOCRIT: 33 % — AB (ref 36–46)
HEMOGLOBIN: 10.8 g/dL — AB (ref 12.0–16.0)
PLATELETS: 322 10*3/uL (ref 150–399)
WBC: 5.1 10*3/mL

## 2015-09-13 ENCOUNTER — Encounter: Payer: Self-pay | Admitting: Nurse Practitioner

## 2015-09-13 ENCOUNTER — Non-Acute Institutional Stay (SKILLED_NURSING_FACILITY): Payer: Medicare Other | Admitting: Nurse Practitioner

## 2015-09-13 DIAGNOSIS — D62 Acute posthemorrhagic anemia: Secondary | ICD-10-CM | POA: Diagnosis not present

## 2015-09-13 DIAGNOSIS — K5900 Constipation, unspecified: Secondary | ICD-10-CM | POA: Diagnosis not present

## 2015-09-13 DIAGNOSIS — L89622 Pressure ulcer of left heel, stage 2: Secondary | ICD-10-CM

## 2015-09-13 DIAGNOSIS — E1140 Type 2 diabetes mellitus with diabetic neuropathy, unspecified: Secondary | ICD-10-CM

## 2015-09-13 DIAGNOSIS — N390 Urinary tract infection, site not specified: Secondary | ICD-10-CM | POA: Diagnosis not present

## 2015-09-13 DIAGNOSIS — R319 Hematuria, unspecified: Secondary | ICD-10-CM | POA: Diagnosis not present

## 2015-09-13 DIAGNOSIS — F411 Generalized anxiety disorder: Secondary | ICD-10-CM

## 2015-09-13 DIAGNOSIS — F0390 Unspecified dementia without behavioral disturbance: Secondary | ICD-10-CM

## 2015-09-13 DIAGNOSIS — I10 Essential (primary) hypertension: Secondary | ICD-10-CM

## 2015-09-13 DIAGNOSIS — E876 Hypokalemia: Secondary | ICD-10-CM

## 2015-09-13 NOTE — Assessment & Plan Note (Signed)
09/09/15 Hgb 10.1, Na 137, K 4.4, Bun 26, creat 0.88 09/12/15 Hgb 10.8 Continue Fe

## 2015-09-13 NOTE — Assessment & Plan Note (Signed)
controlled, continue Lisinopril 40mg , Metoprolol succinate 100mg  daily.  09/09/15 Hgb 10.1, Na 137, K 4.4, Bun 26, creat 0.88

## 2015-09-13 NOTE — Assessment & Plan Note (Signed)
Stable, contineu Fiber Laxative daily, Senna S I bid.

## 2015-09-13 NOTE — Assessment & Plan Note (Signed)
09/09/15 Hgb 10.1, Na 137, K 4.4, Bun 26, creat 0.88 09/11/15 urine culture P. Mirabilis 09/12/15 10 day course of Cipro 250mg  bid.  09/12/15 Hgb 10.8

## 2015-09-13 NOTE — Assessment & Plan Note (Signed)
Left heal ruptured blister wound, small intact skin remained medial aspect of the wound, superficial stage II pressure ulcer, no s/s of infection, pressure reduction, foam dressing, healing nicely

## 2015-09-13 NOTE — Progress Notes (Signed)
Location:   [place] Room Number: 70 Place of Service:  SNF (31) Provider:  Londell Noll  NP  Jeanmarie Hubert, MD  Patient Care Team: Estill Dooms, MD as PCP - General (Internal Medicine) Mancil Pfenning Otho Darner, NP as Nurse Practitioner (Internal Medicine)  Extended Emergency Contact Information Primary Emergency Contact: Hanzaker,[name] Address: [address]          [place], [place] [address] Johnnette Litter of [place] Phone: [phone] Mobile Phone: [phone] Relation: Daughter Secondary Emergency [place]: [place] [address] [place] Phone: [phone] Relation: Grandson  Code Status:  DNR Goals of care: Advanced Directive information Advanced Directives 09/13/2015  Does patient have an advance directive? Yes  Type of Advance Directive Out of facility DNR (pink MOST or yellow form);Fults;Living will  Does patient want to make changes to advanced directive? No - Patient declined  Copy of advanced directive(s) in chart? Yes  Pre-existing out of facility DNR order (yellow form or pink MOST form) -     Chief Complaint  Patient presents with  . Acute Visit    UTI    HPI:  Pt is a 82 y.o. female seen today for an acute visit for recurrent UTI, 09/11/15 urine culture P. Mirabilis, >100,000c/ml, Cipro 250mg  bid started 09/12/15, tolerated so far.     Hx of the left heel pressures ulcer, healing nicely. Post Op anemia, Hgb 10.8 08/12/15, s/p PRBC transfusion.    Hx of dementia, taking Aricept and Namenda, resides in SNF for care needs. Blood pressure is controlled on Metoprolol and Lisinopril     Past Medical History:  Diagnosis Date  . Atrial fibrillation ([place])   . Bradycardia   . Dementia   . Depression   . Diabetes mellitus without complication ([place])   . Fall at nursing home   . Glaucoma   . HOH (hard of hearing)   . Hypertension   . Lower back pain   . Melanoma ([place]) 1960   left shoulder skin  . Muscle pain   . Peripheral neuropathy ([place])   . TBI (traumatic brain injury) ([place])   . Umbilical hernia   . Wears glasses    Past Surgical History:  Procedure Laterality Date  . FRACTURE SURGERY Left 08/22/2015   hip  . INTRAMEDULLARY (IM) NAIL INTERTROCHANTERIC Left 08/22/2015   Procedure: INTRAMEDULLARY (IM) NAIL INTERTROCHANTRIC;  Surgeon: Rod Can, MD;  Location: [facility];  Service: Orthopedics;  Laterality: Left;  . SKIN CANCER EXCISION Left 1960's   melanoma shoulder    Allergies  Allergen Reactions  . Codeine Other (See Comments)    Per [facility]      Medication List       Accurate as of 09/13/15 12:03 PM. Always use your most recent med list.          acetaminophen 325 MG tablet Commonly known as:  TYLENOL Take 650 mg by mouth every 4 (four) hours as needed for moderate pain.   aspirin 81 MG EC tablet Take 1 tablet (81 mg total) by mouth 2 (two) times daily with a meal.   CERTAVITE/ANTIOXIDANTS PO Take 1 tablet by mouth daily.   donepezil 10 MG tablet Commonly known as:  ARICEPT Take 10 mg by mouth at bedtime.   feeding supplement Liqd Take 1 Container by mouth 2 (two) times daily between meals. Increase to 120 ml three time daily.   ferrous  sulfate 325 (65 FE) MG tablet Take one tablet twice daily to correct anemia   FIBER-CAPS PO Take 1 capsule by mouth daily.   ipratropium-albuterol 0.5-2.5 (3) MG/3ML Soln Commonly known as:  DUONEB Take 3 mLs by nebulization 2 (two) times daily as needed (for Shortness of breath).   lisinopril 40 MG tablet Commonly known as:  PRINIVIL,ZESTRIL Take 40 mg by mouth daily. For HTN   memantine 10 MG tablet Commonly known as:  NAMENDA Take 10 mg by mouth 2 (two) times daily.   metoprolol succinate 100 MG 24 hr tablet Commonly known as:  TOPROL-XL Take 100 mg by mouth daily. Take with or immediately following a meal.   PROSTATE PO Take by mouth. Take 30 ml twice daily to promote wound  healing may mix with 4-8 oz fluid   sennosides-docusate sodium 8.6-50 MG tablet Commonly known as:  SENOKOT-S Take 1 tablet by mouth daily. Hold for loose stool   traMADol 50 MG tablet Commonly known as:  ULTRAM Take 50 mg by mouth every 6 (six) hours.       Review of Systems  Constitutional: Positive for activity change, appetite change and fatigue.  HENT: Negative.   Eyes: Positive for visual disturbance (corrective lenses).  Respiratory: Negative.   Cardiovascular: Negative for chest pain, palpitations and leg swelling.  Gastrointestinal: Negative.   Endocrine: Positive for polyuria.       Diabetic with neuropathy  Genitourinary: Positive for frequency. Negative for dysuria and flank pain.       Episodes of incontinence.   Musculoskeletal: Positive for gait problem.  Skin:       Left facial and jaw ecchymoses. Left hip surgical incisions healed,  Left heel ruptured blister wound, healing nicely  Allergic/Immunologic: Negative.   Neurological: Positive for light-headedness. Negative for tremors, seizures, syncope, facial asymmetry, speech difficulty, weakness, numbness and headaches.       Dementia  Hematological: Bruises/bleeds easily.  Psychiatric/Behavioral: Positive for confusion, hallucinations and sleep disturbance.    Immunization History  Administered Date(s) Administered  . Influenza-Unspecified 11/10/2012, 10/27/2013, 10/28/2014  . PPD Test 11/17/2011, 11/16/2014, 08/24/2015  . Pneumococcal Polysaccharide-23 11/23/1999  . Td 10/22/2000   Pertinent  Health Maintenance Due  Topic Date Due  . OPHTHALMOLOGY EXAM  09/24/1930  . DEXA SCAN  09/23/1985  . PNA vac Low Risk Adult (2 of 2 - PCV13) 11/22/2000  . INFLUENZA VACCINE  08/23/2015  . FOOT EXAM  10/26/2015  . HEMOGLOBIN A1C  02/29/2016   Fall Risk  09/09/2015 11/29/2014 11/09/2014 10/26/2014 10/27/2013  Falls in the past year? Yes No No No No  Injury with Fall? Yes - - - -  Risk for fall due to : - - - -  Impaired mobility;Impaired balance/gait;Mental status change   Functional Status Survey:    Vitals:   09/13/15 0958  BP: 114/60  Pulse: 70  Resp: 16  Temp: 97.2 F (36.2 C)  SpO2: 96%  Weight: 132 lb (59.9 kg)  Height: 4\' 11"  (1.499 m)   Body mass index is 26.66 kg/m. Physical Exam  Constitutional: She appears well-developed and well-nourished.  HENT:  Head: Normocephalic and atraumatic.  Right Ear: External ear normal.  Left Ear: External ear normal.  Eyes: Conjunctivae and EOM are normal. Pupils are equal, round, and reactive to light.  Neck: Neck supple. No JVD present. No tracheal deviation present. No thyromegaly present.  Cardiovascular: Normal rate.   Murmur heard.  Systolic murmur is present with a grade of 2/6  Pulmonary/Chest:  Effort normal and breath sounds normal. No respiratory distress. She has no rales.  Abdominal: Soft. Bowel sounds are normal. She exhibits no mass. There is no tenderness.  Musculoskeletal: She exhibits no edema or tenderness.  Recent fracture of the left hip. Unstable gait. Using rolling walker. Mild scoliosis of thoracic and lumbar spine.  Lymphadenopathy:    She has no cervical adenopathy.  Neurological: She is alert. She has normal reflexes. No cranial nerve deficit. She exhibits normal muscle tone. Coordination normal.  Demented  Skin: Skin is warm and dry. No rash noted. No erythema.  Left facial ecchymoses. Left hip surgical incisions healed,  Left heel ruptured blister wound, small intact skin remained medial aspect of the wound, superficial stage II pressure ulcer, no s/s of infection, pressure reduction, foam dressing, healing nicely.    Psychiatric: She has a normal mood and affect. Her behavior is normal. Her speech is not delayed and not slurred. She is not agitated, not aggressive, not hyperactive, not slowed, not withdrawn, not actively hallucinating and not combative. Thought content is not paranoid and not delusional. Cognition  and memory are impaired. She exhibits abnormal recent memory.    Labs reviewed:  Recent Labs  08/22/15 0429 08/22/15 1020 08/23/15 0514 08/23/15 0730 08/24/15 0543 08/29/15 09/09/15  NA 137  --  137  --  137 141 137  K 4.3  --  3.0*  --  3.7 3.8 4.4  CL 107  --  106  --  107  --   --   CO2 23  --  21*  --  23  --   --   GLUCOSE 105*  --  157*  --  145*  --   --   BUN 13  --  11  --  19 22* 26*  CREATININE 0.66  --  0.86  --  0.76 0.6 0.9  CALCIUM 8.5*  --  8.2*  --  8.4*  --   --   MG  --  1.6*  --  1.8  --   --   --     Recent Labs  08/21/15 1209 08/22/15 0429 08/29/15 09/09/15  AST 20 15 17 14   ALT 17 13* 13 14  ALKPHOS 66 51 50 75  BILITOT 0.7 1.2  --   --   PROT 7.2 5.9*  --   --   ALBUMIN 4.3 3.6  --   --     Recent Labs  08/21/15 1209 08/22/15 0429 08/23/15 0514 08/24/15 0543 08/24/15 1433  08/31/15 09/09/15 09/12/15  WBC 12.0* 8.0 13.5* 8.9 10.8*  < > 7.5 6.8 5.1  NEUTROABS 10.6* 5.4  --   --   --   --   --   --   --   HGB 13.9 11.5* 9.0* 7.9* 9.5*  < > 9.5* 10.1* 10.8*  HCT 43.0 34.6* 28.0* 24.3* 29.4*  < > 29* 31* 33*  MCV 84.5 83.6 84.6 83.2 85.0  --   --   --   --   PLT 177 160 131* 122* 119*  < > 276 323 322  < > = values in this interval not displayed. Lab Results  Component Value Date   TSH 0.88 05/05/2015   Lab Results  Component Value Date   HGBA1C 5.7 08/29/2015   Lab Results  Component Value Date   CHOL 191 05/20/2012   HDL 38 (L) 05/20/2012   LDLCALC 118 (H) 05/20/2012   TRIG 174 (H) 05/20/2012  CHOLHDL 5.0 05/20/2012    Significant Diagnostic Results in last 30 days:  Ct Head Wo Contrast  Result Date: 08/21/2015 CLINICAL DATA:  Tripped and fell and chair today. Left supraorbital hematoma. Personal history of head trauma. No loss of consciousness. The patient was on the floor for 30 minutes prior to receiving help. EXAM: CT HEAD WITHOUT CONTRAST CT MAXILLOFACIAL WITHOUT CONTRAST CT CERVICAL SPINE WITHOUT CONTRAST TECHNIQUE:  Multidetector CT imaging of the head, cervical spine, and maxillofacial structures were performed using the standard protocol without intravenous contrast. Multiplanar CT image reconstructions of the cervical spine and maxillofacial structures were also generated. COMPARISON:  CT head and cervical spine 01/02/2013. FINDINGS: CT HEAD FINDINGS Moderate generalized atrophy and diffuse white matter disease is present. No acute infarct, hemorrhage, or mass lesion is present. The ventricles are proportionate to the degree of atrophy. No significant extra-axial fluid collection is present. Extensive atherosclerotic calcifications are present throughout the cavernous internal carotid arteries vertebrobasilar system. The paranasal sinuses and mastoid air cells are clear. A left supraorbital scalp hematoma measures 2.7 x 1.6 x 1.7 cm without an underlying fracture. CT MAXILLOFACIAL FINDINGS The left supraorbital scalp hematoma is again seen. There is no underlying fracture. The paranasal sinuses and mastoid air cells are clear. Bilateral lens replacements are present. The globes and orbits are otherwise intact. There is no significant postseptal inflammation on the left. Minimal infraorbital edema is present on the left. The mandible is intact and located. Torus mandibularis are noted bilaterally. Soft tissues of the face and muscles of mastication are otherwise within normal limits. Salivary glands are unremarkable. CT CERVICAL SPINE FINDINGS The cervical spine is imaged from the skull base through T2-3. Chronic loss of disc height is present at each level from C4-5 through C7-T1. There slight degenerative anterolisthesis at C4-5. Chronic endplate changes and uncovertebral spurring is present at these levels in particular. No acute fracture traumatic subluxation is present. Osseous foraminal narrowing is most pronounced at C5-6 and C6-7 bilaterally. There is heterogeneous enlargement of the thyroid bilaterally. No dominant  nodule is present. The lung apices are clear. IMPRESSION: 1. Moderate atrophy and white matter disease without acute intracranial abnormality. 2. 2.7 x 1.6 x 1.7 cm left supraorbital scalp hematoma without underlying fracture or globe injury. 3. Minimal infraorbital soft tissue swelling without other significant facial trauma. 4. Moderate multilevel spondylosis of the cervical spine without evidence for acute fracture or traumatic subluxation. Electronically Signed   By: San Morelle M.D.   On: 08/21/2015 11:58  Ct Cervical Spine Wo Contrast  Result Date: 08/21/2015 CLINICAL DATA:  Tripped and fell and chair today. Left supraorbital hematoma. Personal history of head trauma. No loss of consciousness. The patient was on the floor for 30 minutes prior to receiving help. EXAM: CT HEAD WITHOUT CONTRAST CT MAXILLOFACIAL WITHOUT CONTRAST CT CERVICAL SPINE WITHOUT CONTRAST TECHNIQUE: Multidetector CT imaging of the head, cervical spine, and maxillofacial structures were performed using the standard protocol without intravenous contrast. Multiplanar CT image reconstructions of the cervical spine and maxillofacial structures were also generated. COMPARISON:  CT head and cervical spine 01/02/2013. FINDINGS: CT HEAD FINDINGS Moderate generalized atrophy and diffuse white matter disease is present. No acute infarct, hemorrhage, or mass lesion is present. The ventricles are proportionate to the degree of atrophy. No significant extra-axial fluid collection is present. Extensive atherosclerotic calcifications are present throughout the cavernous internal carotid arteries vertebrobasilar system. The paranasal sinuses and mastoid air cells are clear. A left supraorbital scalp hematoma measures 2.7 x 1.6  x 1.7 cm without an underlying fracture. CT MAXILLOFACIAL FINDINGS The left supraorbital scalp hematoma is again seen. There is no underlying fracture. The paranasal sinuses and mastoid air cells are clear. Bilateral  lens replacements are present. The globes and orbits are otherwise intact. There is no significant postseptal inflammation on the left. Minimal infraorbital edema is present on the left. The mandible is intact and located. Torus mandibularis are noted bilaterally. Soft tissues of the face and muscles of mastication are otherwise within normal limits. Salivary glands are unremarkable. CT CERVICAL SPINE FINDINGS The cervical spine is imaged from the skull base through T2-3. Chronic loss of disc height is present at each level from C4-5 through C7-T1. There slight degenerative anterolisthesis at C4-5. Chronic endplate changes and uncovertebral spurring is present at these levels in particular. No acute fracture traumatic subluxation is present. Osseous foraminal narrowing is most pronounced at C5-6 and C6-7 bilaterally. There is heterogeneous enlargement of the thyroid bilaterally. No dominant nodule is present. The lung apices are clear. IMPRESSION: 1. Moderate atrophy and white matter disease without acute intracranial abnormality. 2. 2.7 x 1.6 x 1.7 cm left supraorbital scalp hematoma without underlying fracture or globe injury. 3. Minimal infraorbital soft tissue swelling without other significant facial trauma. 4. Moderate multilevel spondylosis of the cervical spine without evidence for acute fracture or traumatic subluxation. Electronically Signed   By: San Morelle M.D.   On: 08/21/2015 11:58  Pelvis Portable  Result Date: 08/22/2015 CLINICAL DATA:  Post ORIF left hip fracture. EXAM: PORTABLE PELVIS 1-2 VIEWS COMPARISON:  Preoperative radiographs yesterday FINDINGS: Interval intra medullary nail and like screw fixation of comminuted intertrochanteric left hip fracture. Alignment is improved from preoperative exam. No apparent immediate postoperative complication. No additional acute fracture of the bony pelvis. IMPRESSION: Post ORIF intertrochanteric left hip fracture. No immediate postoperative  complication. Electronically Signed   By: Jeb Levering M.D.   On: 08/22/2015 23:53   Dg Chest Portable 1 View  Result Date: 08/21/2015 CLINICAL DATA:  Unwitnessed fall from chair today.  Hip fracture. EXAM: PORTABLE CHEST 1 VIEW COMPARISON:  Two-view chest x-ray 01/02/2013 FINDINGS: The heart size is normal. Lung volumes remain low. Atherosclerotic changes are noted at the aortic arch. Chronic interstitial changes are stable. No acute fractures are present.  There is no pneumothorax. IMPRESSION: 1. No acute cardiopulmonary disease or significant interval change. 2. Stable chronic interstitial coarsening. 3. Atherosclerosis of the thoracic aorta. Electronically Signed   By: San Morelle M.D.   On: 08/21/2015 12:17  Dg C-arm 1-60 Min  Result Date: 08/22/2015 CLINICAL DATA:  ORIF left hip fracture. EXAM: DG C-ARM 61-120 MIN; LEFT FEMUR 2 VIEWS COMPARISON:  Preoperative radiographs. FINDINGS: Four fluoroscopic spot images from the operating room post fixation of intertrochanteric left hip fracture demonstrate intra medullary femoral nail with distal locking and proximal lag screws. The fracture is in improved alignment. Total fluoroscopy time 1 minutes 15 seconds. IMPRESSION: Intraoperative fluoroscopy during intertrochanteric left hip fracture ORIF. Electronically Signed   By: Jeb Levering M.D.   On: 08/22/2015 23:51   Dg Hip Unilat W Or Wo Pelvis 2-3 Views Left  Result Date: 08/21/2015 CLINICAL DATA:  Fall at home.  Left hip pain. EXAM: DG HIP (WITH OR WITHOUT PELVIS) 2-3V LEFT COMPARISON:  None. FINDINGS: There is a left femoral intertrochanteric fracture with mild displacement. Slight varus angulation. No subluxation or dislocation. Mild degenerative changes in the hips bilaterally. IMPRESSION: Left femoral intertrochanteric fracture with mild displacement and varus angulation. Electronically Signed  By: Rolm Baptise M.D.   On: 08/21/2015 11:29  Dg Femur Min 2 Views Left  Result  Date: 08/22/2015 CLINICAL DATA:  ORIF left hip fracture. EXAM: DG C-ARM 61-120 MIN; LEFT FEMUR 2 VIEWS COMPARISON:  Preoperative radiographs. FINDINGS: Four fluoroscopic spot images from the operating room post fixation of intertrochanteric left hip fracture demonstrate intra medullary femoral nail with distal locking and proximal lag screws. The fracture is in improved alignment. Total fluoroscopy time 1 minutes 15 seconds. IMPRESSION: Intraoperative fluoroscopy during intertrochanteric left hip fracture ORIF. Electronically Signed   By: Jeb Levering M.D.   On: 08/22/2015 23:51   Dg Femur Min 2 Views Left  Result Date: 08/21/2015 CLINICAL DATA:  Unwitnessed fall. Patient complains of left hip pain. EXAM: LEFT FEMUR 2 VIEWS COMPARISON:  Hip radiograph 08/21/2015 FINDINGS: Re- demonstrated is left femoral intertrochanteric oblique fracture with mild displacement and varus angulation of the distal fracture fragment. There is generalized osteopenia. No distal femoral fractures are seen. IMPRESSION: Known left femoral intertrochanteric fracture with mild displacement and varus angulation. Osteopenia. Electronically Signed   By: Fidela Salisbury M.D.   On: 08/21/2015 13:11  Dg Femur Port Min 2 Views Left  Result Date: 08/22/2015 CLINICAL DATA:  Status post IM nail left femur. EXAM: LEFT FEMUR PORTABLE 2 VIEWS COMPARISON:  Preoperative radiographs yesterday. FINDINGS: Intra medullary nail with distal locking and proximal lag screw traverses intertrochanteric left hip fracture. Fracture is in improved alignment. Recent postsurgical change includes air in the subcutaneous soft tissues. No immediate complication is seen. IMPRESSION: ORIF of intertrochanteric left hip fracture in improved alignment. Electronically Signed   By: Jeb Levering M.D.   On: 08/22/2015 23:49   Ct Maxillofacial Wo Contrast  Result Date: 08/21/2015 CLINICAL DATA:  Tripped and fell and chair today. Left supraorbital hematoma.  Personal history of head trauma. No loss of consciousness. The patient was on the floor for 30 minutes prior to receiving help. EXAM: CT HEAD WITHOUT CONTRAST CT MAXILLOFACIAL WITHOUT CONTRAST CT CERVICAL SPINE WITHOUT CONTRAST TECHNIQUE: Multidetector CT imaging of the head, cervical spine, and maxillofacial structures were performed using the standard protocol without intravenous contrast. Multiplanar CT image reconstructions of the cervical spine and maxillofacial structures were also generated. COMPARISON:  CT head and cervical spine 01/02/2013. FINDINGS: CT HEAD FINDINGS Moderate generalized atrophy and diffuse white matter disease is present. No acute infarct, hemorrhage, or mass lesion is present. The ventricles are proportionate to the degree of atrophy. No significant extra-axial fluid collection is present. Extensive atherosclerotic calcifications are present throughout the cavernous internal carotid arteries vertebrobasilar system. The paranasal sinuses and mastoid air cells are clear. A left supraorbital scalp hematoma measures 2.7 x 1.6 x 1.7 cm without an underlying fracture. CT MAXILLOFACIAL FINDINGS The left supraorbital scalp hematoma is again seen. There is no underlying fracture. The paranasal sinuses and mastoid air cells are clear. Bilateral lens replacements are present. The globes and orbits are otherwise intact. There is no significant postseptal inflammation on the left. Minimal infraorbital edema is present on the left. The mandible is intact and located. Torus mandibularis are noted bilaterally. Soft tissues of the face and muscles of mastication are otherwise within normal limits. Salivary glands are unremarkable. CT CERVICAL SPINE FINDINGS The cervical spine is imaged from the skull base through T2-3. Chronic loss of disc height is present at each level from C4-5 through C7-T1. There slight degenerative anterolisthesis at C4-5. Chronic endplate changes and uncovertebral spurring is  present at these levels in particular. No  acute fracture traumatic subluxation is present. Osseous foraminal narrowing is most pronounced at C5-6 and C6-7 bilaterally. There is heterogeneous enlargement of the thyroid bilaterally. No dominant nodule is present. The lung apices are clear. IMPRESSION: 1. Moderate atrophy and white matter disease without acute intracranial abnormality. 2. 2.7 x 1.6 x 1.7 cm left supraorbital scalp hematoma without underlying fracture or globe injury. 3. Minimal infraorbital soft tissue swelling without other significant facial trauma. 4. Moderate multilevel spondylosis of the cervical spine without evidence for acute fracture or traumatic subluxation. Electronically Signed   By: San Morelle M.D.   On: 08/21/2015 11:58   Assessment/Plan There are no diagnoses linked to this encounter HTN (hypertension) controlled, continue Lisinopril 40mg , Metoprolol succinate 100mg  daily.  09/09/15 Hgb 10.1, Na 137, K 4.4, Bun 26, creat 0.88    Constipation Stable, contineu Fiber Laxative daily, Senna S I bid.     Type 2 diabetes, controlled, with neuropathy (HCC) Hgb a1c 5.7 08/30/15  Dementia Gradual decline, continue Namenda and Aricept, SNF for care needs.     Stage II pressure ulcer of left heel Left heal ruptured blister wound, small intact skin remained medial aspect of the wound, superficial stage II pressure ulcer, no s/s of infection, pressure reduction, foam dressing, healing nicely    UTI (urinary tract infection) 09/09/15 Hgb 10.1, Na 137, K 4.4, Bun 26, creat 0.88 09/11/15 urine culture P. Mirabilis 09/12/15 10 day course of Cipro 250mg  bid.  09/12/15 Hgb 10.8   Acute blood loss anemia 09/09/15 Hgb 10.1, Na 137, K 4.4, Bun 26, creat 0.88 09/12/15 Hgb 10.8 Continue Fe   Anxiety state Stable.   Hypokalemia 09/09/15 Hgb 10.1, Na 137, K 4.4, Bun 26, creat 0.88 09/11/15 urine culture P. Mirabilis 09/12/15 10 day course of Cipro 250mg  bid.  09/12/15 Hgb  10.8      Family/ staff Communication:continue SNF for care assistance.   Labs/tests ordered:  CBC done 09/12/15

## 2015-09-13 NOTE — Assessment & Plan Note (Signed)
Gradual decline, continue Namenda and Aricept, SNF for care needs.

## 2015-09-13 NOTE — Assessment & Plan Note (Signed)
Hgb a1c 5.7 08/30/15

## 2015-09-13 NOTE — Assessment & Plan Note (Signed)
Stable

## 2015-09-29 ENCOUNTER — Encounter: Payer: Self-pay | Admitting: Nurse Practitioner

## 2015-09-29 ENCOUNTER — Non-Acute Institutional Stay (SKILLED_NURSING_FACILITY): Payer: Medicare Other | Admitting: Nurse Practitioner

## 2015-09-29 DIAGNOSIS — F411 Generalized anxiety disorder: Secondary | ICD-10-CM | POA: Diagnosis not present

## 2015-09-29 DIAGNOSIS — D62 Acute posthemorrhagic anemia: Secondary | ICD-10-CM

## 2015-09-29 DIAGNOSIS — L89622 Pressure ulcer of left heel, stage 2: Secondary | ICD-10-CM

## 2015-09-29 DIAGNOSIS — F0390 Unspecified dementia without behavioral disturbance: Secondary | ICD-10-CM | POA: Diagnosis not present

## 2015-09-29 DIAGNOSIS — R319 Hematuria, unspecified: Secondary | ICD-10-CM

## 2015-09-29 DIAGNOSIS — K5900 Constipation, unspecified: Secondary | ICD-10-CM

## 2015-09-29 DIAGNOSIS — I10 Essential (primary) hypertension: Secondary | ICD-10-CM | POA: Diagnosis not present

## 2015-09-29 DIAGNOSIS — E1140 Type 2 diabetes mellitus with diabetic neuropathy, unspecified: Secondary | ICD-10-CM

## 2015-09-29 DIAGNOSIS — N318 Other neuromuscular dysfunction of bladder: Secondary | ICD-10-CM | POA: Diagnosis not present

## 2015-09-29 DIAGNOSIS — R600 Localized edema: Secondary | ICD-10-CM | POA: Diagnosis not present

## 2015-09-29 DIAGNOSIS — N390 Urinary tract infection, site not specified: Secondary | ICD-10-CM | POA: Diagnosis not present

## 2015-09-29 DIAGNOSIS — S72002S Fracture of unspecified part of neck of left femur, sequela: Secondary | ICD-10-CM

## 2015-09-29 NOTE — Progress Notes (Signed)
Location:   [facility] Room Number: 14 Place of Service:  SNF (31) Provider:  Manly Nestle, Manxie  NP  Jeanmarie Hubert, MD  Patient Care Team: Estill Dooms, MD as PCP - General (Internal Medicine) Annaliz Aven Otho Darner, NP as Nurse Practitioner (Internal Medicine)  Extended Emergency Contact Information Primary Emergency Contact: Hanzaker,[name] Address: [address]          [place], [place] [address] Johnnette Litter of [place] Phone: [phone] Mobile Phone: [phone] Relation: Daughter Secondary Emergency [place] [address] [place] Phone: [phone] Relation: Grandson  Code Status:  DNR Goals of care: Advanced Directive information Advanced Directives 09/29/2015  Does patient have an advance directive? Yes  Type of Advance Directive Out of facility DNR (pink MOST or yellow form);[place];Living will  Does patient want to make changes to advanced directive? No - Patient declined  Copy of advanced directive(s) in chart? Yes  Pre-existing out of facility DNR order (yellow form or pink MOST form) -     Chief Complaint  Patient presents with  . Acute Visit    Increased edema in left leg from mid-calf and outer leg.    HPI:  Pt is a 82 y.o. female seen today for an acute visit for left leg edema, no redness, pain, cordlike upon palpation, negative for Homans sign. S/p left hip surgery, current left heel wound from pressure persisted.   Hx of dementia, taking Aricept and Namenda, resides in SNF for care needs. Blood pressure is controlled on Metoprolol and Lisinopril. Post op anemia, taking Fe, last Hgb 10.8 09/12/15      Past Medical History:  Diagnosis Date  . Atrial fibrillation ([facility])   . Bradycardia   . Dementia   . Depression   . Diabetes mellitus without complication ([facility])   . Fall at nursing home   . Glaucoma   . HOH (hard of hearing)   . Hypertension   . Lower back  pain   . Melanoma ([facility]) 1960   left shoulder skin  . Muscle pain   . Peripheral neuropathy ([facility])   . TBI (traumatic brain injury) ([facility])   . Umbilical hernia   . Wears glasses    Past Surgical History:  Procedure Laterality Date  . FRACTURE SURGERY Left 08/22/2015   hip  . INTRAMEDULLARY (IM) NAIL INTERTROCHANTERIC Left 08/22/2015   Procedure: INTRAMEDULLARY (IM) NAIL INTERTROCHANTRIC;  Surgeon: Rod Can, MD;  Location: [facility];  Service: Orthopedics;  Laterality: Left;  . SKIN CANCER EXCISION Left 1960's   melanoma shoulder    Allergies  Allergen Reactions  . Codeine Other (See Comments)    Per [facility]      Medication List       Accurate as of 09/29/15 12:48 PM. Always use your most recent med list.          acetaminophen 325 MG tablet Commonly known as:  TYLENOL Take 650 mg by mouth every 4 (four) hours as needed for moderate pain.   aspirin 81 MG EC tablet Take 1 tablet (81 mg total) by mouth 2 (two) times daily with a meal.   CERTAVITE/ANTIOXIDANTS PO Take 1 tablet by mouth daily.   donepezil 10 MG tablet Commonly known as:  ARICEPT Take 10 mg by mouth at bedtime.   feeding supplement Liqd Take 1 Container by mouth 2 (two) times daily between meals. Increase to 120 ml three  time daily.   ferrous sulfate 325 (65 FE) MG tablet Take one tablet twice daily to correct anemia   FIBER-CAPS PO Take 1 capsule by mouth daily.   ipratropium-albuterol 0.5-2.5 (3) MG/3ML Soln Commonly known as:  DUONEB Take 3 mLs by nebulization 2 (two) times daily as needed (for Shortness of breath).   lisinopril 40 MG tablet Commonly known as:  PRINIVIL,ZESTRIL Take 40 mg by mouth daily. For HTN   memantine 10 MG tablet Commonly known as:  NAMENDA Take 10 mg by mouth 2 (two) times daily.   metoprolol succinate 100 MG 24 hr tablet Commonly known as:  TOPROL-XL Take 100 mg by mouth daily. Take with or immediately following a meal.   PROSTATE PO Take by mouth. Take 30 ml  twice daily to promote wound healing may mix with 4-8 oz fluid   sennosides-docusate sodium 8.6-50 MG tablet Commonly known as:  SENOKOT-S Take 1 tablet by mouth daily. Hold for loose stool   traMADol 50 MG tablet Commonly known as:  ULTRAM Take 50 mg by mouth every 6 (six) hours.       Review of Systems  Constitutional: Positive for activity change, appetite change and fatigue.  HENT: Negative.   Eyes: Positive for visual disturbance (corrective lenses).  Respiratory: Negative.   Cardiovascular: Positive for leg swelling. Negative for chest pain and palpitations.       Left leg swelling  Gastrointestinal: Negative.   Endocrine: Positive for polyuria.       Diabetic with neuropathy  Genitourinary: Positive for frequency. Negative for dysuria and flank pain.       Episodes of incontinence.   Musculoskeletal: Positive for gait problem.  Skin:       Left hip surgical incisions healed,  Left heel ruptured blister wound, healing nicely  Allergic/Immunologic: Negative.   Neurological: Positive for light-headedness. Negative for tremors, seizures, syncope, facial asymmetry, speech difficulty, weakness, numbness and headaches.       Dementia  Hematological: Bruises/bleeds easily.  Psychiatric/Behavioral: Positive for confusion, hallucinations and sleep disturbance.    Immunization History  Administered Date(s) Administered  . Influenza-Unspecified 11/10/2012, 10/27/2013, 10/28/2014  . PPD Test 11/17/2011, 11/16/2014, 08/24/2015  . Pneumococcal Polysaccharide-23 11/23/1999  . Td 10/22/2000   Pertinent  Health Maintenance Due  Topic Date Due  . OPHTHALMOLOGY EXAM  09/24/1930  . DEXA SCAN  09/23/1985  . PNA vac Low Risk Adult (2 of 2 - PCV13) 11/22/2000  . INFLUENZA VACCINE  08/23/2015  . FOOT EXAM  10/26/2015  . HEMOGLOBIN A1C  02/29/2016   Fall Risk  09/09/2015 11/29/2014 11/09/2014 10/26/2014 10/27/2013  Falls in the past year? Yes No No No No  Injury with Fall? Yes - - - -    Risk for fall due to : - - - - Impaired mobility;Impaired balance/gait;Mental status change   Functional Status Survey:    Vitals:   09/29/15 0850  BP: (!) 154/82  Pulse: 80  Resp: 18  Temp: 97.1 F (36.2 C)  SpO2: 95%  Weight: 115 lb 3.2 oz (52.3 kg)  Height: 4\' 11"  (1.499 m)   Body mass index is 23.27 kg/m. Physical Exam  Constitutional: She appears well-developed and well-nourished.  HENT:  Head: Normocephalic and atraumatic.  Right Ear: External ear normal.  Left Ear: External ear normal.  Eyes: Conjunctivae and EOM are normal. Pupils are equal, round, and reactive to light.  Neck: Neck supple. No JVD present. No tracheal deviation present. No thyromegaly present.  Cardiovascular: Normal rate.  Murmur heard.  Systolic murmur is present with a grade of 2/6  Pulmonary/Chest: Effort normal and breath sounds normal. No respiratory distress. She has no rales.  Abdominal: Soft. Bowel sounds are normal. She exhibits no mass. There is no tenderness.  Musculoskeletal: She exhibits no edema or tenderness.  Recent fracture of the left hip. Unstable gait. Using rolling walker. Mild scoliosis of thoracic and lumbar spine. Left leg 1+ edema  Lymphadenopathy:    She has no cervical adenopathy.  Neurological: She is alert. She has normal reflexes. No cranial nerve deficit. She exhibits normal muscle tone. Coordination normal.  Demented  Skin: Skin is warm and dry. No rash noted. No erythema.  Left hip surgical incisions healed,  Left heel ruptured blister wound, small intact skin remained medial aspect of the wound, superficial stage II pressure ulcer, no s/s of infection, pressure reduction, foam dressing, healing nicely.    Psychiatric: She has a normal mood and affect. Her behavior is normal. Her speech is not delayed and not slurred. She is not agitated, not aggressive, not hyperactive, not slowed, not withdrawn, not actively hallucinating and not combative. Thought content is not  paranoid and not delusional. Cognition and memory are impaired. She exhibits abnormal recent memory.    Labs reviewed:  Recent Labs  08/22/15 0429 08/22/15 1020 08/23/15 0514 08/23/15 0730 08/24/15 0543 08/29/15 09/09/15  NA 137  --  137  --  137 141 137  K 4.3  --  3.0*  --  3.7 3.8 4.4  CL 107  --  106  --  107  --   --   CO2 23  --  21*  --  23  --   --   GLUCOSE 105*  --  157*  --  145*  --   --   BUN 13  --  11  --  19 22* 26*  CREATININE 0.66  --  0.86  --  0.76 0.6 0.9  CALCIUM 8.5*  --  8.2*  --  8.4*  --   --   MG  --  1.6*  --  1.8  --   --   --     Recent Labs  08/21/15 1209 08/22/15 0429 08/29/15 09/09/15  AST 20 15 17 14   ALT 17 13* 13 14  ALKPHOS 66 51 50 75  BILITOT 0.7 1.2  --   --   PROT 7.2 5.9*  --   --   ALBUMIN 4.3 3.6  --   --     Recent Labs  08/21/15 1209 08/22/15 0429 08/23/15 0514 08/24/15 0543 08/24/15 1433  08/31/15 09/09/15 09/12/15  WBC 12.0* 8.0 13.5* 8.9 10.8*  < > 7.5 6.8 5.1  NEUTROABS 10.6* 5.4  --   --   --   --   --   --   --   HGB 13.9 11.5* 9.0* 7.9* 9.5*  < > 9.5* 10.1* 10.8*  HCT 43.0 34.6* 28.0* 24.3* 29.4*  < > 29* 31* 33*  MCV 84.5 83.6 84.6 83.2 85.0  --   --   --   --   PLT 177 160 131* 122* 119*  < > 276 323 322  < > = values in this interval not displayed. Lab Results  Component Value Date   TSH 0.88 05/05/2015   Lab Results  Component Value Date   HGBA1C 5.7 08/29/2015   Lab Results  Component Value Date   CHOL 191 05/20/2012   HDL 38 (  L) 05/20/2012   LDLCALC 118 (H) 05/20/2012   TRIG 174 (H) 05/20/2012   CHOLHDL 5.0 05/20/2012    Significant Diagnostic Results in last 30 days:  No results found.  Assessment/Plan HTN (hypertension) controlled, continue Lisinopril 40mg , Metoprolol succinate 100mg  daily.  09/09/15 Hgb 10.1, Na 137, K 4.4, Bun 26, creat 0.88    Constipation Stable, contineu Fiber Laxative daily, Senna S I bid.      Type 2 diabetes, controlled, with neuropathy (HCC) Hgb a1c  5.7 08/30/15, continue diet control  Dementia Gradual decline, continue Namenda and Aricept, SNF for care needs.    Closed left hip fracture (HCC) Healed surgical wounds   Stage II pressure ulcer of left heel Left heal ruptured blister wound, small intact skin remained medial aspect of the wound, superficial stage II pressure ulcer, no s/s of infection, pressure reduction, foam dressing, healing nicely    Hypertonicity of bladder Adult depends for urinary incontinence care  UTI (urinary tract infection) Fully treated and resolved hematuria  Acute blood loss anemia 09/09/15 Hgb 10.1, Na 137, K 4.4, Bun 26, creat 0.88 09/12/15 Hgb 10.8 Continue Fe    Anxiety state Stable.    Leg edema, left Reported left leg edema from mid calf and outer leg. There is mild swelling left lower leg, less than 1+, no redness, no cordlike or pain on palpation, s/p left hip surgery and left heel pressure wound contributory to the swelling, continue to observe for s/s of DVT or cellulitis.      Family/ staff Communication: continue SNF for care assistance.    Labs/tests ordered:  none

## 2015-09-29 NOTE — Assessment & Plan Note (Signed)
09/09/15 Hgb 10.1, Na 137, K 4.4, Bun 26, creat 0.88 09/12/15 Hgb 10.8 Continue Fe

## 2015-09-29 NOTE — Assessment & Plan Note (Signed)
Healed surgical wounds

## 2015-09-29 NOTE — Assessment & Plan Note (Signed)
Hgb a1c 5.7 08/30/15, continue diet control

## 2015-09-29 NOTE — Assessment & Plan Note (Signed)
Reported left leg edema from mid calf and outer leg. There is mild swelling left lower leg, less than 1+, no redness, no cordlike or pain on palpation, s/p left hip surgery and left heel pressure wound contributory to the swelling, continue to observe for s/s of DVT or cellulitis.

## 2015-09-29 NOTE — Assessment & Plan Note (Signed)
Left heal ruptured blister wound, small intact skin remained medial aspect of the wound, superficial stage II pressure ulcer, no s/s of infection, pressure reduction, foam dressing, healing nicely

## 2015-09-29 NOTE — Assessment & Plan Note (Signed)
Gradual decline, continue Namenda and Aricept, SNF for care needs.

## 2015-09-29 NOTE — Assessment & Plan Note (Signed)
Fully treated and resolved hematuria

## 2015-09-29 NOTE — Assessment & Plan Note (Signed)
Adult depends for urinary incontinence care

## 2015-09-29 NOTE — Assessment & Plan Note (Signed)
controlled, continue Lisinopril 40mg , Metoprolol succinate 100mg  daily.  09/09/15 Hgb 10.1, Na 137, K 4.4, Bun 26, creat 0.88

## 2015-09-29 NOTE — Assessment & Plan Note (Signed)
Stable

## 2015-09-29 NOTE — Assessment & Plan Note (Signed)
Stable, contineu Fiber Laxative daily, Senna S I bid.

## 2015-10-11 ENCOUNTER — Other Ambulatory Visit: Payer: Self-pay | Admitting: *Deleted

## 2015-10-18 ENCOUNTER — Encounter: Payer: Self-pay | Admitting: Nurse Practitioner

## 2015-10-18 ENCOUNTER — Non-Acute Institutional Stay (SKILLED_NURSING_FACILITY): Payer: Medicare Other | Admitting: Nurse Practitioner

## 2015-10-18 DIAGNOSIS — I10 Essential (primary) hypertension: Secondary | ICD-10-CM

## 2015-10-18 DIAGNOSIS — S72002S Fracture of unspecified part of neck of left femur, sequela: Secondary | ICD-10-CM | POA: Diagnosis not present

## 2015-10-18 DIAGNOSIS — R600 Localized edema: Secondary | ICD-10-CM

## 2015-10-18 DIAGNOSIS — R634 Abnormal weight loss: Secondary | ICD-10-CM

## 2015-10-18 DIAGNOSIS — K5900 Constipation, unspecified: Secondary | ICD-10-CM | POA: Diagnosis not present

## 2015-10-18 DIAGNOSIS — E1140 Type 2 diabetes mellitus with diabetic neuropathy, unspecified: Secondary | ICD-10-CM | POA: Diagnosis not present

## 2015-10-18 DIAGNOSIS — R627 Adult failure to thrive: Secondary | ICD-10-CM

## 2015-10-18 DIAGNOSIS — F411 Generalized anxiety disorder: Secondary | ICD-10-CM | POA: Diagnosis not present

## 2015-10-18 DIAGNOSIS — F0390 Unspecified dementia without behavioral disturbance: Secondary | ICD-10-CM | POA: Diagnosis not present

## 2015-10-18 DIAGNOSIS — L89622 Pressure ulcer of left heel, stage 2: Secondary | ICD-10-CM | POA: Diagnosis not present

## 2015-10-18 DIAGNOSIS — D62 Acute posthemorrhagic anemia: Secondary | ICD-10-CM

## 2015-10-18 NOTE — Assessment & Plan Note (Signed)
Hgb a1c 5.7 08/30/15, continue diet control

## 2015-10-18 NOTE — Assessment & Plan Note (Signed)
Gradual decline, continue Namenda and Aricept, SNF for care needs.

## 2015-10-18 NOTE — Assessment & Plan Note (Signed)
controlled, continue Lisinopril 40mg , Metoprolol succinate 100mg  daily.  09/09/15 Hgb 10.1, Na 137, K 4.4, Bun 26, creat 0.88

## 2015-10-18 NOTE — Progress Notes (Signed)
Location:  [facility] Room Number: 39 Place of Service:  SNF (31) Provider:  Mast, Manxie  NP  Jeanmarie Hubert, MD  Patient Care Team: Estill Dooms, MD as PCP - General (Internal Medicine) Man Otho Darner, NP as Nurse Practitioner (Internal Medicine)  Extended Emergency Contact Information Primary Emergency Contact: Hanzaker,[name] Address: [address]          [place], [place] [address] Johnnette Litter of [place] Phone: [phone] Mobile Phone: [phone] Relation: Daughter Secondary Emergency [place]: [place] [address] [place] Phone: [phone] Relation: Grandson  Code Status:  DNR Goals of care: Advanced Directive information Advanced Directives 10/18/2015  Does patient have an advance directive? Yes  Type of [place] of [place];Out of facility DNR (pink MOST or yellow form);Living will  Does patient want to make changes to advanced directive? No - Patient declined  Copy of advanced directive(s) in chart? Yes  Pre-existing out of facility DNR order (yellow form or pink MOST form) -     Chief Complaint  Patient presents with  . Acute Visit    evaluation for hospice    HPI:  Pt is a 82 y.o. female seen today for an acute visit for evaluation of FTT, weight loss, neal total dependent of ADL, progression of dementia.     Hx of dementia, taking Aricept and Namenda, resides in SNF for care needs. Blood pressure is controlled on Metoprolol and Lisinopril. Post op anemia, taking Fe, last Hgb 10.8 09/12/15. Healed s/p left hip ORIF and left heel pressure ulcer.      Past Medical History:  Diagnosis Date  . Atrial fibrillation ([facility])   . Bradycardia   . Dementia   . Depression   . Diabetes mellitus without complication ([facility])   . Fall at nursing home   . Glaucoma   . HOH (hard of hearing)   . Hypertension   . Lower back pain   . Melanoma ([facility]) 1960   left shoulder  skin  . Muscle pain   . Peripheral neuropathy ([facility])   . TBI (traumatic brain injury) ([facility])   . Umbilical hernia   . Wears glasses    Past Surgical History:  Procedure Laterality Date  . FRACTURE SURGERY Left 08/22/2015   hip  . INTRAMEDULLARY (IM) NAIL INTERTROCHANTERIC Left 08/22/2015   Procedure: INTRAMEDULLARY (IM) NAIL INTERTROCHANTRIC;  Surgeon: Rod Can, MD;  Location: [facility];  Service: Orthopedics;  Laterality: Left;  . SKIN CANCER EXCISION Left 1960's   melanoma shoulder    Allergies  Allergen Reactions  . Codeine Other (See Comments)    Per [facility]      Medication List       Accurate as of 10/18/15  3:21 PM. Always use your most recent med list.          acetaminophen 325 MG tablet Commonly known as:  TYLENOL Take 650 mg by mouth every 4 (four) hours as needed for moderate pain.   aspirin 81 MG EC tablet Take 1 tablet (81 mg total) by mouth 2 (two) times daily with a meal.   CERTAVITE/ANTIOXIDANTS PO Take 1 tablet by mouth daily.   donepezil 10 MG tablet Commonly known as:  ARICEPT Take 10 mg by mouth at bedtime.   feeding supplement Liqd Take 1 Container by mouth 2 (two) times daily between meals. Increase to 120 ml three time daily.   ferrous sulfate  325 (65 FE) MG tablet Take one tablet twice daily to correct anemia   FIBER-CAPS PO Take 1 capsule by mouth daily.   ipratropium-albuterol 0.5-2.5 (3) MG/3ML Soln Commonly known as:  DUONEB Take 3 mLs by nebulization 2 (two) times daily as needed (for Shortness of breath).   lisinopril 40 MG tablet Commonly known as:  PRINIVIL,ZESTRIL Take 40 mg by mouth daily. For HTN   memantine 10 MG tablet Commonly known as:  NAMENDA Take 10 mg by mouth 2 (two) times daily.   metoprolol succinate 100 MG 24 hr tablet Commonly known as:  TOPROL-XL Take 100 mg by mouth daily. Take with or immediately following a meal.   PROSTATE PO Take by mouth. Take 30 ml twice daily to promote wound healing may mix  with 4-8 oz fluid   sennosides-docusate sodium 8.6-50 MG tablet Commonly known as:  SENOKOT-S Take 1 tablet by mouth daily. Hold for loose stool   traMADol 50 MG tablet Commonly known as:  ULTRAM Take 50 mg by mouth every 8 (eight) hours as needed.       Review of Systems  Constitutional: Positive for activity change, appetite change, fatigue and unexpected weight change.  HENT: Negative.   Eyes: Positive for visual disturbance (corrective lenses).  Respiratory: Negative.   Cardiovascular: Positive for leg swelling. Negative for chest pain and palpitations.       Left foot edema, trace  Gastrointestinal: Negative.   Endocrine: Positive for polyuria.       Diabetic with neuropathy  Genitourinary: Positive for frequency. Negative for dysuria and flank pain.       Episodes of incontinence.   Musculoskeletal: Positive for gait problem.  Skin:       Left hip surgical incisions healed,  Left heel ruptured blister wound, healing  Allergic/Immunologic: Negative.   Neurological: Positive for light-headedness. Negative for tremors, seizures, syncope, facial asymmetry, speech difficulty, weakness, numbness and headaches.       Dementia  Hematological: Bruises/bleeds easily.  Psychiatric/Behavioral: Positive for confusion, hallucinations and sleep disturbance.    Immunization History  Administered Date(s) Administered  . Influenza-Unspecified 11/10/2012, 10/27/2013, 10/28/2014  . PPD Test 11/17/2011, 11/16/2014, 08/24/2015  . Pneumococcal Polysaccharide-23 11/23/1999  . Td 10/22/2000   Pertinent  Health Maintenance Due  Topic Date Due  . OPHTHALMOLOGY EXAM  09/24/1930  . DEXA SCAN  09/23/1985  . PNA vac Low Risk Adult (2 of 2 - PCV13) 11/22/2000  . INFLUENZA VACCINE  08/23/2015  . FOOT EXAM  10/26/2015  . HEMOGLOBIN A1C  02/29/2016   Fall Risk  09/09/2015 11/29/2014 11/09/2014 10/26/2014 10/27/2013  Falls in the past year? Yes No No No No  Injury with Fall? Yes - - - -  Risk for  fall due to : - - - - Impaired mobility;Impaired balance/gait;Mental status change   Functional Status Survey:    Vitals:   10/18/15 1137  BP: (!) 148/78  Pulse: 78  Resp: 16  Temp: 97.8 F (36.6 C)  Weight: 117 lb 6.4 oz (53.3 kg)  Height: 4\' 11"  (1.499 m)   Body mass index is 23.71 kg/m. Physical Exam  Constitutional: She appears well-developed and well-nourished.  HENT:  Head: Normocephalic and atraumatic.  Right Ear: External ear normal.  Left Ear: External ear normal.  Eyes: Conjunctivae and EOM are normal. Pupils are equal, round, and reactive to light.  Neck: Neck supple. No JVD present. No tracheal deviation present. No thyromegaly present.  Cardiovascular: Normal rate.   Murmur heard.  Systolic  murmur is present with a grade of 2/6  Pulmonary/Chest: Effort normal and breath sounds normal. No respiratory distress. She has no rales.  Abdominal: Soft. Bowel sounds are normal. She exhibits no mass. There is no tenderness.  Musculoskeletal: She exhibits edema. She exhibits no tenderness.  Recent fracture of the left hip. Unstable gait. Using rolling walker. Mild scoliosis of thoracic. Trace edema left foot.   Lymphadenopathy:    She has no cervical adenopathy.  Neurological: She is alert. She has normal reflexes. No cranial nerve deficit. She exhibits normal muscle tone. Coordination normal.  Demented  Skin: Skin is warm and dry. No rash noted. No erythema.  Left hip surgical incisions healed,  Left heel ruptured blister, developed into a superficial stage II pressure ulcer, healing  Psychiatric: She has a normal mood and affect. Her behavior is normal. Her speech is not delayed and not slurred. She is not agitated, not aggressive, not hyperactive, not slowed, not withdrawn, not actively hallucinating and not combative. Thought content is not paranoid and not delusional. Cognition and memory are impaired. She exhibits abnormal recent memory.    Labs reviewed:  Recent  Labs  08/22/15 0429 08/22/15 1020 08/23/15 0514 08/23/15 0730 08/24/15 0543 08/29/15 09/09/15  NA 137  --  137  --  137 141 137  K 4.3  --  3.0*  --  3.7 3.8 4.4  CL 107  --  106  --  107  --   --   CO2 23  --  21*  --  23  --   --   GLUCOSE 105*  --  157*  --  145*  --   --   BUN 13  --  11  --  19 22* 26*  CREATININE 0.66  --  0.86  --  0.76 0.6 0.9  CALCIUM 8.5*  --  8.2*  --  8.4*  --   --   MG  --  1.6*  --  1.8  --   --   --     Recent Labs  08/21/15 1209 08/22/15 0429 08/29/15 09/09/15  AST 20 15 17 14   ALT 17 13* 13 14  ALKPHOS 66 51 50 75  BILITOT 0.7 1.2  --   --   PROT 7.2 5.9*  --   --   ALBUMIN 4.3 3.6  --   --     Recent Labs  08/21/15 1209 08/22/15 0429 08/23/15 0514 08/24/15 0543 08/24/15 1433  08/31/15 09/09/15 09/12/15  WBC 12.0* 8.0 13.5* 8.9 10.8*  < > 7.5 6.8 5.1  NEUTROABS 10.6* 5.4  --   --   --   --   --   --   --   HGB 13.9 11.5* 9.0* 7.9* 9.5*  < > 9.5* 10.1* 10.8*  HCT 43.0 34.6* 28.0* 24.3* 29.4*  < > 29* 31* 33*  MCV 84.5 83.6 84.6 83.2 85.0  --   --   --   --   PLT 177 160 131* 122* 119*  < > 276 323 322  < > = values in this interval not displayed. Lab Results  Component Value Date   TSH 0.88 05/05/2015   Lab Results  Component Value Date   HGBA1C 5.7 08/29/2015   Lab Results  Component Value Date   CHOL 191 05/20/2012   HDL 38 (L) 05/20/2012   LDLCALC 118 (H) 05/20/2012   TRIG 174 (H) 05/20/2012   CHOLHDL 5.0 05/20/2012    Significant  Diagnostic Results in last 30 days:  No results found.  Assessment/Plan There are no diagnoses linked to this encounter.Adult failure to thrive Hospice referral.   HTN (hypertension) controlled, continue Lisinopril 40mg , Metoprolol succinate 100mg  daily.  09/09/15 Hgb 10.1, Na 137, K 4.4, Bun 26, creat 0.88     Constipation Stable, contineu Fiber Laxative daily, Senna S I bid.  Type 2 diabetes, controlled, with neuropathy (HCC) Hgb a1c 5.7 08/30/15, continue diet  control  Dementia Gradual decline, continue Namenda and Aricept, SNF for care needs.   Closed left hip fracture (HCC) Healed clinically  Stage II pressure ulcer of left heel Healing nicely  Acute blood loss anemia 09/09/15 Hgb 10.1, Na 137, K 4.4, Bun 26, creat 0.88 09/12/15 Hgb 10.8 Continue Fe    Anxiety state Stable.    Loss of weight Continue nutrition supplement.   Leg edema, left Near resolution, related to left hip surgery     Family/ staff Communication: Hospice referral.   Labs/tests ordered:  none

## 2015-10-18 NOTE — Assessment & Plan Note (Signed)
Healed clinically

## 2015-10-18 NOTE — Assessment & Plan Note (Signed)
Hospice referral

## 2015-10-18 NOTE — Assessment & Plan Note (Addendum)
Healing nicely.  

## 2015-10-18 NOTE — Assessment & Plan Note (Signed)
Near resolution, related to left hip surgery

## 2015-10-18 NOTE — Assessment & Plan Note (Signed)
Continue nutrition supplement.

## 2015-10-18 NOTE — Assessment & Plan Note (Signed)
Stable

## 2015-10-18 NOTE — Assessment & Plan Note (Signed)
Stable, contineu Fiber Laxative daily, Senna S I bid.

## 2015-10-18 NOTE — Assessment & Plan Note (Signed)
09/09/15 Hgb 10.1, Na 137, K 4.4, Bun 26, creat 0.88 09/12/15 Hgb 10.8 Continue Fe

## 2015-10-25 ENCOUNTER — Other Ambulatory Visit: Payer: Self-pay | Admitting: Nurse Practitioner

## 2015-11-01 ENCOUNTER — Non-Acute Institutional Stay (SKILLED_NURSING_FACILITY): Payer: Medicare Other | Admitting: Nurse Practitioner

## 2015-11-01 ENCOUNTER — Encounter: Payer: Self-pay | Admitting: Nurse Practitioner

## 2015-11-01 DIAGNOSIS — I10 Essential (primary) hypertension: Secondary | ICD-10-CM | POA: Diagnosis not present

## 2015-11-01 DIAGNOSIS — K5900 Constipation, unspecified: Secondary | ICD-10-CM | POA: Diagnosis not present

## 2015-11-01 DIAGNOSIS — D62 Acute posthemorrhagic anemia: Secondary | ICD-10-CM

## 2015-11-01 DIAGNOSIS — N390 Urinary tract infection, site not specified: Secondary | ICD-10-CM | POA: Diagnosis not present

## 2015-11-01 DIAGNOSIS — S72002S Fracture of unspecified part of neck of left femur, sequela: Secondary | ICD-10-CM

## 2015-11-01 DIAGNOSIS — L89622 Pressure ulcer of left heel, stage 2: Secondary | ICD-10-CM | POA: Diagnosis not present

## 2015-11-01 DIAGNOSIS — R627 Adult failure to thrive: Secondary | ICD-10-CM | POA: Diagnosis not present

## 2015-11-01 DIAGNOSIS — R319 Hematuria, unspecified: Secondary | ICD-10-CM | POA: Diagnosis not present

## 2015-11-01 DIAGNOSIS — F0150 Vascular dementia without behavioral disturbance: Secondary | ICD-10-CM

## 2015-11-01 NOTE — Assessment & Plan Note (Addendum)
Continue supportive measures. Comfort measures only.

## 2015-11-01 NOTE — Assessment & Plan Note (Signed)
Hematuria vs reported "vaginal bleed", no vaginal bleed upon my examination today, obtain UA C/S. The same complaint of "vaginal bleed" with last UTI which was resolved after UTI was fully treated.

## 2015-11-01 NOTE — Assessment & Plan Note (Signed)
Stable, contineu Fiber Laxative daily, Senna S I bid.

## 2015-11-01 NOTE — Assessment & Plan Note (Addendum)
Gradual decline, 10/25/15 dc''d  Namenda and Aricept, SNF for care needs.

## 2015-11-01 NOTE — Assessment & Plan Note (Addendum)
09/09/15 Hgb 10.1, Na 137, K 4.4, Bun 26, creat 0.88 09/12/15 Hgb 10.8 10/25/15 dc Fe

## 2015-11-01 NOTE — Assessment & Plan Note (Signed)
controlled, continue Lisinopril 40mg , Metoprolol succinate 100mg  daily.  09/09/15 Hgb 10.1, Na 137, K 4.4, Bun 26, creat 0.88

## 2015-11-01 NOTE — Progress Notes (Signed)
Location:  [facility] Room Number: 64 Place of Service:  SNF (31) Provider:  Taeshaun Rames  NP  Jeanmarie Hubert, MD  Patient Care Team: Estill Dooms, MD as PCP - General (Internal Medicine) Trina Asch Otho Darner, NP as Nurse Practitioner (Internal Medicine)  Extended Emergency Contact Information Primary Emergency Contact: Hanzaker,[name] Address: [address]          [place], [place] [address] Johnnette Litter of [place] Phone: [phone] Mobile Phone: [phone] Relation: Daughter Secondary Emergency [place]: [place] [address] [place] of [place] Phone: [phone] Relation: Grandson  Code Status:  DNR Goals of care: Advanced Directive information Advanced Directives 11/01/2015  Does patient have an advance directive? Yes  Type of Paramedic of [place];Living will;Out of facility DNR (pink MOST or yellow form)  Does patient want to make changes to advanced directive? No - Patient declined  Copy of advanced directive(s) in chart? Yes  Pre-existing out of facility DNR order (yellow form or pink MOST form) -     Chief Complaint  Patient presents with  . Acute Visit    Left heel wound, sm amt of vaginal bleeding noted    HPI:  Pt is a 82 y.o. female seen today for an acute visit for " vaginal bleed", the left heel pressure wound. Reported "vaginal bleed", same complained symptoms from previous UTI, no vaginal bleed upon my examination today,   Hx of dementia, off Aricept and Namenda, resides in SNF for care needs. Blood pressure is controlled on Metoprolol and Lisinopril. Post op anemia, off Fe, last Hgb 10.8 09/12/15. Healed s/p left hip ORIF and left heel pressure ulcer, not infected, healing slowly.      Past Medical History:  Diagnosis Date  . Atrial fibrillation ([facility])   . Bradycardia   . Dementia   . Depression   . Diabetes mellitus without complication ([place])   . Fall at nursing home    . Glaucoma   . HOH (hard of hearing)   . Hypertension   . Lower back pain   . Melanoma ([facility]) 1960   left shoulder skin  . Muscle pain   . Peripheral neuropathy ([place])   . TBI (traumatic brain injury) ([facility] [place])   . Umbilical hernia   . Wears glasses    Past Surgical History:  Procedure Laterality Date  . FRACTURE SURGERY Left 08/22/2015   hip  . INTRAMEDULLARY (IM) NAIL INTERTROCHANTERIC Left 08/22/2015   Procedure: INTRAMEDULLARY (IM) NAIL INTERTROCHANTRIC;  Surgeon: Rod Can, MD;  Location: [facility];  Service: Orthopedics;  Laterality: Left;  . SKIN CANCER EXCISION Left 1960's   melanoma shoulder    Allergies  Allergen Reactions  . Codeine Other (See Comments)    Per [facility]      Medication List       Accurate as of 11/01/15 11:59 PM. Always use your most recent med list.          acetaminophen 325 MG tablet Commonly known as:  TYLENOL Take 650 mg by mouth every 4 (four) hours as needed for moderate pain.   aspirin 81 MG EC tablet Take 1 tablet (81 mg total) by mouth 2 (two) times daily with a meal.   CERTAVITE/ANTIOXIDANTS PO Take 1 tablet by mouth daily.   donepezil 10 MG tablet Commonly known as:  ARICEPT Take 10 mg by mouth at bedtime.   feeding supplement Liqd Take 1 Container by  mouth 2 (two) times daily between meals. Increase to 120 ml three time daily.   ferrous sulfate 325 (65 FE) MG tablet Take one tablet twice daily to correct anemia   FIBER-CAPS PO Take 1 capsule by mouth daily.   ipratropium-albuterol 0.5-2.5 (3) MG/3ML Soln Commonly known as:  DUONEB Take 3 mLs by nebulization 2 (two) times daily as needed (for Shortness of breath).   lisinopril 40 MG tablet Commonly known as:  PRINIVIL,ZESTRIL Take 40 mg by mouth daily. For HTN   memantine 10 MG tablet Commonly known as:  NAMENDA Take 10 mg by mouth 2 (two) times daily.   metoprolol succinate 100 MG 24 hr tablet Commonly known as:  TOPROL-XL Take 100 mg by mouth daily. Take with or  immediately following a meal.   PROSTATE PO Take by mouth. Take 30 ml twice daily to promote wound healing may mix with 4-8 oz fluid   sennosides-docusate sodium 8.6-50 MG tablet Commonly known as:  SENOKOT-S Take 1 tablet by mouth daily. Hold for loose stool   traMADol 50 MG tablet Commonly known as:  ULTRAM Take 50 mg by mouth every 8 (eight) hours as needed.   zinc sulfate 220 (50 Zn) MG capsule TAKE ONE CAPSULE BY MOUTH EVERY DAY TIMES 14 DAYS       Review of Systems  Constitutional: Positive for activity change, fatigue and unexpected weight change. Negative for appetite change.  HENT: Negative.   Eyes: Positive for visual disturbance (corrective lenses).  Respiratory: Negative.   Cardiovascular: Positive for leg swelling. Negative for chest pain and palpitations.       Left foot edema, trace  Gastrointestinal: Negative.   Endocrine: Positive for polyuria.       Diabetic with neuropathy  Genitourinary: Positive for frequency. Negative for dysuria, flank pain and vaginal bleeding.       Episodes of incontinence.   Musculoskeletal: Positive for gait problem.  Skin:       Left hip surgical incisions healed,  Left heel ruptured blister wound, healing  Allergic/Immunologic: Negative.   Neurological: Positive for light-headedness. Negative for tremors, seizures, syncope, facial asymmetry, speech difficulty, weakness, numbness and headaches.       Dementia  Hematological: Bruises/bleeds easily.  Psychiatric/Behavioral: Positive for confusion, hallucinations and sleep disturbance.    Immunization History  Administered Date(s) Administered  . Influenza-Unspecified 11/10/2012, 10/27/2013, 10/28/2014  . PPD Test 11/17/2011, 11/16/2014, 08/24/2015  . Pneumococcal Polysaccharide-23 11/23/1999  . Td 10/22/2000   Pertinent  Health Maintenance Due  Topic Date Due  . OPHTHALMOLOGY EXAM  09/24/1930  . DEXA SCAN  09/23/1985  . PNA vac Low Risk Adult (2 of 2 - PCV13) 11/22/2000   . INFLUENZA VACCINE  08/23/2015  . FOOT EXAM  10/26/2015  . HEMOGLOBIN A1C  02/29/2016   Fall Risk  09/09/2015 11/29/2014 11/09/2014 10/26/2014 10/27/2013  Falls in the past year? Yes No No No No  Injury with Fall? Yes - - - -  Risk for fall due to : - - - - Impaired mobility;Impaired balance/gait;Mental status change   Functional Status Survey:    Vitals:   11/01/15 1239  BP: (!) 142/72  Pulse: 82  Resp: 18  Temp: 97.8 F (36.6 C)  SpO2: 95%  Weight: 117 lb 6.4 oz (53.3 kg)  Height: 4\' 11"  (1.499 m)   Body mass index is 23.71 kg/m. Physical Exam  Constitutional: She appears well-developed and well-nourished.  HENT:  Head: Normocephalic and atraumatic.  Right Ear: External ear normal.  Left Ear: External ear normal.  Eyes: Conjunctivae and EOM are normal. Pupils are equal, round, and reactive to light.  Neck: Neck supple. No JVD present. No tracheal deviation present. No thyromegaly present.  Cardiovascular: Normal rate.   Murmur heard.  Systolic murmur is present with a grade of 2/6  Pulmonary/Chest: Effort normal and breath sounds normal. No respiratory distress. She has no rales.  Abdominal: Soft. Bowel sounds are normal. She exhibits no mass. There is no tenderness.  Genitourinary: Vagina normal.  Musculoskeletal: She exhibits edema. She exhibits no tenderness.  Recent fracture of the left hip. Unstable gait. Using rolling walker. Mild scoliosis of thoracic. Trace edema left foot.   Lymphadenopathy:    She has no cervical adenopathy.  Neurological: She is alert. She has normal reflexes. No cranial nerve deficit. She exhibits normal muscle tone. Coordination normal.  Demented  Skin: Skin is warm and dry. No rash noted. No erythema.  Left hip surgical incisions healed,  Left heel ruptured blister, developed into a superficial stage II pressure ulcer, healing  Psychiatric: She has a normal mood and affect. Her behavior is normal. Her speech is not delayed and not  slurred. She is not agitated, not aggressive, not hyperactive, not slowed, not withdrawn, not actively hallucinating and not combative. Thought content is not paranoid and not delusional. Cognition and memory are impaired. She exhibits abnormal recent memory.    Labs reviewed:  Recent Labs  08/22/15 0429 08/22/15 1020 08/23/15 0514 08/23/15 0730 08/24/15 0543 08/29/15 09/09/15  NA 137  --  137  --  137 141 137  K 4.3  --  3.0*  --  3.7 3.8 4.4  CL 107  --  106  --  107  --   --   CO2 23  --  21*  --  23  --   --   GLUCOSE 105*  --  157*  --  145*  --   --   BUN 13  --  11  --  19 22* 26*  CREATININE 0.66  --  0.86  --  0.76 0.6 0.9  CALCIUM 8.5*  --  8.2*  --  8.4*  --   --   MG  --  1.6*  --  1.8  --   --   --     Recent Labs  08/21/15 1209 08/22/15 0429 08/29/15 09/09/15  AST 20 15 17 14   ALT 17 13* 13 14  ALKPHOS 66 51 50 75  BILITOT 0.7 1.2  --   --   PROT 7.2 5.9*  --   --   ALBUMIN 4.3 3.6  --   --     Recent Labs  08/21/15 1209 08/22/15 0429 08/23/15 0514 08/24/15 0543 08/24/15 1433  08/31/15 09/09/15 09/12/15  WBC 12.0* 8.0 13.5* 8.9 10.8*  < > 7.5 6.8 5.1  NEUTROABS 10.6* 5.4  --   --   --   --   --   --   --   HGB 13.9 11.5* 9.0* 7.9* 9.5*  < > 9.5* 10.1* 10.8*  HCT 43.0 34.6* 28.0* 24.3* 29.4*  < > 29* 31* 33*  MCV 84.5 83.6 84.6 83.2 85.0  --   --   --   --   PLT 177 160 131* 122* 119*  < > 276 323 322  < > = values in this interval not displayed. Lab Results  Component Value Date   TSH 0.88 05/05/2015   Lab Results  Component Value Date   HGBA1C 5.7 08/29/2015   Lab Results  Component Value Date   CHOL 191 05/20/2012   HDL 38 (L) 05/20/2012   LDLCALC 118 (H) 05/20/2012   TRIG 174 (H) 05/20/2012   CHOLHDL 5.0 05/20/2012    Significant Diagnostic Results in last 30 days:  No results found.  Assessment/Plan UTI (urinary tract infection) Reported "vaginal bleed", same complained symptoms from previous UTI, no vaginal bleed upon my  examination today, will obtain UA C/S, CBC, CMP, observe the patient.   Stage II pressure ulcer of left heel Change to hydrocolloid dressing q 3 days.   Closed left hip fracture (HCC) Healing nicely, continue Tramadol tid for pain.   HTN (hypertension) controlled, continue Lisinopril 40mg , Metoprolol succinate 100mg  daily.  09/09/15 Hgb 10.1, Na 137, K 4.4, Bun 26, creat 0.88     Constipation Stable, contineu Fiber Laxative daily, Senna S I bid.   Dementia Gradual decline, 10/25/15 dc''d  Namenda and Aricept, SNF for care needs.    Acute blood loss anemia 09/09/15 Hgb 10.1, Na 137, K 4.4, Bun 26, creat 0.88 09/12/15 Hgb 10.8 10/25/15 dc Fe    Hematuria Hematuria vs reported "vaginal bleed", no vaginal bleed upon my examination today, obtain UA C/S. The same complaint of "vaginal bleed" with last UTI which was resolved after UTI was fully treated.   Adult failure to thrive Continue supportive measures. Comfort measures only.    Family/ staff Communication: continue SNF for care assistance.   Labs/tests ordered:   CBC, CMP, UA C/S

## 2015-11-01 NOTE — Assessment & Plan Note (Signed)
Healing nicely, continue Tramadol tid for pain.

## 2015-11-01 NOTE — Assessment & Plan Note (Addendum)
Reported "vaginal bleed", same complained symptoms from previous UTI, no vaginal bleed upon my examination today, will obtain UA C/S, CBC, CMP, observe the patient.

## 2015-11-01 NOTE — Assessment & Plan Note (Signed)
Change to hydrocolloid dressing q 3 days.

## 2015-11-29 ENCOUNTER — Non-Acute Institutional Stay (SKILLED_NURSING_FACILITY): Payer: Medicare Other | Admitting: Nurse Practitioner

## 2015-11-29 ENCOUNTER — Encounter: Payer: Self-pay | Admitting: Nurse Practitioner

## 2015-11-29 DIAGNOSIS — E1140 Type 2 diabetes mellitus with diabetic neuropathy, unspecified: Secondary | ICD-10-CM | POA: Diagnosis not present

## 2015-11-29 DIAGNOSIS — R627 Adult failure to thrive: Secondary | ICD-10-CM

## 2015-11-29 DIAGNOSIS — I10 Essential (primary) hypertension: Secondary | ICD-10-CM | POA: Diagnosis not present

## 2015-11-29 DIAGNOSIS — F0150 Vascular dementia without behavioral disturbance: Secondary | ICD-10-CM

## 2015-11-29 DIAGNOSIS — D62 Acute posthemorrhagic anemia: Secondary | ICD-10-CM | POA: Diagnosis not present

## 2015-11-29 DIAGNOSIS — L089 Local infection of the skin and subcutaneous tissue, unspecified: Secondary | ICD-10-CM | POA: Diagnosis not present

## 2015-11-29 DIAGNOSIS — N318 Other neuromuscular dysfunction of bladder: Secondary | ICD-10-CM | POA: Diagnosis not present

## 2015-11-29 DIAGNOSIS — M5440 Lumbago with sciatica, unspecified side: Secondary | ICD-10-CM

## 2015-11-29 DIAGNOSIS — K5900 Constipation, unspecified: Secondary | ICD-10-CM | POA: Diagnosis not present

## 2015-11-29 DIAGNOSIS — L8990 Pressure ulcer of unspecified site, unspecified stage: Secondary | ICD-10-CM

## 2015-11-29 NOTE — Assessment & Plan Note (Signed)
Adult brief for incontinent care

## 2015-11-29 NOTE — Assessment & Plan Note (Signed)
Continue comfort measures, Hospice service.

## 2015-11-29 NOTE — Assessment & Plan Note (Signed)
Managing pain with prn Morphine.

## 2015-11-29 NOTE — Assessment & Plan Note (Signed)
Stable, contineu Fiber Laxative daily, Senna S I bid.

## 2015-11-29 NOTE — Assessment & Plan Note (Addendum)
controlled, continue Lisinopril 40mg , Metoprolol succinate 100mg  daily. 11/03/15 Na 141, K 3.2, Bun 15, creat 0.61, K supplement started.

## 2015-11-29 NOTE — Assessment & Plan Note (Signed)
Lefft heel pressure ulcer, infected, eschar in the center of the wound, erythematous peri wound, will try topical tx with Santyl and Bactroban ointment, may consider ABT for comfort measures.

## 2015-11-29 NOTE — Progress Notes (Signed)
Location:  [facility] Room Number: 50 Place of Service:  SNF (31) Provider:  Allysson Rinehimer, Manxie  NP  Jeanmarie Hubert, MD  Patient Care Team: Estill Dooms, MD as PCP - General (Internal Medicine) Sevyn Paredez Otho Darner, NP as Nurse Practitioner (Internal Medicine)  Extended Emergency Contact Information Primary Emergency Contact: Hanzaker,[name] Address: [address]          [place], [place] [address] Johnnette Litter of [place] Phone: [phone] Mobile Phone: [phone] Relation: Daughter Secondary Emergency [place]: [place] [address] [place] of [place] Phone: [phone] Relation: Grandson  Code Status:  DNR Goals of care: Advanced Directive information Advanced Directives 11/29/2015  Does patient have an advance directive? Yes  Type of Paramedic of [place];Living will;Out of facility DNR (pink MOST or yellow form)  Does patient want to make changes to advanced directive? No - Patient declined  Copy of advanced directive(s) in chart? Yes  Pre-existing out of facility DNR order (yellow form or pink MOST form) -     Chief Complaint  Patient presents with  . Medical Management of Chronic Issues    HPI:  Pt is a 82 y.o. female seen today for medical management of chronic diseases.    Lefft heel pressure ulcer, infected, eschar in the center of the wound, erythematous peri wound, will try topical tx with Santyl and Bactroban ointment, may consider ABT for comfort measures.     Hx of dementia, off Aricept and Namenda, resides in SNF for care needs. Blood pressure is controlled on Metoprolol and Lisinopril. Post op anemia, off Fe, last Hgb 10.6 11/03/15. Healed s/p left hip ORIF Past Medical History:  Diagnosis Date  . Atrial fibrillation ([place])   . Bradycardia   . Dementia   . Depression   . Diabetes mellitus without complication ([place])   . Fall at nursing home   . Glaucoma   . HOH (hard of  hearing)   . Hypertension   . Lower back pain   . Melanoma ([facility]) 1960   left shoulder skin  . Muscle pain   . Peripheral neuropathy ([place])   . TBI (traumatic brain injury) ([place])   . Umbilical hernia   . Wears glasses    Past Surgical History:  Procedure Laterality Date  . FRACTURE SURGERY Left 08/22/2015   hip  . INTRAMEDULLARY (IM) NAIL INTERTROCHANTERIC Left 08/22/2015   Procedure: INTRAMEDULLARY (IM) NAIL INTERTROCHANTRIC;  Surgeon: Rod Can, MD;  Location: [facility];  Service: Orthopedics;  Laterality: Left;  . SKIN CANCER EXCISION Left 1960's   melanoma shoulder    Allergies  Allergen Reactions  . Codeine Other (See Comments)    Per [facility]      Medication List       Accurate as of 11/29/15  3:18 PM. Always use your most recent med list.          acetaminophen 325 MG tablet Commonly known as:  TYLENOL Take 650 mg by mouth every 4 (four) hours as needed for moderate pain.   aspirin 81 MG EC tablet Take 1 tablet (81 mg total) by mouth 2 (two) times daily with a meal.   CERTAVITE/ANTIOXIDANTS PO Take 1 tablet by mouth daily.   donepezil 10 MG tablet Commonly known as:  ARICEPT Take 10 mg by mouth at bedtime.   feeding supplement Liqd Take 1 Container by mouth 2 (two) times daily between meals. Increase to 120  ml three time daily.   ferrous sulfate 325 (65 FE) MG tablet Take one tablet twice daily to correct anemia   FIBER-CAPS PO Take 1 capsule by mouth daily.   ipratropium-albuterol 0.5-2.5 (3) MG/3ML Soln Commonly known as:  DUONEB Take 3 mLs by nebulization 2 (two) times daily as needed (for Shortness of breath).   lisinopril 40 MG tablet Commonly known as:  PRINIVIL,ZESTRIL Take 40 mg by mouth daily. For HTN   memantine 10 MG tablet Commonly known as:  NAMENDA Take 10 mg by mouth 2 (two) times daily.   metoprolol succinate 100 MG 24 hr tablet Commonly known as:  TOPROL-XL Take 100 mg by mouth daily. Take with or immediately following a meal.    PROSTATE PO Take by mouth. Take 30 ml twice daily to promote wound healing may mix with 4-8 oz fluid   sennosides-docusate sodium 8.6-50 MG tablet Commonly known as:  SENOKOT-S Take 1 tablet by mouth daily. Hold for loose stool   traMADol 50 MG tablet Commonly known as:  ULTRAM Take 50 mg by mouth every 8 (eight) hours as needed.   zinc sulfate 220 (50 Zn) MG capsule TAKE ONE CAPSULE BY MOUTH EVERY DAY TIMES 14 DAYS       Review of Systems  Constitutional: Positive for activity change, fatigue and unexpected weight change. Negative for appetite change.  HENT: Negative.   Eyes: Positive for visual disturbance (corrective lenses).  Respiratory: Negative.   Cardiovascular: Positive for leg swelling. Negative for chest pain and palpitations.       Left foot edema, trace  Gastrointestinal: Negative.   Endocrine: Positive for polyuria.       Diabetic with neuropathy  Genitourinary: Positive for frequency. Negative for dysuria, flank pain and vaginal bleeding.       Episodes of incontinence.   Musculoskeletal: Positive for gait problem.  Skin:       Left hip surgical incisions healed,  Left heel pressure ulcer relapsed, eschar in the center of the wound, erythematous peri wound.   Allergic/Immunologic: Negative.   Neurological: Positive for light-headedness. Negative for tremors, seizures, syncope, facial asymmetry, speech difficulty, weakness, numbness and headaches.       Dementia  Hematological: Bruises/bleeds easily.  Psychiatric/Behavioral: Positive for confusion, hallucinations and sleep disturbance.    Immunization History  Administered Date(s) Administered  . Influenza-Unspecified 10/27/2013, 10/28/2014, 11/04/2015  . PPD Test 11/17/2011, 11/16/2014, 08/24/2015  . Pneumococcal Polysaccharide-23 11/23/1999  . Td 10/22/2000   Pertinent  Health Maintenance Due  Topic Date Due  . OPHTHALMOLOGY EXAM  09/24/1930  . DEXA SCAN  09/23/1985  . PNA vac Low Risk Adult (2 of 2  - PCV13) 11/22/2000  . FOOT EXAM  10/26/2015  . HEMOGLOBIN A1C  02/29/2016  . INFLUENZA VACCINE  Completed   Fall Risk  09/09/2015 11/29/2014 11/09/2014 10/26/2014 10/27/2013  Falls in the past year? Yes No No No No  Injury with Fall? Yes - - - -  Risk for fall due to : - - - - Impaired mobility;Impaired balance/gait;Mental status change   Functional Status Survey:    Vitals:   11/29/15 1419  BP: (!) 142/84  Pulse: 78  Resp: 16  Temp: 98.9 F (37.2 C)  SpO2: 97%  Weight: 109 lb 12.8 oz (49.8 kg)  Height: 4\' 11"  (1.499 m)   Body mass index is 22.18 kg/m. Physical Exam  Constitutional: She appears well-developed and well-nourished.  HENT:  Head: Normocephalic and atraumatic.  Right Ear: External ear normal.  Left Ear: External ear normal.  Eyes: Conjunctivae and EOM are normal. Pupils are equal, round, and reactive to light.  Neck: Neck supple. No JVD present. No tracheal deviation present. No thyromegaly present.  Cardiovascular: Normal rate.   Murmur heard.  Systolic murmur is present with a grade of 2/6  Pulmonary/Chest: Effort normal and breath sounds normal. No respiratory distress. She has no rales.  Abdominal: Soft. Bowel sounds are normal. She exhibits no mass. There is no tenderness.  Genitourinary: Vagina normal.  Musculoskeletal: She exhibits edema. She exhibits no tenderness.  Recent fracture of the left hip. Unstable gait. Using rolling walker. Mild scoliosis of thoracic. Trace edema left foot.   Lymphadenopathy:    She has no cervical adenopathy.  Neurological: She is alert. She has normal reflexes. No cranial nerve deficit. She exhibits normal muscle tone. Coordination normal.  Demented  Skin: Skin is warm and dry. No rash noted. No erythema.  Left hip surgical incisions healed,  Left heel pressure ulcer relapsed, eschar in the center of the wound, erythematous peri wound.    Psychiatric: She has a normal mood and affect. Her behavior is normal. Her speech is  not delayed and not slurred. She is not agitated, not aggressive, not hyperactive, not slowed, not withdrawn, not actively hallucinating and not combative. Thought content is not paranoid and not delusional. Cognition and memory are impaired. She exhibits abnormal recent memory.    Labs reviewed:  Recent Labs  08/22/15 0429 08/22/15 1020 08/23/15 0514 08/23/15 0730 08/24/15 0543 08/29/15 09/09/15  NA 137  --  137  --  137 141 137  K 4.3  --  3.0*  --  3.7 3.8 4.4  CL 107  --  106  --  107  --   --   CO2 23  --  21*  --  23  --   --   GLUCOSE 105*  --  157*  --  145*  --   --   BUN 13  --  11  --  19 22* 26*  CREATININE 0.66  --  0.86  --  0.76 0.6 0.9  CALCIUM 8.5*  --  8.2*  --  8.4*  --   --   MG  --  1.6*  --  1.8  --   --   --     Recent Labs  08/21/15 1209 08/22/15 0429 08/29/15 09/09/15  AST 20 15 17 14   ALT 17 13* 13 14  ALKPHOS 66 51 50 75  BILITOT 0.7 1.2  --   --   PROT 7.2 5.9*  --   --   ALBUMIN 4.3 3.6  --   --     Recent Labs  08/21/15 1209 08/22/15 0429 08/23/15 0514 08/24/15 0543 08/24/15 1433  08/31/15 09/09/15 09/12/15  WBC 12.0* 8.0 13.5* 8.9 10.8*  < > 7.5 6.8 5.1  NEUTROABS 10.6* 5.4  --   --   --   --   --   --   --   HGB 13.9 11.5* 9.0* 7.9* 9.5*  < > 9.5* 10.1* 10.8*  HCT 43.0 34.6* 28.0* 24.3* 29.4*  < > 29* 31* 33*  MCV 84.5 83.6 84.6 83.2 85.0  --   --   --   --   PLT 177 160 131* 122* 119*  < > 276 323 322  < > = values in this interval not displayed. Lab Results  Component Value Date   TSH 0.88 05/05/2015  Lab Results  Component Value Date   HGBA1C 5.7 08/29/2015   Lab Results  Component Value Date   CHOL 191 05/20/2012   HDL 38 (L) 05/20/2012   LDLCALC 118 (H) 05/20/2012   TRIG 174 (H) 05/20/2012   CHOLHDL 5.0 05/20/2012    Significant Diagnostic Results in last 30 days:  No results found.  Assessment/Plan Infected pressure ulcer, unspecified pressure ulcer stage Lefft heel pressure ulcer, infected, eschar in the  center of the wound, erythematous peri wound, will try topical tx with Santyl and Bactroban ointment, may consider ABT for comfort measures.    HTN (hypertension) controlled, continue Lisinopril 40mg , Metoprolol succinate 100mg  daily. 11/03/15 Na 141, K 3.2, Bun 15, creat 0.61, K supplement started.      Constipation Stable, contineu Fiber Laxative daily, Senna S I bid.    Type 2 diabetes, controlled, with neuropathy ([place]) Hgb a1c 5.7 08/30/15, continue diet control  Dementia Gradual decline, 10/25/15 dc''d  Namenda and Aricept, SNF for care needs.     Hypertonicity of bladder Adult brief for incontinent care  Acute blood loss anemia 09/09/15 Hgb 10.1, Na 137, K 4.4, Bun 26, creat 0.88 09/12/15 Hgb 10.8 10/25/15 dc Fe 11/03/15 Hgb 10.6  Lumbago Managing pain with prn Morphine.   Adult failure to thrive Continue comfort measures, Hospice service.      Family/ staff Communication: continue SNF, Hospice Service   Labs/tests ordered:  none

## 2015-11-29 NOTE — Assessment & Plan Note (Signed)
Gradual decline, 10/25/15 dc''d  Namenda and Aricept, SNF for care needs.

## 2015-11-29 NOTE — Assessment & Plan Note (Signed)
09/09/15 Hgb 10.1, Na 137, K 4.4, Bun 26, creat 0.88 09/12/15 Hgb 10.8 10/25/15 dc Fe 11/03/15 Hgb 10.6

## 2015-11-29 NOTE — Assessment & Plan Note (Signed)
Hgb a1c 5.7 08/30/15, continue diet control

## 2015-12-14 ENCOUNTER — Telehealth: Payer: Self-pay

## 2015-12-14 NOTE — Telephone Encounter (Signed)
Message left on triage voicemail: Due to some upcoming changes with medicare patient will need an order to continue with bed rails.   [name] daughter would like for the provider to write an order and call her at [phone]  Message sent to Man X and her assistant at [place]

## 2015-12-20 NOTE — Telephone Encounter (Signed)
Still awaiting response

## 2015-12-23 ENCOUNTER — Other Ambulatory Visit: Payer: Self-pay | Admitting: *Deleted

## 2015-12-23 MED ORDER — AMBULATORY NON FORMULARY MEDICATION: 0 refills | Status: AC

## 2015-12-23 NOTE — Telephone Encounter (Signed)
[facility]

## 2016-01-23 DEATH — deceased

## 2016-04-24 NOTE — Telephone Encounter (Signed)
This is a message from 4 months ago that was just routed to me! I suspect Manxie has already taken care of it, but if not, please have her review the situation.
